# Patient Record
Sex: Female | Born: 1950 | Race: White | Hispanic: No | Marital: Married | Smoking: Never smoker
Health system: Southern US, Community
[De-identification: ages and names within clinical notes are randomized; demographics above are authoritative.]

## PROBLEM LIST (undated history)

## (undated) DIAGNOSIS — I10 Essential (primary) hypertension: Secondary | ICD-10-CM

## (undated) DIAGNOSIS — E785 Hyperlipidemia, unspecified: Secondary | ICD-10-CM

## (undated) DIAGNOSIS — G43909 Migraine, unspecified, not intractable, without status migrainosus: Secondary | ICD-10-CM

## (undated) DIAGNOSIS — U071 COVID-19: Secondary | ICD-10-CM

## (undated) DIAGNOSIS — E119 Type 2 diabetes mellitus without complications: Secondary | ICD-10-CM

## (undated) DIAGNOSIS — J45909 Unspecified asthma, uncomplicated: Secondary | ICD-10-CM

## (undated) DIAGNOSIS — J40 Bronchitis, not specified as acute or chronic: Secondary | ICD-10-CM

## (undated) HISTORY — PX: NO PAST SURGERIES: SHX2092

## (undated) HISTORY — PX: CHOLECYSTECTOMY: SHX55

---

## 2004-01-04 ENCOUNTER — Emergency Department: Payer: Self-pay | Admitting: Emergency Medicine

## 2004-03-28 ENCOUNTER — Emergency Department: Payer: Self-pay | Admitting: Emergency Medicine

## 2004-03-30 ENCOUNTER — Emergency Department: Payer: Self-pay | Admitting: General Practice

## 2004-08-17 ENCOUNTER — Other Ambulatory Visit: Payer: Self-pay

## 2004-08-17 ENCOUNTER — Inpatient Hospital Stay: Payer: Self-pay | Admitting: Internal Medicine

## 2004-08-18 ENCOUNTER — Other Ambulatory Visit: Payer: Self-pay

## 2004-08-24 ENCOUNTER — Ambulatory Visit: Payer: Self-pay | Admitting: Cardiovascular Disease

## 2005-04-29 ENCOUNTER — Emergency Department: Payer: Self-pay | Admitting: Emergency Medicine

## 2005-05-07 ENCOUNTER — Ambulatory Visit: Payer: Self-pay | Admitting: Unknown Physician Specialty

## 2005-10-14 ENCOUNTER — Emergency Department: Payer: Self-pay | Admitting: General Practice

## 2005-11-03 ENCOUNTER — Emergency Department: Payer: Self-pay | Admitting: Emergency Medicine

## 2006-08-02 ENCOUNTER — Inpatient Hospital Stay: Payer: Self-pay | Admitting: Internal Medicine

## 2006-08-04 ENCOUNTER — Other Ambulatory Visit: Payer: Self-pay

## 2006-08-14 ENCOUNTER — Ambulatory Visit: Payer: Self-pay | Admitting: Internal Medicine

## 2006-08-16 ENCOUNTER — Ambulatory Visit: Payer: Self-pay | Admitting: Internal Medicine

## 2006-08-29 ENCOUNTER — Ambulatory Visit: Payer: Self-pay | Admitting: Internal Medicine

## 2006-10-26 ENCOUNTER — Emergency Department: Payer: Self-pay | Admitting: Emergency Medicine

## 2006-12-14 ENCOUNTER — Emergency Department: Payer: Self-pay | Admitting: Emergency Medicine

## 2007-02-02 ENCOUNTER — Emergency Department: Payer: Self-pay | Admitting: Internal Medicine

## 2007-06-05 ENCOUNTER — Other Ambulatory Visit: Payer: Self-pay

## 2007-06-05 ENCOUNTER — Ambulatory Visit: Payer: Self-pay | Admitting: Ophthalmology

## 2007-06-06 ENCOUNTER — Ambulatory Visit: Payer: Self-pay | Admitting: Ophthalmology

## 2007-09-26 ENCOUNTER — Emergency Department: Payer: Self-pay | Admitting: Emergency Medicine

## 2007-10-14 ENCOUNTER — Emergency Department: Payer: Self-pay | Admitting: Emergency Medicine

## 2008-01-05 ENCOUNTER — Emergency Department: Payer: Self-pay | Admitting: Emergency Medicine

## 2008-01-10 ENCOUNTER — Emergency Department: Payer: Self-pay | Admitting: Emergency Medicine

## 2008-04-19 ENCOUNTER — Emergency Department: Payer: Self-pay | Admitting: Emergency Medicine

## 2008-05-09 ENCOUNTER — Emergency Department: Payer: Self-pay | Admitting: Emergency Medicine

## 2008-05-25 ENCOUNTER — Emergency Department: Payer: Self-pay | Admitting: Unknown Physician Specialty

## 2008-09-11 ENCOUNTER — Emergency Department: Payer: Self-pay | Admitting: Internal Medicine

## 2008-12-18 ENCOUNTER — Emergency Department: Payer: Self-pay | Admitting: Emergency Medicine

## 2009-10-27 ENCOUNTER — Emergency Department: Payer: Self-pay | Admitting: Emergency Medicine

## 2009-12-25 ENCOUNTER — Emergency Department: Payer: Self-pay | Admitting: Emergency Medicine

## 2009-12-31 ENCOUNTER — Emergency Department: Payer: Self-pay | Admitting: Emergency Medicine

## 2010-05-30 DEATH — deceased

## 2010-06-26 ENCOUNTER — Emergency Department: Payer: Self-pay | Admitting: Emergency Medicine

## 2010-09-10 ENCOUNTER — Emergency Department: Payer: Self-pay | Admitting: Emergency Medicine

## 2010-11-28 ENCOUNTER — Emergency Department: Payer: Self-pay | Admitting: Emergency Medicine

## 2010-12-16 ENCOUNTER — Emergency Department: Payer: Self-pay | Admitting: Emergency Medicine

## 2011-11-13 ENCOUNTER — Emergency Department: Payer: Self-pay | Admitting: Emergency Medicine

## 2011-11-13 LAB — COMPREHENSIVE METABOLIC PANEL
Albumin: 3.3 g/dL — ABNORMAL LOW (ref 3.4–5.0)
Alkaline Phosphatase: 131 U/L (ref 50–136)
Anion Gap: 10 (ref 7–16)
BUN: 20 mg/dL — ABNORMAL HIGH (ref 7–18)
Bilirubin,Total: 0.2 mg/dL (ref 0.2–1.0)
Calcium, Total: 9.3 mg/dL (ref 8.5–10.1)
Chloride: 100 mmol/L (ref 98–107)
Co2: 25 mmol/L (ref 21–32)
EGFR (African American): >60
EGFR (Non-African Amer.): >60
Glucose: 382 mg/dL — ABNORMAL HIGH (ref 65–99)
Osmolality: 288 (ref 275–301)
SGOT(AST): 67 U/L — ABNORMAL HIGH (ref 15–37)
Sodium: 135 mmol/L — ABNORMAL LOW (ref 136–145)
Total Protein: 7.3 g/dL (ref 6.4–8.2)

## 2011-11-13 LAB — LIPASE, BLOOD: Lipase: 172 U/L (ref 73–393)

## 2011-11-14 LAB — URINALYSIS, COMPLETE
Blood: NEGATIVE
Ph: 6 (ref 4.5–8.0)
Protein: 30
Squamous Epithelial: 1
WBC UR: 34 /HPF (ref 0–5)

## 2011-11-14 LAB — CBC
HCT: 45.3 % (ref 35.0–47.0)
MCH: 31.8 pg (ref 26.0–34.0)
MCHC: 34.4 g/dL (ref 32.0–36.0)
MCV: 92 fL (ref 80–100)
Platelet: 249 10*3/uL (ref 150–440)
RDW: 12.6 % (ref 11.5–14.5)

## 2012-01-31 ENCOUNTER — Emergency Department: Payer: Self-pay | Admitting: Emergency Medicine

## 2012-01-31 LAB — COMPREHENSIVE METABOLIC PANEL
Albumin: 3.4 g/dL (ref 3.4–5.0)
Anion Gap: 8 (ref 7–16)
BUN: 10 mg/dL (ref 7–18)
Calcium, Total: 9.3 mg/dL (ref 8.5–10.1)
Chloride: 99 mmol/L (ref 98–107)
Co2: 26 mmol/L (ref 21–32)
EGFR (African American): >60
Osmolality: 278 (ref 275–301)
Potassium: 4 mmol/L (ref 3.5–5.1)
SGOT(AST): 119 U/L — ABNORMAL HIGH (ref 15–37)
Sodium: 133 mmol/L — ABNORMAL LOW (ref 136–145)

## 2012-10-24 ENCOUNTER — Emergency Department: Payer: Self-pay | Admitting: Emergency Medicine

## 2012-10-24 LAB — URINALYSIS, COMPLETE
Bilirubin,UR: NEGATIVE
Blood: NEGATIVE
Glucose,UR: 50 mg/dL (ref 0–75)
Ketone: NEGATIVE
Nitrite: NEGATIVE
Protein: 30
Specific Gravity: 1.023 (ref 1.003–1.030)
Squamous Epithelial: 6

## 2013-10-01 ENCOUNTER — Emergency Department: Payer: Self-pay | Admitting: Emergency Medicine

## 2013-10-01 LAB — CBC WITH DIFFERENTIAL/PLATELET
BASOS ABS: 0.1 10*3/uL (ref 0.0–0.1)
BASOS PCT: 0.8 %
Eosinophil #: 0.6 10*3/uL (ref 0.0–0.7)
Eosinophil %: 5 %
HCT: 39.8 % (ref 35.0–47.0)
HGB: 13.1 g/dL (ref 12.0–16.0)
LYMPHS PCT: 25.4 %
Lymphocyte #: 2.9 10*3/uL (ref 1.0–3.6)
MCH: 30.3 pg (ref 26.0–34.0)
MCHC: 32.8 g/dL (ref 32.0–36.0)
MCV: 92 fL (ref 80–100)
MONO ABS: 0.8 x10 3/mm (ref 0.2–0.9)
Monocyte %: 7.2 %
Neutrophil #: 7 10*3/uL — ABNORMAL HIGH (ref 1.4–6.5)
Neutrophil %: 61.6 %
PLATELETS: 268 10*3/uL (ref 150–440)
RBC: 4.31 10*6/uL (ref 3.80–5.20)
RDW: 13.1 % (ref 11.5–14.5)
WBC: 11.4 10*3/uL — ABNORMAL HIGH (ref 3.6–11.0)

## 2013-10-01 LAB — BASIC METABOLIC PANEL
Anion Gap: 10 (ref 7–16)
BUN: 12 mg/dL (ref 7–18)
CO2: 22 mmol/L (ref 21–32)
Calcium, Total: 9 mg/dL (ref 8.5–10.1)
Chloride: 106 mmol/L (ref 98–107)
Creatinine: 1.08 mg/dL (ref 0.60–1.30)
EGFR (Non-African Amer.): 55 — ABNORMAL LOW
Glucose: 187 mg/dL — ABNORMAL HIGH (ref 65–99)
OSMOLALITY: 280 (ref 275–301)
POTASSIUM: 3.8 mmol/L (ref 3.5–5.1)
SODIUM: 138 mmol/L (ref 136–145)

## 2013-10-01 LAB — TROPONIN I: Troponin-I: <0.02 ng/mL

## 2013-10-02 LAB — URINALYSIS, COMPLETE
BLOOD: NEGATIVE
Bilirubin,UR: NEGATIVE
Glucose,UR: NEGATIVE mg/dL (ref 0–75)
Ketone: NEGATIVE
Nitrite: NEGATIVE
Ph: 5 (ref 4.5–8.0)
Protein: 100
Specific Gravity: 1.029 (ref 1.003–1.030)
Squamous Epithelial: 46
WBC UR: 66 /HPF (ref 0–5)

## 2013-10-02 LAB — TROPONIN I: Troponin-I: <0.02 ng/mL

## 2014-03-06 ENCOUNTER — Ambulatory Visit: Payer: Self-pay | Admitting: Ophthalmology

## 2014-03-06 LAB — POTASSIUM: Potassium: 4.3 mmol/L (ref 3.5–5.1)

## 2014-03-18 ENCOUNTER — Ambulatory Visit: Payer: Self-pay | Admitting: Ophthalmology

## 2014-06-29 NOTE — Op Note (Signed)
PATIENT NAME:  [name], Wendy Summers DATE OF BIRTH:  12-May-1950  DATE OF PROCEDURE:  03/18/2014  PREOPERATIVE DIAGNOSIS:  Nuclear sclerotic cataract of the left eye.   POSTOPERATIVE DIAGNOSIS:  Nuclear sclerotic cataract of the left eye.   OPERATIVE PROCEDURE:  Cataract extraction by phacoemulsification with implant of intraocular lens to left eye.   SURGEON:  Galen ManilaWilliam Shandee Jergens, MD.   ANESTHESIA:  1. Managed anesthesia care.  2. Topical tetracaine drops followed by 2% Xylocaine jelly applied in the preoperative holding area.   COMPLICATIONS:  None.   TECHNIQUE:   Stop and chop.  DESCRIPTION OF PROCEDURE:  The patient was examined and consented in the preoperative holding area where the aforementioned topical anesthesia was applied to the left eye and then brought back to the Operating Room where the left eye was prepped and draped in the usual sterile ophthalmic fashion and a lid speculum was placed. A paracentesis was created with the side port blade and the anterior chamber was filled with viscoelastic. A near clear corneal incision was performed with the steel keratome. A continuous curvilinear capsulorrhexis was performed with a cystotome followed by the capsulorrhexis forceps. Hydrodissection and hydrodelineation were carried out with BSS on a blunt cannula. The lens was removed in a stop and chop technique and the remaining cortical material was removed with the irrigation-aspiration handpiece. The capsular bag was inflated with viscoelastic and the Tecnis ZCB00, 31.5-diopter lens, serial number [id number] was placed in the capsular bag without complication. The remaining viscoelastic was removed from the eye with the irrigation-aspiration handpiece. The wounds were hydrated. The anterior chamber was flushed with Miostat and the eye was inflated to physiologic pressure. 0.1 mL of cefuroxime concentration 10 mg/mL was placed in the anterior chamber. The wounds were found to be water  tight. The eye was dressed with Vigamox. The patient was given protective glasses to wear throughout the day and a shield with which to sleep tonight. The patient was also given drops with which to begin a drop regimen today and will follow-up with me in one day.    ____________________________ Wendy Summers. Kaiser Belluomini, MD wlp:TT D: 03/18/2014 20:53:49 ET T: 03/18/2014 23:07:53 ET JOB#: [id number]  cc: Britton Perkinson Summers. Eular Panek, MD, <Dictator> Wendy Summers Evanee Lubrano MD ELECTRONICALLY SIGNED 03/19/2014 16:10

## 2015-03-10 ENCOUNTER — Emergency Department: Payer: Medicaid Other

## 2015-03-10 ENCOUNTER — Emergency Department
Admission: EM | Admit: 2015-03-10 | Discharge: 2015-03-10 | Disposition: A | Discharge status: Home / Self Care | Payer: Medicaid Other | Attending: Emergency Medicine | Admitting: Emergency Medicine

## 2015-03-10 ENCOUNTER — Encounter: Payer: Self-pay | Admitting: *Deleted

## 2015-03-10 DIAGNOSIS — J45901 Unspecified asthma with (acute) exacerbation: Secondary | ICD-10-CM | POA: Insufficient documentation

## 2015-03-10 DIAGNOSIS — R05 Cough: Secondary | ICD-10-CM | POA: Diagnosis present

## 2015-03-10 DIAGNOSIS — J209 Acute bronchitis, unspecified: Secondary | ICD-10-CM

## 2015-03-10 DIAGNOSIS — E119 Type 2 diabetes mellitus without complications: Secondary | ICD-10-CM | POA: Insufficient documentation

## 2015-03-10 HISTORY — DX: Unspecified asthma, uncomplicated: J45.909

## 2015-03-10 HISTORY — DX: Type 2 diabetes mellitus without complications: E11.9

## 2015-03-10 LAB — CBC
HCT: 38 % (ref 35.0–47.0)
HEMOGLOBIN: 12.5 g/dL (ref 12.0–16.0)
MCH: 30 pg (ref 26.0–34.0)
MCHC: 32.9 g/dL (ref 32.0–36.0)
MCV: 91.1 fL (ref 80.0–100.0)
PLATELETS: 241 10*3/uL (ref 150–440)
RBC: 4.17 MIL/uL (ref 3.80–5.20)
RDW: 13.9 % (ref 11.5–14.5)
WBC: 12.1 10*3/uL — ABNORMAL HIGH (ref 3.6–11.0)

## 2015-03-10 LAB — COMPREHENSIVE METABOLIC PANEL
ALBUMIN: 4.3 g/dL (ref 3.5–5.0)
ALK PHOS: 91 U/L (ref 38–126)
ALT: 16 U/L (ref 14–54)
ANION GAP: 10 (ref 5–15)
AST: 14 U/L — ABNORMAL LOW (ref 15–41)
BILIRUBIN TOTAL: 0.6 mg/dL (ref 0.3–1.2)
BUN: 22 mg/dL — AB (ref 6–20)
CALCIUM: 9.3 mg/dL (ref 8.9–10.3)
CO2: 24 mmol/L (ref 22–32)
CREATININE: 0.93 mg/dL (ref 0.44–1.00)
Chloride: 104 mmol/L (ref 101–111)
GFR calc Af Amer: >60 mL/min (ref >60)
GFR calc non Af Amer: >60 mL/min (ref >60)
GLUCOSE: 220 mg/dL — AB (ref 65–99)
Potassium: 4.4 mmol/L (ref 3.5–5.1)
Sodium: 138 mmol/L (ref 135–145)
TOTAL PROTEIN: 7.4 g/dL (ref 6.5–8.1)

## 2015-03-10 LAB — LIPASE, BLOOD: Lipase: 27 U/L (ref 11–51)

## 2015-03-10 MED ORDER — AZITHROMYCIN 250 MG PO TABS: ORAL_TABLET | ORAL | Status: DC

## 2015-03-10 MED ORDER — DEXAMETHASONE SODIUM PHOSPHATE 10 MG/ML IJ SOLN
10.0000 mg | Freq: Once | INTRAMUSCULAR | Status: AC
  Administered 2015-03-10: 10 mg via INTRAMUSCULAR
  Filled 2015-03-10: qty 1

## 2015-03-10 MED ORDER — RANITIDINE HCL 150 MG PO CAPS: 150.0000 mg | ORAL_CAPSULE | Freq: Two times a day (BID) | ORAL | Status: DC

## 2015-03-10 MED ORDER — AZITHROMYCIN 250 MG PO TABS
500.0000 mg | ORAL_TABLET | Freq: Once | ORAL | Status: AC
  Administered 2015-03-10: 500 mg via ORAL
  Filled 2015-03-10: qty 2

## 2015-03-10 MED ORDER — PROMETHAZINE HCL 25 MG PO TABS: 25.0000 mg | ORAL_TABLET | Freq: Four times a day (QID) | ORAL | Status: DC | PRN

## 2015-03-10 MED ORDER — IPRATROPIUM-ALBUTEROL 0.5-2.5 (3) MG/3ML IN SOLN
3.0000 mL | Freq: Once | RESPIRATORY_TRACT | Status: AC
  Administered 2015-03-10: 3 mL via RESPIRATORY_TRACT
  Filled 2015-03-10: qty 3

## 2015-03-10 MED ORDER — ACETAMINOPHEN 500 MG PO TABS
1000.0000 mg | ORAL_TABLET | Freq: Once | ORAL | Status: AC
  Administered 2015-03-10: 1000 mg via ORAL
  Filled 2015-03-10: qty 2

## 2015-03-10 MED ORDER — ONDANSETRON 8 MG PO TBDP
8.0000 mg | ORAL_TABLET | Freq: Once | ORAL | Status: AC
  Administered 2015-03-10: 8 mg via ORAL
  Filled 2015-03-10: qty 1

## 2015-03-10 NOTE — ED Provider Notes (Addendum)
[facility] Emergency Department Provider Note  ____________________________________________  Time seen: 4:45 PM  I have reviewed the triage vital signs and the nursing notes.   HISTORY  Chief Complaint Cough; Nausea; and Shortness of Breath    HPI Wendy Summers is a 65 y.o. female who reports she was in her usual state of health last night when she went to bed when she woke up this morning with a gradual onset headache. She also has some shortness of breath as well as chest wall discomfort and nonproductive cough. She feels tired and nauseated as well. Only medical history is asthma and diabetes that is controlled with oral hypoglycemics. She checks her blood sugar at home. Denies fever or diarrhea abdominal pain or significant chest pain. No dizziness or syncope.     Past Medical History  Diagnosis Date  . Diabetes mellitus without complication (HCC)   . Asthma      There are no active problems to display for this patient.    History reviewed. No pertinent past surgical history.   Current Outpatient Rx  Name  Route  Sig  Dispense  Refill  . azithromycin (ZITHROMAX Z-PAK) 250 MG tablet      Take 2 tablets (500 mg) on  Day 1,  followed by 1 tablet (250 mg) once daily on Days 2 through 5.   6 each   0   . promethazine (PHENERGAN) 25 MG tablet   Oral   Take 1 tablet (25 mg total) by mouth every 6 (six) hours as needed for nausea or vomiting.   15 tablet   0   . ranitidine (ZANTAC) 150 MG capsule   Oral   Take 1 capsule (150 mg total) by mouth 2 (two) times daily.   28 capsule   0      Allergies Review of patient's allergies indicates no known allergies.   History reviewed. No pertinent family history.  Social History Social History  Substance Use Topics  . Smoking status: Never Smoker   . Smokeless tobacco: None  . Alcohol Use: None    Review of Systems  Constitutional:   No fever or chills. No weight changes Eyes:   No  blurry vision or double vision.  ENT:   Positive sore throat. Cardiovascular:   No chest pain. Respiratory:   Positive shortness of breath and nonproductive cough Gastrointestinal:   Negative for abdominal pain, vomiting and diarrhea.  No BRBPR or melena. Genitourinary:   Negative for dysuria, urinary retention, bloody urine, or difficulty urinating. Musculoskeletal:   Negative for back pain. No joint swelling or pain. Skin:   Negative for rash. Neurological:   Negative for headaches, focal weakness or numbness. Psychiatric:  No anxiety or depression.   Endocrine:  No hot/cold intolerance, changes in energy, or sleep difficulty.  10-point ROS otherwise negative.  ____________________________________________   PHYSICAL EXAM:  VITAL SIGNS: ED Triage Vitals  Enc Vitals Group     BP 03/10/15 1526 159/80 mmHg     Pulse Rate 03/10/15 1526 87     Resp 03/10/15 1526 18     Temp 03/10/15 1526 97.6 F (36.4 C)     Temp Source 03/10/15 1526 Oral     SpO2 03/10/15 1526 96 %     Weight 03/10/15 1526 210 lb (95.255 kg)     Height 03/10/15 1526 5\' 2"  (1.575 m)     Head Cir --      Peak Flow --  Pain Score 03/10/15 1527 10     Pain Loc --      Pain Edu? --      Excl. in GC? --     Vital signs reviewed, nursing assessments reviewed.   Constitutional:   Alert and oriented. Well appearing and in no distress. Eyes:   No scleral icterus. No conjunctival pallor. PERRL. EOMI ENT   Head:   Normocephalic and atraumatic.   Nose:   No congestion/rhinnorhea. No septal hematoma   Mouth/Throat:   MMM, positive pharyngeal erythema. No peritonsillar mass. No uvula shift.   Neck:   No stridor. No SubQ emphysema. No meningismus. Hematological/Lymphatic/Immunilogical:   No cervical lymphadenopathy. Cardiovascular:   RRR. Normal and symmetric distal pulses are present in all extremities. No murmurs, rubs, or gallops. Respiratory:   Normal respiratory effort without tachypnea nor  retractions. Diffuse expiratory wheezing with normal expiratory phase Gastrointestinal:   Soft and nontender. No distention. There is no CVA tenderness.  No rebound, rigidity, or guarding. Genitourinary:   deferred Musculoskeletal:   Nontender with normal range of motion in all extremities. No joint effusions.  No lower extremity tenderness.  No edema. There is tenderness to palpation along the anterior chest wall particularly over the sternum which reproduces the chest wall pain that she had previously. Neurologic:   Normal speech and language.  CN 2-10 normal. Motor grossly intact. No pronator drift.  Normal gait. No gross focal neurologic deficits are appreciated.  Skin:    Skin is warm, dry and intact. No rash noted.  No petechiae, purpura, or bullae. Psychiatric:   Mood and affect are normal. Speech and behavior are normal. Patient exhibits appropriate insight and judgment.  ____________________________________________    LABS (pertinent positives/negatives) (all labs ordered are listed, but only abnormal results are displayed) Labs Reviewed  COMPREHENSIVE METABOLIC PANEL - Abnormal; Notable for the following:    Glucose, Bld 220 (*)    BUN 22 (*)    AST 14 (*)    All other components within normal limits  CBC - Abnormal; Notable for the following:    WBC 12.1 (*)    All other components within normal limits  LIPASE, BLOOD   ____________________________________________   EKG  Interpreted by me Normal sinus rhythm rate of 87, normal axis and intervals. Incomplete right bundle-branch block. Normal ST segments and T waves.  ____________________________________________    RADIOLOGY  Chest x-ray unremarkable except for airway changes consistent with bronchitis  ____________________________________________   PROCEDURES   ____________________________________________   INITIAL IMPRESSION / ASSESSMENT AND PLAN / ED COURSE  Pertinent labs & imaging results that were  available during my care of the patient were reviewed by me and considered in my medical decision making (see chart for details).  Patient presents with constellation of symptoms consistent with viral respiratory illness and acute bronchitis. Considering the patient's symptoms, medical history, and physical examination today, I have low suspicion for ACS, PE, TAD, pneumothorax, carditis, mediastinitis, pneumonia, CHF, or sepsis.  We'll give Decadron now. Counseled the patient to continue monitoring her blood sugar at home with her diabetes. We'll also give him a DuoNeb as well as Tylenol and Zofran. I'll prescribe a course of azithromycin and antacids and anti-emetics and have her follow up with primary care.         ____________________________________________   FINAL CLINICAL IMPRESSION(S) / ED DIAGNOSES  Final diagnoses:  Acute bronchitis, unspecified organism     Sharman Cheek, MD 03/10/15 1656

## 2015-03-10 NOTE — Discharge Instructions (Signed)

## 2015-03-10 NOTE — ED Notes (Signed)
MD Stafford at bedside. 

## 2015-03-10 NOTE — ED Notes (Signed)
Pt states cough, nausea, headache, states she feels SOB when she lies down, started this AM

## 2015-03-27 ENCOUNTER — Other Ambulatory Visit: Payer: Self-pay | Admitting: Family Medicine

## 2015-03-27 ENCOUNTER — Ambulatory Visit
Admission: RE | Admit: 2015-03-27 | Discharge: 2015-03-27 | Disposition: A | Discharge status: Home / Self Care | Payer: Medicaid Other | Source: Ambulatory Visit | Attending: Family Medicine | Admitting: Family Medicine

## 2015-03-27 DIAGNOSIS — M25579 Pain in unspecified ankle and joints of unspecified foot: Secondary | ICD-10-CM

## 2015-03-27 DIAGNOSIS — M25571 Pain in right ankle and joints of right foot: Secondary | ICD-10-CM | POA: Diagnosis not present

## 2015-07-27 ENCOUNTER — Encounter: Payer: Self-pay | Admitting: Emergency Medicine

## 2015-07-27 ENCOUNTER — Emergency Department
Admission: EM | Admit: 2015-07-27 | Discharge: 2015-07-27 | Disposition: A | Discharge status: Home / Self Care | Payer: Medicaid Other | Attending: Emergency Medicine | Admitting: Emergency Medicine

## 2015-07-27 DIAGNOSIS — N390 Urinary tract infection, site not specified: Secondary | ICD-10-CM | POA: Diagnosis not present

## 2015-07-27 DIAGNOSIS — R55 Syncope and collapse: Secondary | ICD-10-CM | POA: Insufficient documentation

## 2015-07-27 DIAGNOSIS — E860 Dehydration: Secondary | ICD-10-CM | POA: Diagnosis not present

## 2015-07-27 DIAGNOSIS — R42 Dizziness and giddiness: Secondary | ICD-10-CM | POA: Diagnosis present

## 2015-07-27 DIAGNOSIS — J45909 Unspecified asthma, uncomplicated: Secondary | ICD-10-CM | POA: Insufficient documentation

## 2015-07-27 DIAGNOSIS — R739 Hyperglycemia, unspecified: Secondary | ICD-10-CM

## 2015-07-27 DIAGNOSIS — E1165 Type 2 diabetes mellitus with hyperglycemia: Secondary | ICD-10-CM | POA: Diagnosis not present

## 2015-07-27 LAB — CBC
HCT: 37 % (ref 35.0–47.0)
Hemoglobin: 12.5 g/dL (ref 12.0–16.0)
MCH: 30.8 pg (ref 26.0–34.0)
MCHC: 33.7 g/dL (ref 32.0–36.0)
MCV: 91.6 fL (ref 80.0–100.0)
PLATELETS: 262 10*3/uL (ref 150–440)
RBC: 4.04 MIL/uL (ref 3.80–5.20)
RDW: 13.6 % (ref 11.5–14.5)
WBC: 9.5 10*3/uL (ref 3.6–11.0)

## 2015-07-27 LAB — URINALYSIS COMPLETE WITH MICROSCOPIC (ARMC ONLY)
BILIRUBIN URINE: NEGATIVE
GLUCOSE, UA: 150 mg/dL — AB
Hgb urine dipstick: NEGATIVE
Ketones, ur: NEGATIVE mg/dL
Nitrite: NEGATIVE
Protein, ur: NEGATIVE mg/dL
Specific Gravity, Urine: 1.015 (ref 1.005–1.030)
pH: 5 (ref 5.0–8.0)

## 2015-07-27 LAB — BASIC METABOLIC PANEL
Anion gap: 11 (ref 5–15)
BUN: 47 mg/dL — AB (ref 6–20)
CALCIUM: 9.1 mg/dL (ref 8.9–10.3)
CHLORIDE: 108 mmol/L (ref 101–111)
CO2: 17 mmol/L — AB (ref 22–32)
CREATININE: 1.58 mg/dL — AB (ref 0.44–1.00)
GFR calc non Af Amer: 34 mL/min — ABNORMAL LOW (ref >60)
GFR, EST AFRICAN AMERICAN: 39 mL/min — AB (ref >60)
Glucose, Bld: 343 mg/dL — ABNORMAL HIGH (ref 65–99)
Potassium: 4.1 mmol/L (ref 3.5–5.1)
SODIUM: 136 mmol/L (ref 135–145)

## 2015-07-27 LAB — GLUCOSE, CAPILLARY: GLUCOSE-CAPILLARY: 92 mg/dL (ref 65–99)

## 2015-07-27 MED ORDER — CEPHALEXIN 500 MG PO CAPS: 500.0000 mg | ORAL_CAPSULE | Freq: Three times a day (TID) | ORAL | Status: DC

## 2015-07-27 MED ORDER — CEPHALEXIN 500 MG PO CAPS
500.0000 mg | ORAL_CAPSULE | Freq: Once | ORAL | Status: AC
  Administered 2015-07-27: 500 mg via ORAL
  Filled 2015-07-27: qty 1

## 2015-07-27 MED ORDER — INSULIN ASPART 100 UNIT/ML ~~LOC~~ SOLN
6.0000 [IU] | Freq: Once | SUBCUTANEOUS | Status: AC
  Administered 2015-07-27: 6 [IU] via INTRAVENOUS
  Filled 2015-07-27: qty 6

## 2015-07-27 MED ORDER — SODIUM CHLORIDE 0.9 % IV BOLUS (SEPSIS)
1000.0000 mL | Freq: Once | INTRAVENOUS | Status: AC
  Administered 2015-07-27: 1000 mL via INTRAVENOUS

## 2015-07-27 NOTE — Discharge Instructions (Signed)
Dehydration, Adult °Dehydration means your body does not have as much fluid or water as it needs. It happens when you take in less fluid than you lose. Your kidneys, brain, and heart will not work properly without the right amount of fluids.  °Dehydration can range from mild to severe. It should be treated right away to help prevent it from becoming severe. °HOME CARE °· Drink enough fluid to keep your pee (urine) clear or pale yellow. °· Drink water or fluid slowly by taking small sips. You can also try sucking on ice cubes. °· Have food or drinks that contain electrolytes. Examples include bananas and sports drinks. °· Take over-the-counter and prescription medicines only as told by your doctor. °· Prepare oral rehydration solution (ORS) according to the instructions that came with it. Take sips of ORS every 5 minutes until your pee returns to normal. °· If you are throwing up (vomiting) or have watery poop (diarrhea), keep trying to drink water, ORS, or both. °· If you have watery poop, avoid: °¨ Drinks with caffeine. °¨ Fruit juice. °¨ Milk. °¨ Carbonated soft drinks. °· Do not take salt tablets. This can lead to having too much sodium in your body (hypernatremia). °GET HELP IF: °· You cannot eat or drink without throwing up. °· You have had mild watery poop for longer than 24 hours. °· You have a fever. °GET HELP RIGHT AWAY IF:  °· You have very strong thirst. °· You have very bad watery poop. °· You have not peed in 6-8 hours, or you have peed only a small amount of very dark pee. °· You have shriveled skin. °· You are dizzy, confused, or both. °  °This information is not intended to replace advice given to you by your health care provider. Make sure you discuss any questions you have with your health care provider. °  °Document Released: 12/11/2008 Document Revised: 11/05/2014 Document Reviewed: 07/02/2014 °Elsevier Interactive Patient Education ©2016 [employer] ° ° °Urinary Tract Infection °A urinary  tract infection (UTI) can occur any place along the urinary tract. The tract includes the kidneys, ureters, bladder, and urethra. A type of germ called bacteria often causes a UTI. UTIs are often helped with antibiotic medicine.  °HOME CARE  °· If given, take antibiotics as told by your doctor. Finish them even if you start to feel better. °· Drink enough fluids to keep your pee (urine) clear or pale yellow. °· Avoid tea, drinks with caffeine, and bubbly (carbonated) drinks. °· Pee often. Avoid holding your pee in for a long time. °· Pee before and after having sex (intercourse). °· Wipe from front to back after you poop (bowel movement) if you are a woman. Use each tissue only once. °GET HELP RIGHT AWAY IF:  °· You have back pain. °· You have lower belly (abdominal) pain. °· You have chills. °· You feel sick to your stomach (nauseous). °· You throw up (vomit). °· Your burning or discomfort with peeing does not go away. °· You have a fever. °· Your symptoms are not better in 3 days. °MAKE SURE YOU:  °· Understand these instructions. °· Will watch your condition. °· Will get help right away if you are not doing well or get worse. °  °This information is not intended to replace advice given to you by your health care provider. Make sure you discuss any questions you have with your health care provider. °  °Document Released: 08/03/2007 Document Revised: 03/07/2014 Document Reviewed: 09/15/2011 °Elsevier   Interactive Patient Education ©2016 [employer] ° °

## 2015-07-27 NOTE — ED Notes (Signed)
Pt to ed with c/o dizziness that started after having neck pain and then diaphoresis.  Pt states started at 2 pm today.

## 2015-07-27 NOTE — ED Provider Notes (Signed)
[facility] Emergency Department Provider Note  Time seen: 5:23 PM  I have reviewed the triage vital signs and the nursing notes.   HISTORY  Chief Complaint Dizziness    HPI Wendy Summers is a 65 y.o. female with a past medical history of diabetes, asthma presents the emergency department with near syncope.  According to the patient she was using the restroom today, when she stood up she got very lightheaded and dizzy. Patient states she has been feeling lightheaded for the past 2 days or so. Denies any dysuria. Denies any chest pain or shortness of breath. Denies any fever, cough or congestion. Patient did state mild neck pain earlier but that has resolved.     Past Medical History  Diagnosis Date  . Diabetes mellitus without complication (HCC)   . Asthma     There are no active problems to display for this patient.   History reviewed. No pertinent past surgical history.  Current Outpatient Rx  Name  Route  Sig  Dispense  Refill  . azithromycin (ZITHROMAX Z-PAK) 250 MG tablet      Take 2 tablets (500 mg) on  Day 1,  followed by 1 tablet (250 mg) once daily on Days 2 through 5.   6 each   0   . promethazine (PHENERGAN) 25 MG tablet   Oral   Take 1 tablet (25 mg total) by mouth every 6 (six) hours as needed for nausea or vomiting.   15 tablet   0   . ranitidine (ZANTAC) 150 MG capsule   Oral   Take 1 capsule (150 mg total) by mouth 2 (two) times daily.   28 capsule   0     Allergies Review of patient's allergies indicates no known allergies.  History reviewed. No pertinent family history.  Social History Social History  Substance Use Topics  . Smoking status: Never Smoker   . Smokeless tobacco: None  . Alcohol Use: No    Review of Systems Constitutional: Negative for fever Cardiovascular: Negative for chest pain. Respiratory: Negative for shortness of breath. Gastrointestinal: Negative for abdominal pain. Negative for nausea,  vomiting. States frequent diarrhea but this is chronic. Genitourinary: Negative for dysuria. Musculoskeletal: Negative for back pain. Positive for mild dull neck pain which has resolved. Skin: Negative for rash. Neurological: Negative for headache 10-point ROS otherwise negative.  ____________________________________________   PHYSICAL EXAM:  VITAL SIGNS: ED Triage Vitals  Enc Vitals Group     BP 07/27/15 1436 140/49 mmHg     Pulse Rate 07/27/15 1436 82     Resp 07/27/15 1436 20     Temp 07/27/15 1436 97.9 F (36.6 C)     Temp Source 07/27/15 1436 Oral     SpO2 07/27/15 1436 97 %     Weight 07/27/15 1436 210 lb (95.255 kg)     Height 07/27/15 1436 5\' 2"  (1.575 m)     Head Cir --      Peak Flow --      Pain Score 07/27/15 1436 0     Pain Loc --      Pain Edu? --      Excl. in GC? --     Constitutional: Alert and oriented. Well appearing and in no distress. Eyes: Normal exam ENT   Head: Normocephalic and atraumatic.   Mouth/Throat: Mucous membranes are moist. Cardiovascular: Normal rate, regular rhythm. No murmur Respiratory: Normal respiratory effort without tachypnea nor retractions. Breath sounds are clear  Gastrointestinal: Soft and nontender. No distention.   Musculoskeletal: Nontender with normal range of motion in all extremities. Neurologic:  Normal speech and language. No gross focal neurologic deficits  Skin:  Skin is warm, dry and intact.  Psychiatric: Mood and affect are normal.   ____________________________________________    EKG  EKG reviewed and interpreted by myself shows normal sinus rhythm at 80 bpm, narrow QRS, normal axis, normal intervals, nonspecific but no concerning ST changes.  ____________________________________________    INITIAL IMPRESSION / ASSESSMENT AND PLAN / ED COURSE  Pertinent labs & imaging results that were available during my care of the patient were reviewed by me and considered in my medical decision making (see  chart for details).  Patient presents the emergency department with near syncope. States she has been feeling lightheaded over the past several days. Patient's labs have resulted showing hyperglycemia with a mild renal insufficiency likely reflecting dehydration, likely due to her hyperglycemia. Patient's urinalysis shows many bacteria, we will treat with Keflex, have added a urine culture to verify. We will also dose IV fluids as well as IV insulin, monitor the patient closely in the emergency department. Overall the patient appears well, denies any pain complaints. States she had mild neck pain earlier but that went away, has no tenderness to palpation. Overall appears well.  Blood glucose is decreased to 95. Patient states he feels much much better. We'll discharge home once fluids finished infusing. 10 days of Keflex and PCP follow-up. Patient agreeable to plan.  ____________________________________________   FINAL CLINICAL IMPRESSION(S) / ED DIAGNOSES  Hyperglycemia Near-syncope Dehydration UTI  Minna Antis, MD 07/27/15 1610

## 2016-07-12 ENCOUNTER — Encounter: Payer: Self-pay | Admitting: *Deleted

## 2016-07-12 DIAGNOSIS — R0902 Hypoxemia: Secondary | ICD-10-CM | POA: Diagnosis present

## 2016-07-12 DIAGNOSIS — Z79899 Other long term (current) drug therapy: Secondary | ICD-10-CM

## 2016-07-12 DIAGNOSIS — E860 Dehydration: Secondary | ICD-10-CM | POA: Diagnosis present

## 2016-07-12 DIAGNOSIS — N183 Chronic kidney disease, stage 3 (moderate): Secondary | ICD-10-CM | POA: Diagnosis present

## 2016-07-12 DIAGNOSIS — I129 Hypertensive chronic kidney disease with stage 1 through stage 4 chronic kidney disease, or unspecified chronic kidney disease: Secondary | ICD-10-CM | POA: Diagnosis present

## 2016-07-12 DIAGNOSIS — B9620 Unspecified Escherichia coli [E. coli] as the cause of diseases classified elsewhere: Secondary | ICD-10-CM | POA: Diagnosis present

## 2016-07-12 DIAGNOSIS — T380X5A Adverse effect of glucocorticoids and synthetic analogues, initial encounter: Secondary | ICD-10-CM | POA: Diagnosis not present

## 2016-07-12 DIAGNOSIS — E1122 Type 2 diabetes mellitus with diabetic chronic kidney disease: Secondary | ICD-10-CM | POA: Diagnosis present

## 2016-07-12 DIAGNOSIS — N3001 Acute cystitis with hematuria: Secondary | ICD-10-CM | POA: Diagnosis present

## 2016-07-12 DIAGNOSIS — N179 Acute kidney failure, unspecified: Secondary | ICD-10-CM | POA: Diagnosis present

## 2016-07-12 DIAGNOSIS — J4521 Mild intermittent asthma with (acute) exacerbation: Principal | ICD-10-CM | POA: Diagnosis present

## 2016-07-12 DIAGNOSIS — E1165 Type 2 diabetes mellitus with hyperglycemia: Secondary | ICD-10-CM | POA: Diagnosis not present

## 2016-07-12 DIAGNOSIS — Z7984 Long term (current) use of oral hypoglycemic drugs: Secondary | ICD-10-CM

## 2016-07-12 NOTE — ED Triage Notes (Signed)
Pt complains of a cough for 3 days with fever, pt denies pain and any other symptoms

## 2016-07-13 ENCOUNTER — Emergency Department: Payer: Medicaid Other

## 2016-07-13 ENCOUNTER — Encounter: Payer: Self-pay | Admitting: Internal Medicine

## 2016-07-13 ENCOUNTER — Inpatient Hospital Stay
Admission: EM | Admit: 2016-07-13 | Discharge: 2016-07-18 | DRG: 202 | Disposition: A | Discharge status: Home / Self Care | Payer: Medicaid Other | Attending: Internal Medicine | Admitting: Internal Medicine

## 2016-07-13 DIAGNOSIS — J45909 Unspecified asthma, uncomplicated: Secondary | ICD-10-CM | POA: Diagnosis present

## 2016-07-13 DIAGNOSIS — R062 Wheezing: Secondary | ICD-10-CM

## 2016-07-13 DIAGNOSIS — J4521 Mild intermittent asthma with (acute) exacerbation: Secondary | ICD-10-CM

## 2016-07-13 DIAGNOSIS — J40 Bronchitis, not specified as acute or chronic: Secondary | ICD-10-CM

## 2016-07-13 DIAGNOSIS — R55 Syncope and collapse: Secondary | ICD-10-CM

## 2016-07-13 HISTORY — DX: Bronchitis, not specified as acute or chronic: J40

## 2016-07-13 LAB — TROPONIN I: Troponin I: <0.03 ng/mL (ref <0.03)

## 2016-07-13 LAB — URINALYSIS, COMPLETE (UACMP) WITH MICROSCOPIC
BILIRUBIN URINE: NEGATIVE
GLUCOSE, UA: NEGATIVE mg/dL
Hgb urine dipstick: NEGATIVE
Ketones, ur: NEGATIVE mg/dL
NITRITE: NEGATIVE
PH: 5 (ref 5.0–8.0)
Protein, ur: 30 mg/dL — AB
SPECIFIC GRAVITY, URINE: 1.017 (ref 1.005–1.030)

## 2016-07-13 LAB — CBC
HCT: 38.5 % (ref 35.0–47.0)
HEMOGLOBIN: 13 g/dL (ref 12.0–16.0)
MCH: 31.4 pg (ref 26.0–34.0)
MCHC: 33.8 g/dL (ref 32.0–36.0)
MCV: 93.1 fL (ref 80.0–100.0)
Platelets: 219 10*3/uL (ref 150–440)
RBC: 4.13 MIL/uL (ref 3.80–5.20)
RDW: 13.2 % (ref 11.5–14.5)
WBC: 8.5 10*3/uL (ref 3.6–11.0)

## 2016-07-13 LAB — COMPREHENSIVE METABOLIC PANEL
ALK PHOS: 74 U/L (ref 38–126)
ALT: 17 U/L (ref 14–54)
ANION GAP: 10 (ref 5–15)
AST: 21 U/L (ref 15–41)
Albumin: 3.6 g/dL (ref 3.5–5.0)
BUN: 33 mg/dL — ABNORMAL HIGH (ref 6–20)
CALCIUM: 8.6 mg/dL — AB (ref 8.9–10.3)
CHLORIDE: 105 mmol/L (ref 101–111)
CO2: 23 mmol/L (ref 22–32)
CREATININE: 1.77 mg/dL — AB (ref 0.44–1.00)
GFR, EST AFRICAN AMERICAN: 34 mL/min — AB (ref >60)
GFR, EST NON AFRICAN AMERICAN: 29 mL/min — AB (ref >60)
Glucose, Bld: 151 mg/dL — ABNORMAL HIGH (ref 65–99)
Potassium: 3.8 mmol/L (ref 3.5–5.1)
Sodium: 138 mmol/L (ref 135–145)
Total Bilirubin: 0.4 mg/dL (ref 0.3–1.2)
Total Protein: 7 g/dL (ref 6.5–8.1)

## 2016-07-13 LAB — CBC WITH DIFFERENTIAL/PLATELET
BASOS ABS: 0.1 10*3/uL (ref 0–0.1)
BASOS PCT: 1 %
EOS ABS: 0.7 10*3/uL (ref 0–0.7)
EOS PCT: 6 %
HCT: 39.1 % (ref 35.0–47.0)
HEMOGLOBIN: 13.1 g/dL (ref 12.0–16.0)
Lymphocytes Relative: 26 %
Lymphs Abs: 3 10*3/uL (ref 1.0–3.6)
MCH: 30.4 pg (ref 26.0–34.0)
MCHC: 33.6 g/dL (ref 32.0–36.0)
MCV: 90.6 fL (ref 80.0–100.0)
Monocytes Absolute: 1.5 10*3/uL — ABNORMAL HIGH (ref 0.2–0.9)
Monocytes Relative: 13 %
NEUTROS PCT: 54 %
Neutro Abs: 6.3 10*3/uL (ref 1.4–6.5)
PLATELETS: 266 10*3/uL (ref 150–440)
RBC: 4.31 MIL/uL (ref 3.80–5.20)
RDW: 13.2 % (ref 11.5–14.5)
WBC: 11.7 10*3/uL — AB (ref 3.6–11.0)

## 2016-07-13 LAB — GLUCOSE, CAPILLARY
GLUCOSE-CAPILLARY: 122 mg/dL — AB (ref 65–99)
GLUCOSE-CAPILLARY: 136 mg/dL — AB (ref 65–99)
Glucose-Capillary: 234 mg/dL — ABNORMAL HIGH (ref 65–99)
Glucose-Capillary: 292 mg/dL — ABNORMAL HIGH (ref 65–99)
Glucose-Capillary: 391 mg/dL — ABNORMAL HIGH (ref 65–99)
Glucose-Capillary: 380 mg/dL — ABNORMAL HIGH (ref 65–99)

## 2016-07-13 LAB — CREATININE, SERUM
CREATININE: 1.52 mg/dL — AB (ref 0.44–1.00)
GFR calc non Af Amer: 35 mL/min — ABNORMAL LOW (ref >60)
GFR, EST AFRICAN AMERICAN: 41 mL/min — AB (ref >60)

## 2016-07-13 LAB — LACTIC ACID, PLASMA: Lactic Acid, Venous: 1.4 mmol/L (ref 0.5–1.9)

## 2016-07-13 MED ORDER — GLIPIZIDE 10 MG PO TABS
10.0000 mg | ORAL_TABLET | Freq: Every day | ORAL | Status: DC
  Administered 2016-07-13 – 2016-07-18 (×6): 10 mg via ORAL
  Filled 2016-07-13 (×6): qty 1

## 2016-07-13 MED ORDER — DEXTROSE 5 % IV SOLN
1.0000 g | INTRAVENOUS | Status: DC
  Administered 2016-07-13 – 2016-07-18 (×6): 1 g via INTRAVENOUS
  Filled 2016-07-13 (×6): qty 10

## 2016-07-13 MED ORDER — ALBUTEROL SULFATE (2.5 MG/3ML) 0.083% IN NEBU
2.5000 mg | INHALATION_SOLUTION | RESPIRATORY_TRACT | Status: DC | PRN
Start: 2016-07-13 — End: 2016-07-13

## 2016-07-13 MED ORDER — METHYLPREDNISOLONE SODIUM SUCC 125 MG IJ SOLR
125.0000 mg | Freq: Four times a day (QID) | INTRAMUSCULAR | Status: DC
  Administered 2016-07-13: 125 mg via INTRAVENOUS
  Filled 2016-07-13: qty 2

## 2016-07-13 MED ORDER — ALBUTEROL SULFATE (2.5 MG/3ML) 0.083% IN NEBU
2.5000 mg | INHALATION_SOLUTION | Freq: Four times a day (QID) | RESPIRATORY_TRACT | Status: DC
  Administered 2016-07-13 – 2016-07-16 (×12): 2.5 mg via RESPIRATORY_TRACT
  Filled 2016-07-13 (×12): qty 3

## 2016-07-13 MED ORDER — SODIUM CHLORIDE 0.9 % IV BOLUS (SEPSIS)
1000.0000 mL | Freq: Once | INTRAVENOUS | Status: AC
  Administered 2016-07-13: 1000 mL via INTRAVENOUS

## 2016-07-13 MED ORDER — INSULIN ASPART 100 UNIT/ML ~~LOC~~ SOLN
0.0000 [IU] | Freq: Every day | SUBCUTANEOUS | Status: DC
  Administered 2016-07-14: 3 [IU] via SUBCUTANEOUS
  Administered 2016-07-15: 2 [IU] via SUBCUTANEOUS
  Filled 2016-07-13: qty 2
  Filled 2016-07-13: qty 3

## 2016-07-13 MED ORDER — INSULIN ASPART 100 UNIT/ML ~~LOC~~ SOLN
0.0000 [IU] | Freq: Three times a day (TID) | SUBCUTANEOUS | Status: DC
Start: 2016-07-13 — End: 2016-07-16
  Administered 2016-07-13: 8 [IU] via SUBCUTANEOUS
  Administered 2016-07-13 (×2): 15 [IU] via SUBCUTANEOUS
  Administered 2016-07-14: 5 [IU] via SUBCUTANEOUS
  Administered 2016-07-14: 8 [IU] via SUBCUTANEOUS
  Administered 2016-07-14: 15 [IU] via SUBCUTANEOUS
  Administered 2016-07-15: 11 [IU] via SUBCUTANEOUS
  Administered 2016-07-15: 3 [IU] via SUBCUTANEOUS
  Administered 2016-07-15: 8 [IU] via SUBCUTANEOUS
  Administered 2016-07-16: 3 [IU] via SUBCUTANEOUS
  Administered 2016-07-16: 2 [IU] via SUBCUTANEOUS
  Administered 2016-07-16: 11 [IU] via SUBCUTANEOUS
  Filled 2016-07-13: qty 15
  Filled 2016-07-13: qty 3
  Filled 2016-07-13: qty 2
  Filled 2016-07-13: qty 5
  Filled 2016-07-13: qty 3
  Filled 2016-07-13: qty 15
  Filled 2016-07-13: qty 8
  Filled 2016-07-13: qty 11
  Filled 2016-07-13: qty 8
  Filled 2016-07-13: qty 15
  Filled 2016-07-13: qty 1
  Filled 2016-07-13: qty 8

## 2016-07-13 MED ORDER — IPRATROPIUM-ALBUTEROL 0.5-2.5 (3) MG/3ML IN SOLN
3.0000 mL | Freq: Once | RESPIRATORY_TRACT | Status: AC
  Administered 2016-07-13: 3 mL via RESPIRATORY_TRACT

## 2016-07-13 MED ORDER — METFORMIN HCL 500 MG PO TABS: 1000.0000 mg | ORAL_TABLET | Freq: Two times a day (BID) | ORAL | Status: DC

## 2016-07-13 MED ORDER — CARVEDILOL 6.25 MG PO TABS
6.2500 mg | ORAL_TABLET | Freq: Two times a day (BID) | ORAL | Status: DC
  Administered 2016-07-13 – 2016-07-14 (×3): 6.25 mg via ORAL
  Filled 2016-07-13 (×3): qty 1

## 2016-07-13 MED ORDER — ENOXAPARIN SODIUM 40 MG/0.4ML ~~LOC~~ SOLN
40.0000 mg | SUBCUTANEOUS | Status: DC
  Administered 2016-07-13 – 2016-07-15 (×3): 40 mg via SUBCUTANEOUS
  Filled 2016-07-13 (×3): qty 0.4

## 2016-07-13 MED ORDER — BUDESONIDE 0.5 MG/2ML IN SUSP
0.5000 mg | Freq: Two times a day (BID) | RESPIRATORY_TRACT | Status: DC
  Administered 2016-07-13 – 2016-07-18 (×10): 0.5 mg via RESPIRATORY_TRACT
  Filled 2016-07-13 (×11): qty 2

## 2016-07-13 MED ORDER — ONDANSETRON HCL 4 MG/2ML IJ SOLN: 4.0000 mg | Freq: Four times a day (QID) | INTRAMUSCULAR | Status: DC | PRN

## 2016-07-13 MED ORDER — METHYLPREDNISOLONE SODIUM SUCC 125 MG IJ SOLR
125.0000 mg | Freq: Once | INTRAMUSCULAR | Status: AC
  Administered 2016-07-13: 125 mg via INTRAVENOUS
  Filled 2016-07-13: qty 2

## 2016-07-13 MED ORDER — IPRATROPIUM-ALBUTEROL 0.5-2.5 (3) MG/3ML IN SOLN
RESPIRATORY_TRACT | Status: AC
  Administered 2016-07-13: 3 mL via RESPIRATORY_TRACT
  Filled 2016-07-13: qty 3

## 2016-07-13 MED ORDER — METHYLPREDNISOLONE SODIUM SUCC 40 MG IJ SOLR
40.0000 mg | Freq: Every day | INTRAMUSCULAR | Status: DC
  Administered 2016-07-14 – 2016-07-16 (×3): 40 mg via INTRAVENOUS
  Filled 2016-07-13 (×3): qty 1

## 2016-07-13 MED ORDER — ACETAMINOPHEN 325 MG PO TABS
650.0000 mg | ORAL_TABLET | Freq: Four times a day (QID) | ORAL | Status: DC | PRN
  Administered 2016-07-13 – 2016-07-16 (×2): 650 mg via ORAL
  Filled 2016-07-13 (×2): qty 2

## 2016-07-13 MED ORDER — ACETAMINOPHEN 325 MG PO TABS
650.0000 mg | ORAL_TABLET | Freq: Once | ORAL | Status: AC
  Administered 2016-07-13: 650 mg via ORAL

## 2016-07-13 MED ORDER — ACETAMINOPHEN 325 MG PO TABS
ORAL_TABLET | ORAL | Status: AC
  Administered 2016-07-13: 650 mg via ORAL
  Filled 2016-07-13: qty 2

## 2016-07-13 MED ORDER — ACETAMINOPHEN 650 MG RE SUPP
650.0000 mg | Freq: Four times a day (QID) | RECTAL | Status: DC | PRN
Start: 2016-07-13 — End: 2016-07-18

## 2016-07-13 MED ORDER — SODIUM CHLORIDE 0.9 % IV SOLN
INTRAVENOUS | Status: DC
  Administered 2016-07-13 (×2): via INTRAVENOUS

## 2016-07-13 MED ORDER — IPRATROPIUM-ALBUTEROL 0.5-2.5 (3) MG/3ML IN SOLN
3.0000 mL | Freq: Once | RESPIRATORY_TRACT | Status: AC
  Administered 2016-07-13: 3 mL via RESPIRATORY_TRACT
  Filled 2016-07-13: qty 3

## 2016-07-13 MED ORDER — ONDANSETRON HCL 4 MG PO TABS: 4.0000 mg | ORAL_TABLET | Freq: Four times a day (QID) | ORAL | Status: DC | PRN

## 2016-07-13 MED ORDER — SODIUM CHLORIDE 0.9% FLUSH
3.0000 mL | Freq: Two times a day (BID) | INTRAVENOUS | Status: DC
  Administered 2016-07-13 – 2016-07-18 (×10): 3 mL via INTRAVENOUS

## 2016-07-13 MED ORDER — SENNOSIDES-DOCUSATE SODIUM 8.6-50 MG PO TABS: 1.0000 | ORAL_TABLET | Freq: Every evening | ORAL | Status: DC | PRN

## 2016-07-13 NOTE — Consult Note (Signed)
[facility] Cardiology Consultation Note  Patient ID: Wendy Summers, MRN: [id number], DOB/AGE: 05/12/1950 66 y.o. Admit date: 07/13/2016   Date of Consult: 07/13/2016 Primary Physician: [facility] [employer] Primary Cardiologist: None  Chief Complaint:  Chief Complaint  Patient presents with  . Cough   Reason for Consult: syncope  HPI: 66 y.o. female with known diabetes with complication of chronic kidney disease essential hypertension who is had an acute bronchitis with hypoxia cough and congestion. The patient was admitted for this and was placed on appropriate medications. During a coughing spell downstairs in the emergency room the patient had a syncopal episode for moment. There was no evidence of chest discomfort or congestive heart failure at the time. Currently the patient does have an EKG showing normal sinus rhythm and a normal troponin. She does have the chronic kidney disease stage III which may contribute to above. Currently the patient has had some improvements of symptoms  Past Medical History:  Diagnosis Date  . Asthma   . Bronchitis   . Diabetes mellitus without complication [facility])       Surgical History:  Past Surgical History:  Procedure Laterality Date  . CHOLECYSTECTOMY       Home Meds: Prior to Admission medications   Medication Sig Start Date End Date Taking? Authorizing Provider  carvedilol (COREG) 6.25 MG tablet Take 6.25 mg by mouth 2 (two) times daily with a meal.   Yes [provider]  glipiZIDE (GLUCOTROL) 10 MG tablet Take 10 mg by mouth daily before breakfast.   Yes [provider]  lisinopril-hydrochlorothiazide (PRINZIDE,ZESTORETIC) 20-25 MG tablet Take 1 tablet by mouth daily.   Yes [provider]  metFORMIN (GLUCOPHAGE) 1000 MG tablet Take 1,000 mg by mouth 2 (two) times daily with a meal.   Yes [provider]    Inpatient Medications:  . albuterol  2.5 mg Nebulization Q6H  . budesonide  (PULMICORT) nebulizer solution  0.5 mg Nebulization BID  . carvedilol  6.25 mg Oral BID WC  . enoxaparin (LOVENOX) injection  40 mg Subcutaneous Q24H  . glipiZIDE  10 mg Oral QAC breakfast  . insulin aspart  0-15 Units Subcutaneous TID WC  . insulin aspart  0-5 Units Subcutaneous QHS  . [START ON 07/14/2016] methylPREDNISolone (SOLU-MEDROL) injection  40 mg Intravenous Daily  . sodium chloride flush  3 mL Intravenous Q12H   . sodium chloride 75 mL/hr at 07/13/16 0657  . cefTRIAXone (ROCEPHIN)  IV Stopped (07/13/16 0950)    Allergies: No Known Allergies  Social History   Social History  . Marital status: Married    Spouse name: N/A  . Number of children: N/A  . Years of education: N/A   Occupational History  . home maker    Social History Main Topics  . Smoking status: Never Smoker  . Smokeless tobacco: Never Used  . Alcohol use No  . Drug use: No  . Sexual activity: Not Currently   Other Topics Concern  . Not on file   Social History Narrative  . No narrative on file     History reviewed. No pertinent family history.   Review of Systems Positive for Cough congestion syncope Negative for: General:  chills, fever, night sweats or weight changes.  Cardiovascular: PND orthopnea positive for syncope dizziness  Dermatological skin lesions rashes Respiratory: Positive for Cough congestion Urologic: Frequent urination urination at night and hematuria Abdominal: negative for nausea, vomiting, diarrhea, bright red blood per rectum, melena, or hematemesis Neurologic:  negative for visual changes, and/or hearing changes  All other systems reviewed and are otherwise negative except as noted above.  Labs:  Recent Labs  07/13/16 0115 07/13/16 0736 07/13/16 1314  TROPONINI <0.03 <0.03 <0.03   Lab Results  Component Value Date   WBC 8.5 07/13/2016   HGB 13.0 07/13/2016   HCT 38.5 07/13/2016   MCV 93.1 07/13/2016   PLT 219 07/13/2016    Recent Labs Lab  07/13/16 0115 07/13/16 0736  NA 138  --   K 3.8  --   CL 105  --   CO2 23  --   BUN 33*  --   CREATININE 1.77* 1.52*  CALCIUM 8.6*  --   PROT 7.0  --   BILITOT 0.4  --   ALKPHOS 74  --   ALT 17  --   AST 21  --   GLUCOSE 151*  --    No results found for: CHOL, HDL, LDLCALC, TRIG No results found for: DDIMER  Radiology/Studies:  Dg Chest Port 1 View  Result Date: 07/13/2016 CLINICAL DATA:  Cough x3 days with fever EXAM: PORTABLE CHEST 1 VIEW COMPARISON:  03/10/2015 CXR FINDINGS: Stable cardiomegaly. No aortic aneurysm. Mild stable interstitial prominence may reflect chronic bronchitic change. No alveolar consolidation or CHF. No effusion. No acute nor suspicious osseous abnormalities. IMPRESSION: Stable cardiomegaly.  Chronic bronchitic change of the lungs. Electronically Signed   By: Tollie Ethavid  Kwon M.D.   On: 07/13/2016 02:37    EKG: Normal sinus rhythm  Weights: Filed Weights   07/12/16 2105 07/13/16 0646  Weight: 104.3 kg (230 lb) 106.4 kg (234 lb 9.6 oz)     Physical Exam: Blood pressure 131/65, pulse 93, temperature 98.5 F (36.9 C), temperature source Oral, resp. rate 18, height 5\' 3"  (1.6 m), weight 106.4 kg (234 lb 9.6 oz), SpO2 94 %. Body mass index is 41.56 kg/m. General: Well developed, well nourished, in no acute distress. Head eyes ears nose throat: Normocephalic, atraumatic, sclera non-icteric, no xanthomas, nares are without discharge. No apparent thyromegaly and/or mass  Lungs: Normal respiratory effort. Diffuse wheezes, no rales,Diffuse rhonchi.  Heart: RRR with normal S1 S2. no murmur gallop, no rub, PMI is normal size and placement, carotid upstroke normal without bruit, jugular venous pressure is normal Abdomen: Soft, non-tender, non-distended with normoactive bowel sounds. No hepatomegaly. No rebound/guarding. No obvious abdominal masses. Abdominal aorta is normal size without bruit Extremities: Trace edema. no cyanosis, no clubbing, no ulcers  Peripheral  : 2+ bilateral upper extremity pulses, 2+ bilateral femoral pulses, 2+ bilateral dorsal pedal pulse Neuro: Alert and oriented. No facial asymmetry. No focal deficit. Moves all extremities spontaneously. Musculoskeletal: Normal muscle tone without kyphosis Psych:  Responds to questions appropriately with a normal affect.    Assessment: 66 year old female with diabetes with complications chronic kidney disease stage III essential hypertension mixed hyperlipidemia on appropriate medication management with acute bronchitis causing hypoxia and cough congestion with cough-induced syncope and no current evidence of myocardial infarction or heart failure  Plan: 1. Continue medication management and supportive care of bronchitis 2. Continue hypertension control with carvedilol 3. No further intervention or diagnostic testing necessary for cough-induced syncope now resolved 4. Ambulate and follow for any further significant symptoms EKG changes or rhythm disturbances causing syncope  Signed, Lamar BlinksBruce J Kert Shackett M.D. [facility] 07/13/2016, 5:32 PM

## 2016-07-13 NOTE — ED Notes (Signed)
Solumedrol dose on hold currently d/t patient receiving last dose (one time dose in ER) at [id number]. Message sent to pharmacy to change schedule.

## 2016-07-13 NOTE — Plan of Care (Signed)
Problem: Pain Managment: Goal: General experience of comfort will improve Outcome: Not Progressing PIV removed by patient this AM. Other IV eventually infiltrated. IV team has now inserted what has become a third IV just today. Wendy FavreSteven M [facility] Association[facility]hoff

## 2016-07-13 NOTE — ED Notes (Signed)
Called to waiting area , pt now complains of dizziness, EKG performed , vital signs checked

## 2016-07-13 NOTE — ED Notes (Signed)
Patient states she continues to be sweaty. Patient reports moderate amount of relief with breathing/wheezing after Duoneb. Declined Albuterol neb at this time. Advised to notify this RN if needed, will reassess at later time as well. Patient in no acute distress other than mild diaphoresis. Awaiting bed to be ready on 2A at this time. No other needs currently.

## 2016-07-13 NOTE — ED Notes (Signed)
Still unable to collect adequate urine sample d/t being contaminated with diarrhea (has now had 2 episodes).

## 2016-07-13 NOTE — ED Notes (Addendum)
Patient was in waiting room, was alert and oriented. Stated to husband she didn't feel well. Was diaphoretic, disoriented, and incontinent at that time. Systolic pressure was in the 80's at time of incident. CBG 136.  Patient remains diaphoretic and disoriented, AMS.

## 2016-07-13 NOTE — ED Notes (Signed)
Pt transferred to stretcher and taken back to room

## 2016-07-13 NOTE — ED Notes (Signed)
Patient up to restroom with minimal assistance. Now c/o HA. Awaiting MD for orders.

## 2016-07-13 NOTE — ED Provider Notes (Signed)
[facility] Emergency Department Provider Note   ____________________________________________   First MD Initiated Contact with Patient 07/13/16 [phone]     (approximate)  I have reviewed the triage vital signs and the nursing notes.   HISTORY  Chief Complaint Cough    HPI Wendy Summers is a 66 y.o. female who presents to the ED from home with a chief complaint of nonproductive cough3 days. While in the flex waiting room, patient stated to his husband that she did not feel well and was found to be diaphoretic, disoriented, incontinent of urine and stool, with a near syncopal episode. She was rushed back to the treatment room and her systolic blood pressure was found to be in the 80s. Blood sugar normal. Denies associated fever, chills, chest pain, shortness of breath, abdominal pain, nausea, vomiting, diarrhea. Denies recent travel or trauma.   Past Medical History:  Diagnosis Date  . Asthma   . Diabetes mellitus without complication (HCC)     There are no active problems to display for this patient.   History reviewed. No pertinent surgical history.  Prior to Admission medications   Medication Sig Start Date End Date Taking? Authorizing Provider  azithromycin (ZITHROMAX Z-PAK) 250 MG tablet Take 2 tablets (500 mg) on  Day 1,  followed by 1 tablet (250 mg) once daily on Days 2 through 5. 03/10/15   Sharman Cheek, MD  cephALEXin (KEFLEX) 500 MG capsule Take 1 capsule (500 mg total) by mouth 3 (three) times daily. 07/27/15   Minna Antis, MD  promethazine (PHENERGAN) 25 MG tablet Take 1 tablet (25 mg total) by mouth every 6 (six) hours as needed for nausea or vomiting. 03/10/15   Sharman Cheek, MD  ranitidine (ZANTAC) 150 MG capsule Take 1 capsule (150 mg total) by mouth 2 (two) times daily. 03/10/15   Sharman Cheek, MD    Allergies Patient has no known allergies.  No family history on file.  Social History Social History  Substance  Use Topics  . Smoking status: Never Smoker  . Smokeless tobacco: Not on file  . Alcohol use No    Review of Systems  Constitutional: No fever/chills. Eyes: No visual changes. ENT: No sore throat. Cardiovascular: Denies chest pain. Respiratory: Positive for nonproductive cough. Denies shortness of breath. Gastrointestinal: No abdominal pain.  No nausea, no vomiting.  No diarrhea.  No constipation. Genitourinary: Negative for dysuria. Musculoskeletal: Negative for back pain. Skin: Positive for diaphoresis. Negative for rash. Neurological: Positive for near-syncope. Negative for headaches, focal weakness or numbness.   ____________________________________________   PHYSICAL EXAM:  VITAL SIGNS: ED Triage Vitals  Enc Vitals Group     BP 07/12/16 2104 117/60     Pulse Rate 07/12/16 2104 89     Resp 07/12/16 2104 20     Temp 07/12/16 2104 99 F (37.2 C)     Temp Source 07/12/16 2104 Oral     SpO2 07/12/16 2104 97 %     Weight 07/12/16 2105 230 lb (104.3 kg)     Height 07/12/16 2105 5\' 3"  (1.6 m)     Head Circumference --      Peak Flow --      Pain Score --      Pain Loc --      Pain Edu? --      Excl. in GC? --     Constitutional: Alert and oriented. Ill appearing and in mild acute distress. Eyes: Conjunctivae are normal. PERRL. EOMI. Head: Atraumatic.  Nose: No congestion/rhinnorhea. Mouth/Throat: Mucous membranes are moist.  Oropharynx non-erythematous. Neck: No stridor.  No carotid bruits. Cardiovascular: Normal rate, regular rhythm. Grossly normal heart sounds.  Good peripheral circulation. Respiratory: Normal respiratory effort.  No retractions. Lungs with audible wheezing. Gastrointestinal: Soft and nontender to light or deep palpation. No distention. No abdominal bruits. No CVA tenderness. Musculoskeletal: No lower extremity tenderness nor edema.  No joint effusions. Neurologic:  Normal speech and language. No gross focal neurologic deficits are appreciated.    Skin:  Skin is clammy, diaphoretic and intact. No rash noted. Psychiatric: Mood and affect are normal. Speech and behavior are normal.  ____________________________________________   LABS (all labs ordered are listed, but only abnormal results are displayed)  Labs Reviewed  CBC WITH DIFFERENTIAL/PLATELET - Abnormal; Notable for the following:       Result Value   WBC 11.7 (*)    Monocytes Absolute 1.5 (*)    All other components within normal limits  COMPREHENSIVE METABOLIC PANEL - Abnormal; Notable for the following:    Glucose, Bld 151 (*)    BUN 33 (*)    Creatinine, Ser 1.77 (*)    Calcium 8.6 (*)    GFR calc non Af Amer 29 (*)    GFR calc Af Amer 34 (*)    All other components within normal limits  GLUCOSE, CAPILLARY - Abnormal; Notable for the following:    Glucose-Capillary 136 (*)    All other components within normal limits  GLUCOSE, CAPILLARY - Abnormal; Notable for the following:    Glucose-Capillary 122 (*)    All other components within normal limits  CULTURE, BLOOD (ROUTINE X 2)  CULTURE, BLOOD (ROUTINE X 2)  URINE CULTURE  LACTIC ACID, PLASMA  TROPONIN I  URINALYSIS, COMPLETE (UACMP) WITH MICROSCOPIC  CBG MONITORING, ED   ____________________________________________  EKG  ED ECG REPORT I, Cardarius Senat J, the attending physician, personally viewed and interpreted this ECG.   Date: 07/13/2016  EKG Time: 0028  Rate: 77  Rhythm: normal EKG, normal sinus rhythm  Axis: LAD  Intervals:left anterior fascicular block  ST&T Change: Nonspecific  ____________________________________________  RADIOLOGY  Portable chest x-ray (viewed by me, interpreted per Dr. Sterling Big): Stable cardiomegaly. Chronic bronchitic change of the lungs. ____________________________________________   PROCEDURES  Procedure(s) performed: None  Procedures  Critical Care performed: Yes, see critical care note(s)   CRITICAL CARE Performed by: Irean Hong   Total critical care  time: 30 minutes  Critical care time was exclusive of separately billable procedures and treating other patients.  Critical care was necessary to treat or prevent imminent or life-threatening deterioration.  Critical care was time spent personally by me on the following activities: development of treatment plan with patient and/or surrogate as well as nursing, discussions with consultants, evaluation of patient's response to treatment, examination of patient, obtaining history from patient or surrogate, ordering and performing treatments and interventions, ordering and review of laboratory studies, ordering and review of radiographic studies, pulse oximetry and re-evaluation of patient's condition.  ____________________________________________   INITIAL IMPRESSION / ASSESSMENT AND PLAN / ED COURSE  Pertinent labs & imaging results that were available during my care of the patient were reviewed by me and considered in my medical decision making (see chart for details).  66 year old female with asthma and diabetes who presents with a 3 day history of nonproductive cough. Patient was rushed back to treatment room secondary to near-syncopal episode in the lobby. She is diaphoretic with audible wheezing. Will obtain screening lab work,  initiate DuoNeb, started IV fluid resuscitation and reassess.  Clinical Course as of Jul 14 243  Wed Jul 13, 2016  0242 Audible wheezing. Patient states she is overall not feeling well. Will administer IV Solu-Medrol, second DuoNeb and discuss with hospitalist to evaluate patient in the emergency department for admission.  [JS]    Clinical Course User Index [JS] Irean HongSung, Tacoya Altizer J, MD     ____________________________________________   FINAL CLINICAL IMPRESSION(S) / ED DIAGNOSES  Final diagnoses:  Mild intermittent asthma with exacerbation  Near syncope  Bronchitis      NEW MEDICATIONS STARTED DURING THIS VISIT:  New Prescriptions   No medications on  file     Note:  This document was prepared using Dragon voice recognition software and may include unintentional dictation errors.    Irean HongSung, Helayna Dun J, MD 07/13/16 [phone]

## 2016-07-13 NOTE — ED Notes (Signed)
Patient called out stating she felt very "sweaty again". Patient mildly diaphoretic. Glucose, EKG, vitals all checked. Dr. Dolores FrameSung notified. Patient also wheezing moderate amount again. Up to restroom again with minimal one assist without difficulty.

## 2016-07-13 NOTE — H&P (Addendum)
[facility] [place] [facility]   PATIENT NAME: Wendy Summers    MR#:  [id number]  DATE OF BIRTH:  June 14, 1950  DATE OF ADMISSION:  07/13/2016  PRIMARY CARE PHYSICIAN: [facility]   REQUESTING/REFERRING PHYSICIAN:   CHIEF COMPLAINT:   Chief Complaint  Patient presents with  . Cough    HISTORY OF PRESENT ILLNESS: Wendy Summers  is a 66 y.o. female with a known history of Diabetes mellitus, bronchial asthma presented to the emergency room with cough, wheezing. Patient also felt dizzy and diaphoretic and about to pass out. Patient was given nebulization treatment in the emergency room for wheezing and was also given IV Solu-Medrol. No complaints of any chest pain. No fever and chills. Cough is dry in nature. Hospitalist service was consulted for further care of the patient. First set of troponin was negative. EKG normal sinus rhythm with no ST segment changes.  PAST MEDICAL HISTORY:   Past Medical History:  Diagnosis Date  . Asthma   . Diabetes mellitus without complication (HCC)     PAST SURGICAL HISTORY: Past Surgical History:  Procedure Laterality Date  . CHOLECYSTECTOMY      SOCIAL HISTORY:  Social History  Substance Use Topics  . Smoking status: Never Smoker  . Smokeless tobacco: Never Used  . Alcohol use No    FAMILY HISTORY:  Family History  Problem Relation Age of Onset  . Hypertension Neg Hx   . Diabetes Neg Hx     DRUG ALLERGIES: No Known Allergies  REVIEW OF SYSTEMS:   CONSTITUTIONAL: No fever, fatigue or weakness.  EYES: No blurred or double vision.  EARS, NOSE, AND THROAT: No tinnitus or ear pain.  RESPIRATORY: Has cough, shortness of breath, wheezing  No hemoptysis.  CARDIOVASCULAR: No chest pain, orthopnea, edema.  GASTROINTESTINAL: No nausea, vomiting, diarrhea or abdominal pain.  GENITOURINARY: Has dysuria, no hematuria.  ENDOCRINE: No polyuria, nocturia,  HEMATOLOGY: No anemia, easy bruising or  bleeding SKIN: No rash or lesion. MUSCULOSKELETAL: No joint pain or arthritis.   NEUROLOGIC: No tingling, numbness, weakness.  Had dizziness PSYCHIATRY: No anxiety or depression.   MEDICATIONS AT HOME:  Prior to Admission medications   Medication Sig Start Date End Date Taking? Authorizing Provider  carvedilol (COREG) 6.25 MG tablet Take 6.25 mg by mouth 2 (two) times daily with a meal.   Yes [provider]  glipiZIDE (GLUCOTROL) 10 MG tablet Take 10 mg by mouth daily before breakfast.   Yes [provider]  lisinopril-hydrochlorothiazide (PRINZIDE,ZESTORETIC) 20-25 MG tablet Take 1 tablet by mouth daily.   Yes [provider]  metFORMIN (GLUCOPHAGE) 1000 MG tablet Take 1,000 mg by mouth 2 (two) times daily with a meal.   Yes [provider]      PHYSICAL EXAMINATION:   VITAL SIGNS: Blood pressure (!) 108/91, pulse 93, temperature 98.3 F (36.8 C), temperature source Oral, resp. rate 20, height 5\' 3"  (1.6 m), weight 104.3 kg (230 lb), SpO2 97 %.  GENERAL:  66 y.o.-year-old patient lying in the bed with no acute distress.  EYES: Pupils equal, round, reactive to light and accommodation. No scleral icterus. Extraocular muscles intact.  HEENT: Head atraumatic, normocephalic. Oropharynx and nasopharynx clear.  NECK:  Supple, no jugular venous distention. No thyroid enlargement, no tenderness.  LUNGS: Decreased breath sounds bilaterally, bilateral wheezing noted, rales. No use of accessory muscles of respiration.  CARDIOVASCULAR: S1, S2 normal. No murmurs, rubs, or gallops.  ABDOMEN: Soft, nontender, nondistended. Bowel  sounds present. No organomegaly or mass.  EXTREMITIES: No pedal edema, cyanosis, or clubbing.  NEUROLOGIC: Cranial nerves II through XII are intact. Muscle strength 5/5 in all extremities. Sensation intact. Gait not checked.  PSYCHIATRIC: The patient is alert and oriented x 3.  SKIN: No obvious rash, lesion, or ulcer.   LABORATORY  PANEL:   CBC  Recent Labs Lab 07/13/16 0029  WBC 11.7*  HGB 13.1  HCT 39.1  PLT 266  MCV 90.6  MCH 30.4  MCHC 33.6  RDW 13.2  LYMPHSABS 3.0  MONOABS 1.5*  EOSABS 0.7  BASOSABS 0.1   ------------------------------------------------------------------------------------------------------------------  Chemistries   Recent Labs Lab 07/13/16 0115  NA 138  K 3.8  CL 105  CO2 23  GLUCOSE 151*  BUN 33*  CREATININE 1.77*  CALCIUM 8.6*  AST 21  ALT 17  ALKPHOS 74  BILITOT 0.4   ------------------------------------------------------------------------------------------------------------------ estimated creatinine clearance is 37.1 mL/min (A) (by C-G formula based on SCr of 1.77 mg/dL (H)). ------------------------------------------------------------------------------------------------------------------ No results for input(s): TSH, T4TOTAL, T3FREE, THYROIDAB in the last 72 hours.  Invalid input(s): FREET3   Coagulation profile No results for input(s): INR, PROTIME in the last 168 hours. ------------------------------------------------------------------------------------------------------------------- No results for input(s): DDIMER in the last 72 hours. -------------------------------------------------------------------------------------------------------------------  Cardiac Enzymes  Recent Labs Lab 07/13/16 0115  TROPONINI <0.03   ------------------------------------------------------------------------------------------------------------------ Invalid input(s): POCBNP  ---------------------------------------------------------------------------------------------------------------  Urinalysis    Component Value Date/Time   COLORURINE YELLOW (A) 07/13/2016 0315   APPEARANCEUR CLOUDY (A) 07/13/2016 0315   APPEARANCEUR Cloudy 10/02/2013 0058   LABSPEC 1.017 07/13/2016 0315   LABSPEC 1.029 10/02/2013 0058   PHURINE 5.0 07/13/2016 0315   GLUCOSEU NEGATIVE  07/13/2016 0315   GLUCOSEU Negative 10/02/2013 0058   HGBUR NEGATIVE 07/13/2016 0315   BILIRUBINUR NEGATIVE 07/13/2016 0315   BILIRUBINUR Negative 10/02/2013 0058   KETONESUR NEGATIVE 07/13/2016 0315   PROTEINUR 30 (A) 07/13/2016 0315   NITRITE NEGATIVE 07/13/2016 0315   LEUKOCYTESUR LARGE (A) 07/13/2016 0315   LEUKOCYTESUR 2+ 10/02/2013 0058     RADIOLOGY: Dg Chest Port 1 View  Result Date: 07/13/2016 CLINICAL DATA:  Cough x3 days with fever EXAM: PORTABLE CHEST 1 VIEW COMPARISON:  03/10/2015 CXR FINDINGS: Stable cardiomegaly. No aortic aneurysm. Mild stable interstitial prominence may reflect chronic bronchitic change. No alveolar consolidation or CHF. No effusion. No acute nor suspicious osseous abnormalities. IMPRESSION: Stable cardiomegaly.  Chronic bronchitic change of the lungs. Electronically Signed   By: Tollie Eth M.D.   On: 07/13/2016 02:37    EKG: Orders placed or performed during the hospital encounter of 07/13/16  . ED EKG within 10 minutes  . ED EKG within 10 minutes  . ED EKG  . ED EKG    IMPRESSION AND PLAN: 66 year old female patient with history of bronchial last month and diabetes mellitus presented to the emergency room with wheezing, cough and dizziness. Admitting diagnosis 1. Acute bronchial asthma exacerbation 2. Near syncope 3. Dehydration 4. Urinary tract infection Treatment plan Admit patient to medical floor IV fluid hydration IV Solu-Medrol 120 MG every 6 hourly Cycle troponin and check echocardiogram Breathing treatments Follow-up renal function  All the records are reviewed and case discussed with ED provider. Management plans discussed with the patient, family and they are in agreement.  CODE STATUS:FULL CODE Code Status History    This patient does not have a recorded code status. Please follow your organizational policy for patients in this situation.       TOTAL TIME TAKING CARE OF THIS PATIENT:  50 minutes.    Ihor Austin  M.D on 07/13/2016 at 5:47 AM  Between 7am to 6pm - Pager - [phone]  After 6pm go to [email or web address] - password [email or web address] [facility]  [facility]  Office  [phone]  CC: Primary care physician; [employer] [employer]

## 2016-07-13 NOTE — Progress Notes (Signed)
Patient ID: Wendy Summers, female   DOB: 07-19-1950, 66 y.o.   MRN: [id number]  [facility] PROGRESS NOTE  Wendy Summers WUJ:[id number] DOB: Jul 15, 1950 DOA: 07/13/2016 PCP: [facility] [employer]  HPI/Subjective: Patient feeling nauseous, fever sweating. Dry cough with sore throat. Diarrhea 4-5 times. Came in with a near syncopal episode.  Objective: Vitals:   07/13/16 0646 07/13/16 1219  BP: (!) 142/54 131/65  Pulse: 90 93  Resp: 18   Temp: 98.3 F (36.8 C) 98.5 F (36.9 C)    Filed Weights   07/12/16 2105 07/13/16 0646  Weight: 104.3 kg (230 lb) 106.4 kg (234 lb 9.6 oz)    ROS: Review of Systems  Constitutional: Positive for fever. Negative for chills.  Eyes: Negative for blurred vision.  Respiratory: Positive for cough, shortness of breath and wheezing.   Cardiovascular: Negative for chest pain.  Gastrointestinal: Positive for nausea. Negative for abdominal pain, constipation, diarrhea and vomiting.  Genitourinary: Negative for dysuria.  Musculoskeletal: Negative for joint pain.  Neurological: Negative for dizziness and headaches.   Exam: Physical Exam  Constitutional: She is oriented to person, place, and time.  HENT:  Nose: No mucosal edema.  Mouth/Throat: No oropharyngeal exudate or posterior oropharyngeal edema.  Eyes: Conjunctivae, EOM and lids are normal. Pupils are equal, round, and reactive to light.  Neck: No JVD present. Carotid bruit is not present. No edema present. No thyroid mass and no thyromegaly present.  Cardiovascular: S1 normal and S2 normal.  Exam reveals no gallop.   No murmur heard. Pulses:      Dorsalis pedis pulses are 2+ on the right side, and 2+ on the left side.  Respiratory: No respiratory distress. She has decreased breath sounds in the right middle field, the right lower field, the left middle field and the left lower field. She has wheezes in the right upper field, the right middle field, the right lower field, the left  upper field, the left middle field and the left lower field. She has no rhonchi. She has no rales.  GI: Soft. Bowel sounds are normal. There is no tenderness.  Musculoskeletal:       Right ankle: She exhibits swelling.       Left ankle: She exhibits swelling.  Lymphadenopathy:    She has no cervical adenopathy.  Neurological: She is alert and oriented to person, place, and time. No cranial nerve deficit.  Skin: Skin is warm. No rash noted. Nails show no clubbing.  Psychiatric: She has a normal mood and affect.      Data Reviewed: Basic Metabolic Panel:  Recent Labs Lab 07/13/16 0115 07/13/16 0736  NA 138  --   K 3.8  --   CL 105  --   CO2 23  --   GLUCOSE 151*  --   BUN 33*  --   CREATININE 1.77* 1.52*  CALCIUM 8.6*  --    Liver Function Tests:  Recent Labs Lab 07/13/16 0115  AST 21  ALT 17  ALKPHOS 74  BILITOT 0.4  PROT 7.0  ALBUMIN 3.6   CBC:  Recent Labs Lab 07/13/16 0029 07/13/16 0736  WBC 11.7* 8.5  NEUTROABS 6.3  --   HGB 13.1 13.0  HCT 39.1 38.5  MCV 90.6 93.1  PLT 266 219   Cardiac Enzymes:  Recent Labs Lab 07/13/16 0115 07/13/16 0736 07/13/16 1314  TROPONINI <0.03 <0.03 <0.03    CBG:  Recent Labs Lab 07/13/16 0023 07/13/16 0120 07/13/16 0459 07/13/16 0737 07/13/16  1133  GLUCAP 136* 122* 234* 292* 391*    Recent Results (from the past 240 hour(s))  Culture, blood (routine x 2)     Status: None (Preliminary result)   Collection Time: 07/13/16  1:10 AM  Result Value Ref Range Status   Specimen Description BLOOD Blood Culture adequate volume  Final   Special Requests   Final    BOTTLES DRAWN AEROBIC AND ANAEROBIC BLOOD RIGHT HAND   Culture NO GROWTH < 12 HOURS  Final   Report Status PENDING  Incomplete  Culture, blood (routine x 2)     Status: None (Preliminary result)   Collection Time: 07/13/16  1:17 AM  Result Value Ref Range Status   Specimen Description BLOOD Blood Culture adequate volume  Final   Special Requests    Final    BOTTLES DRAWN AEROBIC AND ANAEROBIC BLOOD LEFT HAND   Culture NO GROWTH < 12 HOURS  Final   Report Status PENDING  Incomplete     Studies: Dg Chest Port 1 View  Result Date: 07/13/2016 CLINICAL DATA:  Cough x3 days with fever EXAM: PORTABLE CHEST 1 VIEW COMPARISON:  03/10/2015 CXR FINDINGS: Stable cardiomegaly. No aortic aneurysm. Mild stable interstitial prominence may reflect chronic bronchitic change. No alveolar consolidation or CHF. No effusion. No acute nor suspicious osseous abnormalities. IMPRESSION: Stable cardiomegaly.  Chronic bronchitic change of the lungs. Electronically Signed   By: Tollie Ethavid  Kwon M.D.   On: 07/13/2016 02:37    Scheduled Meds: . albuterol  2.5 mg Nebulization Q6H  . budesonide (PULMICORT) nebulizer solution  0.5 mg Nebulization BID  . carvedilol  6.25 mg Oral BID WC  . enoxaparin (LOVENOX) injection  40 mg Subcutaneous Q24H  . glipiZIDE  10 mg Oral QAC breakfast  . insulin aspart  0-15 Units Subcutaneous TID WC  . insulin aspart  0-5 Units Subcutaneous QHS  . [START ON 07/14/2016] methylPREDNISolone (SOLU-MEDROL) injection  40 mg Intravenous Daily  . sodium chloride flush  3 mL Intravenous Q12H   Continuous Infusions: . sodium chloride 75 mL/hr at 07/13/16 0657  . cefTRIAXone (ROCEPHIN)  IV Stopped (07/13/16 0950)    Assessment/Plan:  1. Asthmatic bronchitis. Poor air entry and wheezing throughout entire lung field. Change Solu-Medrol to 40 mg IV daily. Added budesonide nebulizers. Continue DuoNeb nebulizer solution. Empiric Rocephin. 2. Acute cystitis with hematuria. Urine culture pending empiric Rocephin. 3. Type 2 diabetes mellitus. Sugars will be high on steroids. On sliding scale insulin and glipizide. Add on a hemoglobin A1c. 4. Acute kidney injury. IV fluid hydration 5. Near syncope. Likely with bronchitis and coughing  Code Status:     Code Status Orders        Start     Ordered   07/13/16 0652  Full code  Continuous      07/13/16 0651    Code Status History    Date Active Date Inactive Code Status Order ID Comments User Context   This patient has a current code status but no historical code status.      Disposition Plan: Evaluate respiratory status on a daily basis  Antibiotics:  Rocephin  Time spent: 36 minutes  Alford HighlandWIETING, Khylah Kendra  [employer]

## 2016-07-14 ENCOUNTER — Observation Stay
Admit: 2016-07-14 | Discharge: 2016-07-14 | Disposition: A | Discharge status: Home / Self Care | Payer: Medicaid Other | Attending: Internal Medicine | Admitting: Internal Medicine

## 2016-07-14 DIAGNOSIS — J4521 Mild intermittent asthma with (acute) exacerbation: Secondary | ICD-10-CM | POA: Diagnosis present

## 2016-07-14 DIAGNOSIS — B9620 Unspecified Escherichia coli [E. coli] as the cause of diseases classified elsewhere: Secondary | ICD-10-CM | POA: Diagnosis present

## 2016-07-14 DIAGNOSIS — R05 Cough: Secondary | ICD-10-CM | POA: Diagnosis present

## 2016-07-14 DIAGNOSIS — N3001 Acute cystitis with hematuria: Secondary | ICD-10-CM | POA: Diagnosis present

## 2016-07-14 DIAGNOSIS — E1165 Type 2 diabetes mellitus with hyperglycemia: Secondary | ICD-10-CM | POA: Diagnosis not present

## 2016-07-14 DIAGNOSIS — E860 Dehydration: Secondary | ICD-10-CM | POA: Diagnosis present

## 2016-07-14 DIAGNOSIS — Z79899 Other long term (current) drug therapy: Secondary | ICD-10-CM | POA: Diagnosis not present

## 2016-07-14 DIAGNOSIS — E1122 Type 2 diabetes mellitus with diabetic chronic kidney disease: Secondary | ICD-10-CM | POA: Diagnosis present

## 2016-07-14 DIAGNOSIS — J45909 Unspecified asthma, uncomplicated: Secondary | ICD-10-CM | POA: Diagnosis present

## 2016-07-14 DIAGNOSIS — Z7984 Long term (current) use of oral hypoglycemic drugs: Secondary | ICD-10-CM | POA: Diagnosis not present

## 2016-07-14 DIAGNOSIS — I129 Hypertensive chronic kidney disease with stage 1 through stage 4 chronic kidney disease, or unspecified chronic kidney disease: Secondary | ICD-10-CM | POA: Diagnosis present

## 2016-07-14 DIAGNOSIS — R0902 Hypoxemia: Secondary | ICD-10-CM | POA: Diagnosis present

## 2016-07-14 DIAGNOSIS — N183 Chronic kidney disease, stage 3 (moderate): Secondary | ICD-10-CM | POA: Diagnosis present

## 2016-07-14 DIAGNOSIS — N179 Acute kidney failure, unspecified: Secondary | ICD-10-CM | POA: Diagnosis present

## 2016-07-14 DIAGNOSIS — T380X5A Adverse effect of glucocorticoids and synthetic analogues, initial encounter: Secondary | ICD-10-CM | POA: Diagnosis not present

## 2016-07-14 LAB — CBC
HEMATOCRIT: 37.9 % (ref 35.0–47.0)
HEMOGLOBIN: 12.6 g/dL (ref 12.0–16.0)
MCH: 30.5 pg (ref 26.0–34.0)
MCHC: 33.2 g/dL (ref 32.0–36.0)
MCV: 92 fL (ref 80.0–100.0)
Platelets: 260 10*3/uL (ref 150–440)
RBC: 4.12 MIL/uL (ref 3.80–5.20)
RDW: 12.9 % (ref 11.5–14.5)
WBC: 11.4 10*3/uL — ABNORMAL HIGH (ref 3.6–11.0)

## 2016-07-14 LAB — BASIC METABOLIC PANEL
ANION GAP: 10 (ref 5–15)
BUN: 34 mg/dL — ABNORMAL HIGH (ref 6–20)
CHLORIDE: 104 mmol/L (ref 101–111)
CO2: 23 mmol/L (ref 22–32)
Calcium: 8.8 mg/dL — ABNORMAL LOW (ref 8.9–10.3)
Creatinine, Ser: 1.29 mg/dL — ABNORMAL HIGH (ref 0.44–1.00)
GFR calc non Af Amer: 43 mL/min — ABNORMAL LOW (ref >60)
GFR, EST AFRICAN AMERICAN: 50 mL/min — AB (ref >60)
GLUCOSE: 331 mg/dL — AB (ref 65–99)
POTASSIUM: 4 mmol/L (ref 3.5–5.1)
Sodium: 137 mmol/L (ref 135–145)

## 2016-07-14 LAB — GLUCOSE, CAPILLARY
GLUCOSE-CAPILLARY: 316 mg/dL — AB (ref 65–99)
Glucose-Capillary: 294 mg/dL — ABNORMAL HIGH (ref 65–99)
Glucose-Capillary: 205 mg/dL — ABNORMAL HIGH (ref 65–99)
Glucose-Capillary: 360 mg/dL — ABNORMAL HIGH (ref 65–99)
Glucose-Capillary: 275 mg/dL — ABNORMAL HIGH (ref 65–99)

## 2016-07-14 MED ORDER — HYDROCOD POLST-CPM POLST ER 10-8 MG/5ML PO SUER
5.0000 mL | Freq: Two times a day (BID) | ORAL | Status: DC | PRN
  Administered 2016-07-14 – 2016-07-15 (×2): 5 mL via ORAL
  Filled 2016-07-14 (×2): qty 5

## 2016-07-14 MED ORDER — MENTHOL 3 MG MT LOZG
1.0000 | LOZENGE | OROMUCOSAL | Status: DC | PRN
  Filled 2016-07-14: qty 9

## 2016-07-14 MED ORDER — INSULIN GLARGINE 100 UNIT/ML ~~LOC~~ SOLN
15.0000 [IU] | Freq: Every day | SUBCUTANEOUS | Status: DC
  Administered 2016-07-14 – 2016-07-18 (×5): 15 [IU] via SUBCUTANEOUS
  Filled 2016-07-14 (×5): qty 0.15

## 2016-07-14 NOTE — Progress Notes (Signed)
Patient ID: Wendy Summers, female   DOB: [birth year], 66 y.o.   MRN: [id number]   [facility] PROGRESS NOTE  Wendy Summers:[id number] DOB: 1950-09-08 DOA: 07/13/2016 PCP: [facility] [employer]  HPI/Subjective: Patient with audible wheezing heard without even the stethoscope. Patient still short of breath and coughing a lot. Did not sleep much last night. As per nurse no further diarrhea  Objective: Vitals:   07/14/16 0830 07/14/16 1221  BP: (!) 118/47 (!) 145/66  Pulse: 87 82  Resp: (!) 24   Temp: 97.5 F (36.4 C) 98.2 F (36.8 C)    Filed Weights   07/12/16 2105 07/13/16 0646 07/14/16 0503  Weight: 104.3 kg (230 lb) 106.4 kg (234 lb 9.6 oz) 107.8 kg (237 lb 9.6 oz)    ROS: Review of Systems  Constitutional: Positive for fever. Negative for chills.  Eyes: Negative for blurred vision.  Respiratory: Positive for cough, shortness of breath and wheezing.   Cardiovascular: Negative for chest pain.  Gastrointestinal: Negative for abdominal pain, constipation, diarrhea, nausea and vomiting.  Genitourinary: Negative for dysuria.  Musculoskeletal: Negative for joint pain.  Neurological: Negative for dizziness and headaches.   Exam: Physical Exam  Constitutional: She is oriented to person, place, and time.  HENT:  Nose: No mucosal edema.  Mouth/Throat: No oropharyngeal exudate or posterior oropharyngeal edema.  Eyes: Conjunctivae, EOM and lids are normal. Pupils are equal, round, and reactive to light.  Neck: No JVD present. Carotid bruit is not present. No edema present. No thyroid mass and no thyromegaly present.  Cardiovascular: S1 normal and S2 normal.  Exam reveals no gallop.   No murmur heard. Pulses:      Dorsalis pedis pulses are 2+ on the right side, and 2+ on the left side.  Respiratory: No respiratory distress. She has decreased breath sounds in the right upper field, the right middle field, the right lower field, the left upper field, the left middle  field and the left lower field. She has wheezes in the right upper field, the right middle field, the right lower field, the left upper field, the left middle field and the left lower field. She has no rhonchi. She has no rales.  Audible wheezing heard without the stethoscope.  GI: Soft. Bowel sounds are normal. There is no tenderness.  Musculoskeletal:       Right ankle: She exhibits swelling.       Left ankle: She exhibits swelling.  Lymphadenopathy:    She has no cervical adenopathy.  Neurological: She is alert and oriented to person, place, and time. No cranial nerve deficit.  Skin: Skin is warm. No rash noted. Nails show no clubbing.  Psychiatric: She has a normal mood and affect.      Data Reviewed: Basic Metabolic Panel:  Recent Labs Lab 07/13/16 0115 07/13/16 0736 07/14/16 0532  NA 138  --  137  K 3.8  --  4.0  CL 105  --  104  CO2 23  --  23  GLUCOSE 151*  --  331*  BUN 33*  --  34*  CREATININE 1.77* 1.52* 1.29*  CALCIUM 8.6*  --  8.8*   Liver Function Tests:  Recent Labs Lab 07/13/16 0115  AST 21  ALT 17  ALKPHOS 74  BILITOT 0.4  PROT 7.0  ALBUMIN 3.6   CBC:  Recent Labs Lab 07/13/16 0029 07/13/16 0736 07/14/16 0532  WBC 11.7* 8.5 11.4*  NEUTROABS 6.3  --   --   HGB  13.1 13.0 12.6  HCT 39.1 38.5 37.9  MCV 90.6 93.1 92.0  PLT 266 219 260   Cardiac Enzymes:  Recent Labs Lab 07/13/16 0115 07/13/16 0736 07/13/16 1314 07/13/16 1903  TROPONINI <0.03 <0.03 <0.03 <0.03    CBG:  Recent Labs Lab 07/13/16 1133 07/13/16 1652 07/14/16 0534 07/14/16 0818 07/14/16 1142  GLUCAP 391* 380* 316* 294* 205*    Recent Results (from the past 240 hour(s))  Culture, blood (routine x 2)     Status: None (Preliminary result)   Collection Time: 07/13/16  1:10 AM  Result Value Ref Range Status   Specimen Description BLOOD Blood Culture adequate volume  Final   Special Requests   Final    BOTTLES DRAWN AEROBIC AND ANAEROBIC BLOOD RIGHT HAND    Culture NO GROWTH 1 DAY  Final   Report Status PENDING  Incomplete  Culture, blood (routine x 2)     Status: None (Preliminary result)   Collection Time: 07/13/16  1:17 AM  Result Value Ref Range Status   Specimen Description BLOOD Blood Culture adequate volume  Final   Special Requests   Final    BOTTLES DRAWN AEROBIC AND ANAEROBIC BLOOD LEFT HAND   Culture NO GROWTH 1 DAY  Final   Report Status PENDING  Incomplete  Urine culture     Status: Abnormal (Preliminary result)   Collection Time: 07/13/16  3:15 AM  Result Value Ref Range Status   Specimen Description URINE, RANDOM  Final   Special Requests NONE  Final   Culture (A)  Final    >=100,000 COLONIES/mL ESCHERICHIA COLI SUSCEPTIBILITIES TO FOLLOW Performed at [facility], [address] [place], [place] [address]    Report Status PENDING  Incomplete     Studies: Dg Chest Port 1 View  Result Date: 07/13/2016 CLINICAL DATA:  Cough x3 days with fever EXAM: PORTABLE CHEST 1 VIEW COMPARISON:  03/10/2015 CXR FINDINGS: Stable cardiomegaly. No aortic aneurysm. Mild stable interstitial prominence may reflect chronic bronchitic change. No alveolar consolidation or CHF. No effusion. No acute nor suspicious osseous abnormalities. IMPRESSION: Stable cardiomegaly.  Chronic bronchitic change of the lungs. Electronically Signed   By: Tollie Ethavid  Kwon M.D.   On: 07/13/2016 02:37    Scheduled Meds: . albuterol  2.5 mg Nebulization Q6H  . budesonide (PULMICORT) nebulizer solution  0.5 mg Nebulization BID  . carvedilol  6.25 mg Oral BID WC  . enoxaparin (LOVENOX) injection  40 mg Subcutaneous Q24H  . glipiZIDE  10 mg Oral QAC breakfast  . insulin aspart  0-15 Units Subcutaneous TID WC  . insulin aspart  0-5 Units Subcutaneous QHS  . insulin glargine  15 Units Subcutaneous Daily  . methylPREDNISolone (SOLU-MEDROL) injection  40 mg Intravenous Daily  . sodium chloride flush  3 mL Intravenous Q12H   Continuous Infusions: . cefTRIAXone  (ROCEPHIN)  IV Stopped (07/14/16 1002)    Assessment/Plan:  1. Asthmatic bronchitis. Poor air entry and wheezing throughout entire lung field. Continue Solu-Medrol to 40 mg IV daily. Added budesonide nebulizers. Continue DuoNeb nebulizer solution. Empiric Rocephin. 2. Acute cystitis with hematuria. Urine culture growing Escherichia coli.  Empiric Rocephin. 3. Type 2 diabetes mellitus. Sugars will be high on steroids. On sliding scale insulin and glipizide. Added low-dose Lantus this morning. My hemoglobin A1c is still pending 4. Acute kidney injury. Improved with fluid. Stop IV fluid today. 5. Near syncope. Likely with bronchitis and coughing 6. Essential hypertension. Stop Coreg with bronchospasm. Already holding lisinopril HCT  Code  Status:     Code Status Orders        Start     Ordered   07/13/16 0652  Full code  Continuous     07/13/16 0651    Code Status History    Date Active Date Inactive Code Status Order ID Comments User Context   This patient has a current code status but no historical code status.      Disposition Plan: Evaluate respiratory status on a daily basis  Antibiotics:  Rocephin  Time spent: 26 minutes  Alford Highland  [employer]

## 2016-07-14 NOTE — Progress Notes (Signed)
*  PRELIMINARY RESULTS* Echocardiogram 2D Echocardiogram has been performed.  Cristela BlueHege, Arris Meyn 07/14/2016, 8:57 AM

## 2016-07-14 NOTE — Progress Notes (Signed)
Inpatient Diabetes Program Recommendations  AACE/ADA: New Consensus Statement on Inpatient Glycemic Control (2015)  Target Ranges:  Prepandial:   less than 140 mg/dL      Peak postprandial:   less than 180 mg/dL (1-2 hours)      Critically ill patients:  140 - 180 mg/dL   Lab Results  Component Value Date   GLUCAP 294 (H) 07/14/2016    Review of Glycemic Control  Results for [name], Wendy J (MRN [id number]) as of 07/14/2016 10:53  Ref. Range 07/13/2016 07:37 07/13/2016 11:33 07/13/2016 16:52 07/14/2016 05:34 07/14/2016 08:18  Glucose-Capillary Latest Ref Range: 65 - 99 mg/dL 782292 (H) 956391 (H) 213380 (H) 316 (H) 294 (H)   Diabetes history: Type 2 Outpatient Diabetes medications: Glucotrol 10mg  qday, Glucophage 1000mg  bid Current orders for Inpatient glycemic control: Lantus 15 units qday, Novolog 0-15 units tid, Novolog 0-5 units qhs, Glucotrol 10mg  qday  Inpatient Diabetes Program Recommendations:  Consider d/c Glucotrol while patient is in hospital.   Consider increasing Lantus insulin to 22 units q day (0.2units/kg)- fasting blood sugars elevated  While the patient is on steroids, consider adding Novolog 6 units tid with meals (hold if the patient eats less than 50%)  Susette RacerJulie Wakeelah Solan, RN, BA, [place], CDE Diabetes Coordinator Inpatient Diabetes Program  [phone] (Team Pager) [phone] [facility] Office) 07/14/2016 11:00 AM

## 2016-07-14 NOTE — Progress Notes (Signed)
[facility]  Patient: Wendy Summers / Admit Date: 07/13/2016 / Date of Encounter: 07/14/2016, 8:53 AM   Subjective: Patient with significant wheezing shortness of breath cough and congestion from bronchitis. No evidence of the significant rhythm disturbances or syncope or presyncope. Patient has small amount of atypical chest discomfort more consistent with the bronchitis rather than acute coronary syndrome or true angina  Review of Systems: Positive for: Chest pain cough congestion Negative for: Vision change, hearing change, syncope, dizziness, nausea, vomiting,diarrhea, bloody stool, stomach pain, positive for cough, congestion, negative for diaphoresis, urinary frequency, urinary pain,skin lesions, skin rashes Others previously listed  Objective: Telemetry: Normal sinus rhythm Physical Exam: Blood pressure (!) 118/47, pulse 87, temperature 97.5 F (36.4 C), temperature source Oral, resp. rate (!) 24, height 5\' 3"  (1.6 m), weight 107.8 kg (237 lb 9.6 oz), SpO2 96 %. Body mass index is 42.09 kg/m. General: Well developed, well nourished, in no acute distress. Head: Normocephalic, atraumatic, sclera non-icteric, no xanthomas, nares are without discharge. Neck: No apparent masses Lungs: Normal respirations with diffuse wheezes, some rhonchi, no rales , no crackles   Heart: Regular rate and rhythm, normal S1 S2, no murmur, no rub, no gallop, PMI is normal size and placement, carotid upstroke normal without bruit, jugular venous pressure normal Abdomen: Soft, non-tender, non-distended with normoactive bowel sounds. No hepatosplenomegaly. Abdominal aorta is normal size without bruit Extremities: No edema, no clubbing, no cyanosis, no ulcers,  Peripheral: 2+ radial, 2+ femoral, 2+ dorsal pedal pulses Neuro: Alert and oriented. Moves all extremities spontaneously. Psych:  Responds to questions appropriately with a normal affect.   Intake/Output Summary  (Last 24 hours) at 07/14/16 0853 Last data filed at 07/14/16 0540  Gross per 24 hour  Intake             1200 ml  Output             1000 ml  Net              200 ml    Inpatient Medications:  . albuterol  2.5 mg Nebulization Q6H  . budesonide (PULMICORT) nebulizer solution  0.5 mg Nebulization BID  . carvedilol  6.25 mg Oral BID WC  . enoxaparin (LOVENOX) injection  40 mg Subcutaneous Q24H  . glipiZIDE  10 mg Oral QAC breakfast  . insulin aspart  0-15 Units Subcutaneous TID WC  . insulin aspart  0-5 Units Subcutaneous QHS  . insulin glargine  15 Units Subcutaneous Daily  . methylPREDNISolone (SOLU-MEDROL) injection  40 mg Intravenous Daily  . sodium chloride flush  3 mL Intravenous Q12H   Infusions:  . sodium chloride 75 mL/hr at 07/13/16 2344  . cefTRIAXone (ROCEPHIN)  IV Stopped (07/13/16 0950)    Labs:  Recent Labs  07/13/16 0115 07/13/16 0736 07/14/16 0532  NA 138  --  137  K 3.8  --  4.0  CL 105  --  104  CO2 23  --  23  GLUCOSE 151*  --  331*  BUN 33*  --  34*  CREATININE 1.77* 1.52* 1.29*  CALCIUM 8.6*  --  8.8*    Recent Labs  07/13/16 0115  AST 21  ALT 17  ALKPHOS 74  BILITOT 0.4  PROT 7.0  ALBUMIN 3.6    Recent Labs  07/13/16 0029 07/13/16 0736 07/14/16 0532  WBC 11.7* 8.5 11.4*  NEUTROABS 6.3  --   --   HGB 13.1 13.0 12.6  HCT 39.1 38.5  37.9  MCV 90.6 93.1 92.0  PLT 266 219 260    Recent Labs  07/13/16 0115 07/13/16 0736 07/13/16 1314 07/13/16 1903  TROPONINI <0.03 <0.03 <0.03 <0.03   Invalid input(s): POCBNP No results for input(s): HGBA1C in the last 72 hours.   Weights: Filed Weights   07/12/16 2105 07/13/16 0646 07/14/16 0503  Weight: 104.3 kg (230 lb) 106.4 kg (234 lb 9.6 oz) 107.8 kg (237 lb 9.6 oz)     Radiology/Studies:  Dg Chest Port 1 View  Result Date: 07/13/2016 CLINICAL DATA:  Cough x3 days with fever EXAM: PORTABLE CHEST 1 VIEW COMPARISON:  03/10/2015 CXR FINDINGS: Stable cardiomegaly. No aortic aneurysm.  Mild stable interstitial prominence may reflect chronic bronchitic change. No alveolar consolidation or CHF. No effusion. No acute nor suspicious osseous abnormalities. IMPRESSION: Stable cardiomegaly.  Chronic bronchitic change of the lungs. Electronically Signed   By: Tollie Eth M.D.   On: 07/13/2016 02:37     Assessment and Recommendation  66 y.o. female with the known chronic kidney disease stage II chronic obstructive pulmonary disease with respiratory failure and acute bronchitis diabetes without complication essential hypertension having a near syncopal episode more consistent with hypoxia and cough induced dizziness without evidence of myocardial infarction 1. Continue supportive care for bronchitis and respiratory failure with inhalers and oxygenation and antibiotics 2. Continue carvedilol for hypertension control without change 3. Continue telemetry for now watching for rhythm disturbances and begin ambulation and following for primary causes of possible near syncope 4. No further cardiac diagnostics necessary at this time 5. Begin ambulation and follow for need for adjustments of medications or further investigation  Signed, Arnoldo Hooker M.D. FACC

## 2016-07-14 NOTE — Evaluation (Signed)
Physical Therapy Evaluation Patient Details Name: Wendy Summers MRN: [id number] DOB: [birth year] Today's Date: 07/14/2016   History of Present Illness  presented to ER secondary to worsening SOB, wheezing, dizziness; admitted with acute bronchitis with asthma exacerbation, near syncope.  Clinical Impression  Upon evaluation, patient alert and oriented; follows all commands and demonstrates good insight/safety awareness.  Bilat UE/LE strength and ROM grossly symmetrical and WFL.  Able to complete bed mobility, sit/stand, basic transfers and gait (30' x2) without assist device, mod indep.  Mod auditory wheezing, SOB with exertion, but maintains sats >95% on RA throughout.  No buckling or LOB with gait efforts, reports feeling at baseline for all functional activities. No acute PT needs identified at this time.  Do encourage continued mobility throughout unit with nursing staff throughout remaining hospitalization. Patient voiced understanding.  Will complete order at this time; please reconsult should needs change.    Follow Up Recommendations No PT follow up (recommend continued mobilization with nursing/staff during remaining mobilization)    Equipment Recommendations       Recommendations for Other Services       Precautions / Restrictions Precautions Precautions: Fall Restrictions Weight Bearing Restrictions: No      Mobility  Bed Mobility Overal bed mobility: Modified Independent                Transfers Overall transfer level: Modified independent Equipment used: None             General transfer comment: fair/good power in bilat LEs, no buckling or LOB; minimal/no use of UEs to assist  Ambulation/Gait Ambulation/Gait assistance: Modified independent (Device/Increase time) Ambulation Distance (Feet): 30 Feet (x2) Assistive device: None       General Gait Details: broad BOS, decreased step height/length; no buckling or LOB; feels at/near baseline for  functional mobility.  Stairs            Wheelchair Mobility    Modified Rankin (Stroke Patients Only)       Balance Overall balance assessment: Modified Independent                                           Pertinent Vitals/Pain Pain Assessment: No/denies pain    Home Living Family/patient expects to be discharged to:: Private residence Living Arrangements: Spouse/significant other Available Help at Discharge: Family Type of Home: House Home Access: Stairs to enter Entrance Stairs-Rails: None (porch rail) Secretary/administratorntrance Stairs-Number of Steps: 1 Home Layout: One level        Prior Function Level of Independence: Independent         Comments: Indep with ADLs, household and community mobility; no home O2.  Denies recent fall history.     Hand Dominance        Extremity/Trunk Assessment   Upper Extremity Assessment Upper Extremity Assessment: Overall WFL for tasks assessed    Lower Extremity Assessment Lower Extremity Assessment: Overall WFL for tasks assessed       Communication   Communication: No difficulties  Cognition Arousal/Alertness: Awake/alert Behavior During Therapy: WFL for tasks assessed/performed Overall Cognitive Status: Within Functional Limits for tasks assessed                                        General Comments      Exercises Other  Exercises Other Exercises: Toilet transfer, ambulatory without assist device, mod indep without assist device; sit/stand from standard toilet without assist device, mod indep; standing balance at sink for hand hygiene, mod indep   Assessment/Plan    PT Assessment Patent does not need any further PT services  PT Problem List         PT Treatment Interventions      PT Goals (Current goals can be found in the Care Plan section)  Acute Rehab PT Goals Patient Stated Goal: to return home PT Goal Formulation: All assessment and education complete, DC therapy     Frequency     Barriers to discharge        Co-evaluation               AM-PAC PT "6 Clicks" Daily Activity  Outcome Measure Difficulty turning over in bed (including adjusting bedclothes, sheets and blankets)?: None Difficulty moving from lying on back to sitting on the side of the bed? : None Difficulty sitting down on and standing up from a chair with arms (e.g., wheelchair, bedside commode, etc,.)?: None Help needed moving to and from a bed to chair (including a wheelchair)?: None Help needed walking in hospital room?: None Help needed climbing 3-5 steps with a railing? : A Little 6 Click Score: 23    End of Session Equipment Utilized During Treatment: Gait belt Activity Tolerance: Patient tolerated treatment well Patient left: in chair;with chair alarm set;with call bell/phone within reach Nurse Communication: Mobility status PT Visit Diagnosis: Difficulty in walking, not elsewhere classified (R26.2)    Time: 9604-5409 PT Time Calculation (min) (ACUTE ONLY): 18 min   Charges:   PT Evaluation $PT Eval Low Complexity: 1 Procedure     PT G Codes:   PT G-Codes **NOT FOR INPATIENT CLASS** Functional Assessment Tool Used: AM-PAC 6 Clicks Basic Mobility Functional Limitation: Mobility: Walking and moving around Mobility: Walking and Moving Around Current Status (W1191): At least 1 percent but less than 20 percent impaired, limited or restricted Mobility: Walking and Moving Around Goal Status [phone]): At least 1 percent but less than 20 percent impaired, limited or restricted Mobility: Walking and Moving Around Discharge Status [phone]): At least 1 percent but less than 20 percent impaired, limited or restricted    Ahnyla Mendel H. Manson Passey, PT, DPT, NCS 07/14/16, 1:44 PM [phone]

## 2016-07-15 ENCOUNTER — Inpatient Hospital Stay: Payer: Medicaid Other

## 2016-07-15 LAB — GLUCOSE, CAPILLARY
GLUCOSE-CAPILLARY: 193 mg/dL — AB (ref 65–99)
GLUCOSE-CAPILLARY: 171 mg/dL — AB (ref 65–99)
GLUCOSE-CAPILLARY: 332 mg/dL — AB (ref 65–99)
GLUCOSE-CAPILLARY: 207 mg/dL — AB (ref 65–99)
Glucose-Capillary: 266 mg/dL — ABNORMAL HIGH (ref 65–99)

## 2016-07-15 LAB — URINE CULTURE: Culture: >=100000 — AB

## 2016-07-15 LAB — HEMOGLOBIN A1C
Hgb A1c MFr Bld: 6.7 % — ABNORMAL HIGH (ref 4.8–5.6)
MEAN PLASMA GLUCOSE: 146 mg/dL

## 2016-07-15 LAB — ECHOCARDIOGRAM COMPLETE: WEIGHTICAEL: 3801.6 [oz_av]

## 2016-07-15 MED ORDER — DOXYCYCLINE HYCLATE 100 MG PO TABS
100.0000 mg | ORAL_TABLET | Freq: Two times a day (BID) | ORAL | Status: DC
  Administered 2016-07-15 – 2016-07-18 (×6): 100 mg via ORAL
  Filled 2016-07-15 (×6): qty 1

## 2016-07-15 NOTE — Progress Notes (Signed)
[facility]  Patient: Wendy Summers / Admit Date: 07/13/2016 / Date of Encounter: 07/15/2016, 8:47 AM   Subjective: Patient with significant wheezing shortness of breath cough and congestion from bronchitis with slight improvements with current therapy. No evidence of the significant rhythm disturbances or syncope or presyncope. Patient has small amount of atypical chest discomfort more consistent with the bronchitis rather than acute coronary syndrome or true angina  Review of Systems: Positive for: Chest pain cough congestion with slight improvements Negative for: Vision change, hearing change, syncope, dizziness, nausea, vomiting,diarrhea, bloody stool, stomach pain, positive for cough, congestion, negative for diaphoresis, urinary frequency, urinary pain,skin lesions, skin rashes Others previously listed  Objective: Telemetry: Normal sinus rhythm Physical Exam: Blood pressure (!) 124/91, pulse 72, temperature 97.8 F (36.6 C), temperature source Oral, resp. rate 19, height 5\' 3"  (1.6 m), weight 105.3 kg (232 lb 3.2 oz), SpO2 97 %. Body mass index is 41.13 kg/m. General: Well developed, well nourished, in no acute distress. Head: Normocephalic, atraumatic, sclera non-icteric, no xanthomas, nares are without discharge. Neck: No apparent masses Lungs: Normal respirations with diffuse wheezes, some rhonchi, no rales , few basilar crackles   Heart: Regular rate and rhythm, normal S1 S2, no murmur, no rub, no gallop, PMI is normal size and placement, carotid upstroke normal without bruit, jugular venous pressure normal Abdomen: Soft, non-tender, non-distended with normoactive bowel sounds. No hepatosplenomegaly. Abdominal aorta is normal size without bruit Extremities: No edema, no clubbing, no cyanosis, no ulcers,  Peripheral: 2+ radial, 2+ femoral, 2+ dorsal pedal pulses Neuro: Alert and oriented. Moves all extremities spontaneously. Psych:  Responds to  questions appropriately with a normal affect.   Intake/Output Summary (Last 24 hours) at 07/15/16 0847 Last data filed at 07/15/16 0517  Gross per 24 hour  Intake             1030 ml  Output             2100 ml  Net            -1070 ml    Inpatient Medications:  . albuterol  2.5 mg Nebulization Q6H  . budesonide (PULMICORT) nebulizer solution  0.5 mg Nebulization BID  . enoxaparin (LOVENOX) injection  40 mg Subcutaneous Q24H  . glipiZIDE  10 mg Oral QAC breakfast  . insulin aspart  0-15 Units Subcutaneous TID WC  . insulin aspart  0-5 Units Subcutaneous QHS  . insulin glargine  15 Units Subcutaneous Daily  . methylPREDNISolone (SOLU-MEDROL) injection  40 mg Intravenous Daily  . sodium chloride flush  3 mL Intravenous Q12H   Infusions:  . cefTRIAXone (ROCEPHIN)  IV 1 g (07/15/16 0827)    Labs:  Recent Labs  07/13/16 0115 07/13/16 0736 07/14/16 0532  NA 138  --  137  K 3.8  --  4.0  CL 105  --  104  CO2 23  --  23  GLUCOSE 151*  --  331*  BUN 33*  --  34*  CREATININE 1.77* 1.52* 1.29*  CALCIUM 8.6*  --  8.8*    Recent Labs  07/13/16 0115  AST 21  ALT 17  ALKPHOS 74  BILITOT 0.4  PROT 7.0  ALBUMIN 3.6    Recent Labs  07/13/16 0029 07/13/16 0736 07/14/16 0532  WBC 11.7* 8.5 11.4*  NEUTROABS 6.3  --   --   HGB 13.1 13.0 12.6  HCT 39.1 38.5 37.9  MCV 90.6 93.1 92.0  PLT 266 219  260    Recent Labs  07/13/16 0115 07/13/16 0736 07/13/16 1314 07/13/16 1903  TROPONINI <0.03 <0.03 <0.03 <0.03   Invalid input(s): POCBNP  Recent Labs  07/13/16 1903  HGBA1C 6.7*     Weights: Filed Weights   07/13/16 0646 07/14/16 0503 07/15/16 0514  Weight: 106.4 kg (234 lb 9.6 oz) 107.8 kg (237 lb 9.6 oz) 105.3 kg (232 lb 3.2 oz)     Radiology/Studies:  Dg Chest Port 1 View  Result Date: 07/13/2016 CLINICAL DATA:  Cough x3 days with fever EXAM: PORTABLE CHEST 1 VIEW COMPARISON:  03/10/2015 CXR FINDINGS: Stable cardiomegaly. No aortic aneurysm. Mild stable  interstitial prominence may reflect chronic bronchitic change. No alveolar consolidation or CHF. No effusion. No acute nor suspicious osseous abnormalities. IMPRESSION: Stable cardiomegaly.  Chronic bronchitic change of the lungs. Electronically Signed   By: Tollie Eth M.D.   On: 07/13/2016 02:37     Assessment and Recommendation  66 y.o. female with the known chronic kidney disease stage II chronic obstructive pulmonary disease with respiratory failure and acute bronchitis diabetes without complication essential hypertension having a near syncopal episode more consistent with hypoxia and cough induced dizziness without evidence of myocardial infarction 1. Continue supportive care for bronchitis and respiratory failure with inhalers and oxygenation and antibiotics 2. Continue carvedilol for hypertension control without change 3. Continue telemetry for now watching for rhythm disturbances and begin ambulation and following for primary causes of possible near syncope 4. No further cardiac diagnostics necessary at this time 5. Begin ambulation and follow for need for adjustments of medications or further investigation 6. Consider discontinuation of beta blocker due to bronchospasm with severe wheezing still continuing 7. Call if further questions  Signed, Arnoldo Hooker M.D. FACC

## 2016-07-15 NOTE — Plan of Care (Signed)
Problem: Activity: Goal: Risk for activity intolerance will decrease Outcome: Not Progressing Dyspnea with minimal exertion continues.  Hacking nonproductive cough noted.  Prn meds given with relief.

## 2016-07-15 NOTE — Progress Notes (Addendum)
Patient ID: Wendy Summers, female   DOB: [birth year], 66 y.o.   MRN: [id number]    [employer] PROGRESS NOTE  Wendy Summers NWG:[id number] DOB: 20-Feb-1951 DOA: 07/13/2016 PCP: [facility] [employer]  HPI/Subjective: Patient still with audible wheezing. Still with cough and shortness of breath. No further diarrhea. Appetite okay.  Objective: Vitals:   07/15/16 0826 07/15/16 1131  BP: (!) 124/91 129/63  Pulse: 72 83  Resp: 19 19  Temp:  98.3 F (36.8 C)    Filed Weights   07/13/16 0646 07/14/16 0503 07/15/16 0514  Weight: 106.4 kg (234 lb 9.6 oz) 107.8 kg (237 lb 9.6 oz) 105.3 kg (232 lb 3.2 oz)    ROS: Review of Systems  Constitutional: Negative for chills and fever.  Eyes: Negative for blurred vision.  Respiratory: Positive for cough, shortness of breath and wheezing.   Cardiovascular: Negative for chest pain.  Gastrointestinal: Negative for abdominal pain, constipation, diarrhea, nausea and vomiting.  Genitourinary: Negative for dysuria.  Musculoskeletal: Negative for joint pain.  Neurological: Negative for dizziness and headaches.   Exam: Physical Exam  Constitutional: She is oriented to person, place, and time.  HENT:  Nose: No mucosal edema.  Mouth/Throat: No oropharyngeal exudate or posterior oropharyngeal edema.  Eyes: Conjunctivae, EOM and lids are normal. Pupils are equal, round, and reactive to light.  Neck: No JVD present. Carotid bruit is not present. No edema present. No thyroid mass and no thyromegaly present.  Cardiovascular: S1 normal and S2 normal.  Exam reveals no gallop.   No murmur heard. Pulses:      Dorsalis pedis pulses are 2+ on the right side, and 2+ on the left side.  Respiratory: No respiratory distress. She has decreased breath sounds in the right upper field, the right middle field, the right lower field, the left upper field, the left middle field and the left lower field. She has wheezes in the right upper field, the right  middle field, the right lower field, the left upper field, the left middle field and the left lower field. She has no rhonchi. She has no rales.  Audible wheezing heard without the stethoscope.  GI: Soft. Bowel sounds are normal. There is no tenderness.  Musculoskeletal:       Right ankle: She exhibits swelling.       Left ankle: She exhibits swelling.  Lymphadenopathy:    She has no cervical adenopathy.  Neurological: She is alert and oriented to person, place, and time. No cranial nerve deficit.  Skin: Skin is warm. No rash noted. Nails show no clubbing.  Psychiatric: She has a normal mood and affect.      Data Reviewed: Basic Metabolic Panel:  Recent Labs Lab 07/13/16 0115 07/13/16 0736 07/14/16 0532  NA 138  --  137  K 3.8  --  4.0  CL 105  --  104  CO2 23  --  23  GLUCOSE 151*  --  331*  BUN 33*  --  34*  CREATININE 1.77* 1.52* 1.29*  CALCIUM 8.6*  --  8.8*   Liver Function Tests:  Recent Labs Lab 07/13/16 0115  AST 21  ALT 17  ALKPHOS 74  BILITOT 0.4  PROT 7.0  ALBUMIN 3.6   CBC:  Recent Labs Lab 07/13/16 0029 07/13/16 0736 07/14/16 0532  WBC 11.7* 8.5 11.4*  NEUTROABS 6.3  --   --   HGB 13.1 13.0 12.6  HCT 39.1 38.5 37.9  MCV 90.6 93.1 92.0  PLT 266  219 260   Cardiac Enzymes:  Recent Labs Lab 07/13/16 0115 07/13/16 0736 07/13/16 1314 07/13/16 1903  TROPONINI <0.03 <0.03 <0.03 <0.03    CBG:  Recent Labs Lab 07/14/16 2113 07/15/16 0621 07/15/16 0730 07/15/16 1130 07/15/16 1630  GLUCAP 275* 193* 171* 266* 332*    Recent Results (from the past 240 hour(s))  Culture, blood (routine x 2)     Status: None (Preliminary result)   Collection Time: 07/13/16  1:10 AM  Result Value Ref Range Status   Specimen Description BLOOD Blood Culture adequate volume  Final   Special Requests   Final    BOTTLES DRAWN AEROBIC AND ANAEROBIC BLOOD RIGHT HAND   Culture NO GROWTH 2 DAYS  Final   Report Status PENDING  Incomplete  Culture, blood  (routine x 2)     Status: None (Preliminary result)   Collection Time: 07/13/16  1:17 AM  Result Value Ref Range Status   Specimen Description BLOOD Blood Culture adequate volume  Final   Special Requests   Final    BOTTLES DRAWN AEROBIC AND ANAEROBIC BLOOD LEFT HAND   Culture NO GROWTH 2 DAYS  Final   Report Status PENDING  Incomplete  Urine culture     Status: Abnormal   Collection Time: 07/13/16  3:15 AM  Result Value Ref Range Status   Specimen Description URINE, RANDOM  Final   Special Requests NONE  Final   Culture >=100,000 COLONIES/mL ESCHERICHIA COLI (A)  Final   Report Status 07/15/2016 FINAL  Final   Organism ID, Bacteria ESCHERICHIA COLI (A)  Final      Susceptibility   Escherichia coli - MIC*    AMPICILLIN 8 SENSITIVE Sensitive     CEFAZOLIN <=4 SENSITIVE Sensitive     CEFTRIAXONE <=1 SENSITIVE Sensitive     CIPROFLOXACIN <=0.25 SENSITIVE Sensitive     GENTAMICIN <=1 SENSITIVE Sensitive     IMIPENEM <=0.25 SENSITIVE Sensitive     NITROFURANTOIN <=16 SENSITIVE Sensitive     TRIMETH/SULFA <=20 SENSITIVE Sensitive     AMPICILLIN/SULBACTAM 4 SENSITIVE Sensitive     PIP/TAZO <=4 SENSITIVE Sensitive     Extended ESBL NEGATIVE Sensitive     * >=100,000 COLONIES/mL ESCHERICHIA COLI     Studies: Dg Chest 2 View  Result Date: 07/15/2016 CLINICAL DATA:  Shortness of breath and fever for 4 days. EXAM: CHEST  2 VIEW COMPARISON:  07/13/2016 and prior radiographs FINDINGS: Cardiomegaly and mild peribronchial thickening again noted. This is a mildly low volume film. There is no evidence of focal airspace disease, pulmonary edema, suspicious pulmonary nodule/mass, pleural effusion, or pneumothorax. No acute bony abnormalities are identified. IMPRESSION: Cardiomegaly without evidence of acute cardiopulmonary disease. Mild chronic peribronchial thickening. Electronically Signed   By: Harmon Pier M.D.   On: 07/15/2016 15:54    Scheduled Meds: . albuterol  2.5 mg Nebulization Q6H  .  budesonide (PULMICORT) nebulizer solution  0.5 mg Nebulization BID  . enoxaparin (LOVENOX) injection  40 mg Subcutaneous Q24H  . glipiZIDE  10 mg Oral QAC breakfast  . insulin aspart  0-15 Units Subcutaneous TID WC  . insulin aspart  0-5 Units Subcutaneous QHS  . insulin glargine  15 Units Subcutaneous Daily  . methylPREDNISolone (SOLU-MEDROL) injection  40 mg Intravenous Daily  . sodium chloride flush  3 mL Intravenous Q12H   Continuous Infusions: . cefTRIAXone (ROCEPHIN)  IV Stopped (07/15/16 0900)    Assessment/Plan:  1. Asthmatic bronchitis. Poor air entry and wheezing throughout entire lung field. Continue  Solu-Medrol to 40 mg IV daily. Added budesonide nebulizers. Continue DuoNeb nebulizer solution. Empiric Rocephin.  Repeat chest x-ray. Add doxycycline to cover atypicals 2. Acute cystitis with hematuria. Urine culture growing Escherichia coli.  On Rocephin. 3. Type 2 diabetes mellitus. Sugars will be high on steroids. On sliding scale insulin and glipizide. Added low-dose Lantus this morning. Hemoglobin A1c 6.7. Added short acting insulin prior to meals 4. Acute kidney injury on chronic kidney disease stage III. Improved with fluid. Stop IV fluid today. 5. Near syncope. Likely with bronchitis and coughing 6. Essential hypertension. Stop Coreg with bronchospasm. Already holding lisinopril HCT  Code Status:     Code Status Orders        Start     Ordered   07/13/16 0652  Full code  Continuous     07/13/16 0651    Code Status History    Date Active Date Inactive Code Status Order ID Comments User Context   This patient has a current code status but no historical code status.      Disposition Plan: Evaluate respiratory status on a daily basis  Antibiotics:  Rocephin  Add doxycycline  Time spent: 24 minutes. Husband at the bedside  Alford HighlandWIETING, Sharell Hilmer  [employer]

## 2016-07-15 NOTE — Progress Notes (Signed)
Inpatient Diabetes Program Recommendations  AACE/ADA: New Consensus Statement on Inpatient Glycemic Control (2015)  Target Ranges:  Prepandial:   less than 140 mg/dL      Peak postprandial:   less than 180 mg/dL (1-2 hours)      Critically ill patients:  140 - 180 mg/dL  Results for [name], Wendy J (MRN [id number]) as of 07/15/2016 09:56  Ref. Range 07/14/2016 08:18 07/14/2016 11:42 07/14/2016 16:53 07/14/2016 21:13 07/15/2016 06:21 07/15/2016 07:30  Glucose-Capillary Latest Ref Range: 65 - 99 mg/dL 409294 (H) 811205 (H) 914360 (H) 275 (H) 193 (H) 171 (H)  Results for [name], Wendy J (MRN [id number]) as of 07/15/2016 09:56  Ref. Range 07/13/2016 19:03  Hemoglobin A1C Latest Ref Range: 4.8 - 5.6 % 6.7 (H)    Review of Glycemic Control  Current orders for Inpatient glycemic control: Lantus 15 units daily, Novolog 0-15 units TID with meals, Novolog 0-5 units QHS, Glipizide 10 mg QAM  Inpatient Diabetes Program Recommendations: Insulin - Meal Coverage: While inpatient and ordered steroids, please consider ordering Novolog 6 units TID with meals if patient eats at least 50% of meals. HgbA1C: A1C 6.7% on 07/13/16 indicating an average glucose of 146 mg/dl over the past 2-3 months.  Thanks, Orlando PennerMarie Kelsea Mousel, RN, MSN, CDE Diabetes Coordinator Inpatient Diabetes Program [phone] (Team Pager from 8am to 5pm)

## 2016-07-15 NOTE — Care Management (Signed)
Admitted with cough and fever with near syncope.  Currently is not requiring oxygen. No discharge needs identified by members of the care team

## 2016-07-16 LAB — BASIC METABOLIC PANEL
Anion gap: 9 (ref 5–15)
BUN: 37 mg/dL — ABNORMAL HIGH (ref 6–20)
CALCIUM: 9.4 mg/dL (ref 8.9–10.3)
CHLORIDE: 103 mmol/L (ref 101–111)
CO2: 26 mmol/L (ref 22–32)
CREATININE: 1.21 mg/dL — AB (ref 0.44–1.00)
GFR calc Af Amer: 54 mL/min — ABNORMAL LOW (ref >60)
GFR calc non Af Amer: 46 mL/min — ABNORMAL LOW (ref >60)
GLUCOSE: 139 mg/dL — AB (ref 65–99)
Potassium: 4.3 mmol/L (ref 3.5–5.1)
Sodium: 138 mmol/L (ref 135–145)

## 2016-07-16 LAB — CBC
HCT: 37.1 % (ref 35.0–47.0)
Hemoglobin: 12.5 g/dL (ref 12.0–16.0)
MCH: 30.4 pg (ref 26.0–34.0)
MCHC: 33.8 g/dL (ref 32.0–36.0)
MCV: 90 fL (ref 80.0–100.0)
PLATELETS: 265 10*3/uL (ref 150–440)
RBC: 4.12 MIL/uL (ref 3.80–5.20)
RDW: 12.7 % (ref 11.5–14.5)
WBC: 12.3 10*3/uL — ABNORMAL HIGH (ref 3.6–11.0)

## 2016-07-16 LAB — GLUCOSE, CAPILLARY
GLUCOSE-CAPILLARY: 136 mg/dL — AB (ref 65–99)
GLUCOSE-CAPILLARY: 169 mg/dL — AB (ref 65–99)
GLUCOSE-CAPILLARY: 338 mg/dL — AB (ref 65–99)
Glucose-Capillary: 455 mg/dL — ABNORMAL HIGH (ref 65–99)

## 2016-07-16 MED ORDER — METHYLPREDNISOLONE SODIUM SUCC 125 MG IJ SOLR
60.0000 mg | Freq: Four times a day (QID) | INTRAMUSCULAR | Status: DC
  Administered 2016-07-16 – 2016-07-17 (×3): 60 mg via INTRAVENOUS
  Filled 2016-07-16 (×3): qty 2

## 2016-07-16 MED ORDER — BENZONATATE 100 MG PO CAPS
200.0000 mg | ORAL_CAPSULE | Freq: Three times a day (TID) | ORAL | Status: DC
  Administered 2016-07-16 – 2016-07-18 (×7): 200 mg via ORAL
  Filled 2016-07-16 (×7): qty 2

## 2016-07-16 MED ORDER — HYDROCOD POLST-CPM POLST ER 10-8 MG/5ML PO SUER
5.0000 mL | Freq: Two times a day (BID) | ORAL | Status: DC
  Administered 2016-07-16 – 2016-07-18 (×4): 5 mL via ORAL
  Filled 2016-07-16 (×4): qty 5

## 2016-07-16 MED ORDER — ENOXAPARIN SODIUM 40 MG/0.4ML ~~LOC~~ SOLN
40.0000 mg | Freq: Two times a day (BID) | SUBCUTANEOUS | Status: DC
  Administered 2016-07-16 – 2016-07-18 (×4): 40 mg via SUBCUTANEOUS
  Filled 2016-07-16 (×4): qty 0.4

## 2016-07-16 MED ORDER — ALBUTEROL SULFATE (2.5 MG/3ML) 0.083% IN NEBU
2.5000 mg | INHALATION_SOLUTION | RESPIRATORY_TRACT | Status: DC
  Administered 2016-07-16 – 2016-07-18 (×13): 2.5 mg via RESPIRATORY_TRACT
  Filled 2016-07-16 (×13): qty 3

## 2016-07-16 MED ORDER — TIOTROPIUM BROMIDE MONOHYDRATE 18 MCG IN CAPS
18.0000 ug | ORAL_CAPSULE | Freq: Every day | RESPIRATORY_TRACT | Status: DC
  Administered 2016-07-16 – 2016-07-18 (×3): 18 ug via RESPIRATORY_TRACT
  Filled 2016-07-16: qty 5

## 2016-07-16 MED ORDER — INSULIN ASPART 100 UNIT/ML ~~LOC~~ SOLN
0.0000 [IU] | Freq: Three times a day (TID) | SUBCUTANEOUS | Status: DC
  Administered 2016-07-16: 15 [IU] via SUBCUTANEOUS
  Administered 2016-07-17: 3 [IU] via SUBCUTANEOUS
  Administered 2016-07-17: 15 [IU] via SUBCUTANEOUS
  Administered 2016-07-17: 8 [IU] via SUBCUTANEOUS
  Administered 2016-07-17: 11 [IU] via SUBCUTANEOUS
  Administered 2016-07-18: 15 [IU] via SUBCUTANEOUS
  Filled 2016-07-16: qty 8
  Filled 2016-07-16: qty 11
  Filled 2016-07-16 (×2): qty 15
  Filled 2016-07-16: qty 3
  Filled 2016-07-16: qty 15

## 2016-07-16 NOTE — Progress Notes (Signed)
[facility] - [place] at [facility]   PATIENT NAME: Wendy Summers    MR#:  [id number]  DATE OF BIRTH:  Sep 13, 1950  SUBJECTIVE:  CHIEF COMPLAINT:   Chief Complaint  Patient presents with  . Cough   - Admitted with asthmatic bronchitis. Still complains of significant wheezing and also cough. -Remains on room air  REVIEW OF SYSTEMS:  Review of Systems  Constitutional: Negative for chills, fever and malaise/fatigue.  HENT: Negative for ear discharge, hearing loss and nosebleeds.   Eyes: Negative for blurred vision and double vision.  Respiratory: Positive for cough and shortness of breath. Negative for wheezing.   Cardiovascular: Negative for chest pain, palpitations and leg swelling.  Gastrointestinal: Negative for abdominal pain, constipation, diarrhea, nausea and vomiting.  Genitourinary: Negative for dysuria.  Musculoskeletal: Negative for myalgias.  Neurological: Negative for dizziness, speech change, focal weakness, seizures and headaches.  Psychiatric/Behavioral: Negative for depression.    DRUG ALLERGIES:  No Known Allergies  VITALS:  Blood pressure (!) 141/59, pulse 71, temperature 98 F (36.7 C), temperature source Oral, resp. rate 18, height 5\' 3"  (1.6 m), weight 106.6 kg (235 lb 1.6 oz), SpO2 98 %.  PHYSICAL EXAMINATION:  Physical Exam  GENERAL:  66 y.o.-year-old patient lying in the bed with no acute distress.  EYES: Pupils equal, round, reactive to light and accommodation. No scleral icterus. Extraocular muscles intact.  HEENT: Head atraumatic, normocephalic. Oropharynx and nasopharynx clear.  NECK:  Supple, no jugular venous distention. No thyroid enlargement, no tenderness.  LUNGS: tight breath sounds bilaterally, significant expiratory wheezing, no rales,rhonchi or crepitation. No use of accessory muscles of respiration.  CARDIOVASCULAR: S1, S2 normal. No murmurs, rubs, or gallops.  ABDOMEN: Soft, nontender, nondistended. Bowel sounds  present. No organomegaly or mass.  EXTREMITIES: No pedal edema, cyanosis, or clubbing.  NEUROLOGIC: Cranial nerves II through XII are intact. Muscle strength 5/5 in all extremities. Sensation intact. Gait not checked.  PSYCHIATRIC: The patient is alert and oriented x 3.  SKIN: No obvious rash, lesion, or ulcer.    LABORATORY PANEL:   CBC  Recent Labs Lab 07/16/16 0515  WBC 12.3*  HGB 12.5  HCT 37.1  PLT 265   ------------------------------------------------------------------------------------------------------------------  Chemistries   Recent Labs Lab 07/13/16 0115  07/16/16 0515  NA 138  < > 138  K 3.8  < > 4.3  CL 105  < > 103  CO2 23  < > 26  GLUCOSE 151*  < > 139*  BUN 33*  < > 37*  CREATININE 1.77*  < > 1.21*  CALCIUM 8.6*  < > 9.4  AST 21  --   --   ALT 17  --   --   ALKPHOS 74  --   --   BILITOT 0.4  --   --   < > = values in this interval not displayed. ------------------------------------------------------------------------------------------------------------------  Cardiac Enzymes  Recent Labs Lab 07/13/16 1903  TROPONINI <0.03   ------------------------------------------------------------------------------------------------------------------  RADIOLOGY:  Dg Chest 2 View  Result Date: 07/15/2016 CLINICAL DATA:  Shortness of breath and fever for 4 days. EXAM: CHEST  2 VIEW COMPARISON:  07/13/2016 and prior radiographs FINDINGS: Cardiomegaly and mild peribronchial thickening again noted. This is a mildly low volume film. There is no evidence of focal airspace disease, pulmonary edema, suspicious pulmonary nodule/mass, pleural effusion, or pneumothorax. No acute bony abnormalities are identified. IMPRESSION: Cardiomegaly without evidence of acute cardiopulmonary disease. Mild chronic peribronchial thickening. Electronically Signed   By: Harmon PierJeffrey  Hu  M.D.   On: 07/15/2016 15:54    EKG:   Orders placed or performed during the hospital encounter of  07/13/16  . ED EKG within 10 minutes  . ED EKG within 10 minutes  . ED EKG  . ED EKG    ASSESSMENT AND PLAN:   66 year old female with past medical history significant for non-insulin-dependent diabetes mellitus, bronchial asthma presents to the hospital secondary to a dizzy episode followed by coughing and wheezing.  #1 syncope-vasovagal, triggered by cough. -Was monitored on telemetry, no arrhythmias. Discontinue telemetry today.  #2 Asthmatic bronchitis-severe wheezing. Increase the dose of steroids. -On albuterol nebulizers. Also added Spiriva. -Adjust cough medications and monitor. -On doxycycline orally  #3 diabetes mellitus with hyperglycemia secondary to steroids. -Sugars are better controlled now. On Lantus while in the hospital. Metformin on hold Also on Glucotrol  #4 Escherichia coli UTI-urine cultures growing Escherichia coli. On Rocephin at this time. Stop after 5 days total  #5 acute renal failure-on admission. Resolved at this time.  #6 DVT prophylaxis-on Lovenox    All the records are reviewed and case discussed with Care Management/Social Workerr. Management plans discussed with the patient, family and they are in agreement.  CODE STATUS: Full code  TOTAL TIME TAKING CARE OF THIS PATIENT: 36 minutes.   POSSIBLE D/C IN 2 DAYS, DEPENDING ON CLINICAL CONDITION.   Enid Baas M.D on 07/16/2016 at 10:32 AM  Between 7am to 6pm - Pager - [phone]  After 6pm go to [email or web address] - Social research officer, government  [facility]  Office  [phone]  CC: Primary care physician; [employer], [employer]

## 2016-07-16 NOTE — Progress Notes (Signed)
66 y/o F on Lovenox for DVT prophylaxis.   Estimated Creatinine Clearance: 54.9 mL/min (A) (by C-G formula based on SCr of 1.21 mg/dL (H)).  Filed Weights   07/14/16 0503 07/15/16 0514 07/16/16 0514  Weight: 237 lb 9.6 oz (107.8 kg) 232 lb 3.2 oz (105.3 kg) 235 lb 1.6 oz (106.6 kg)   Will increase Lovenox to 40 mg bid for weight > 100 kg and BMI > 40.   Luisa HartScott Harve Spradley, PharmD Clinical Pharmacist

## 2016-07-16 NOTE — Progress Notes (Addendum)
CBG is 455, Dr. Anne HahnWillis notified with new orders given, CBG is to be rechecked at 0300.

## 2016-07-16 NOTE — Progress Notes (Signed)
BP 184/73. Taken shortly after a neb treatment.  20 minutes later  158/81.

## 2016-07-17 LAB — GLUCOSE, CAPILLARY
GLUCOSE-CAPILLARY: 314 mg/dL — AB (ref 65–99)
GLUCOSE-CAPILLARY: 302 mg/dL — AB (ref 65–99)
GLUCOSE-CAPILLARY: 298 mg/dL — AB (ref 65–99)
Glucose-Capillary: 284 mg/dL — ABNORMAL HIGH (ref 65–99)
Glucose-Capillary: 372 mg/dL — ABNORMAL HIGH (ref 65–99)
Glucose-Capillary: 183 mg/dL — ABNORMAL HIGH (ref 65–99)

## 2016-07-17 LAB — BASIC METABOLIC PANEL
ANION GAP: 9 (ref 5–15)
BUN: 38 mg/dL — ABNORMAL HIGH (ref 6–20)
CHLORIDE: 102 mmol/L (ref 101–111)
CO2: 24 mmol/L (ref 22–32)
Calcium: 9.4 mg/dL (ref 8.9–10.3)
Creatinine, Ser: 1.21 mg/dL — ABNORMAL HIGH (ref 0.44–1.00)
GFR calc non Af Amer: 46 mL/min — ABNORMAL LOW (ref >60)
GFR, EST AFRICAN AMERICAN: 54 mL/min — AB (ref >60)
GLUCOSE: 335 mg/dL — AB (ref 65–99)
POTASSIUM: 4.8 mmol/L (ref 3.5–5.1)
Sodium: 135 mmol/L (ref 135–145)

## 2016-07-17 MED ORDER — FUROSEMIDE 10 MG/ML IJ SOLN
40.0000 mg | Freq: Once | INTRAMUSCULAR | Status: AC
  Administered 2016-07-17: 40 mg via INTRAVENOUS
  Filled 2016-07-17: qty 4

## 2016-07-17 MED ORDER — METHYLPREDNISOLONE SODIUM SUCC 125 MG IJ SOLR
60.0000 mg | Freq: Two times a day (BID) | INTRAMUSCULAR | Status: DC
  Administered 2016-07-17: 60 mg via INTRAVENOUS
  Filled 2016-07-17 (×3): qty 2

## 2016-07-17 NOTE — Progress Notes (Signed)
[facility] - [place] at [facility]   PATIENT NAME: Wendy Summers    MR#:  [id number]  DATE OF BIRTH:  08-Sep-1950  SUBJECTIVE:  CHIEF COMPLAINT:   Chief Complaint  Patient presents with  . Cough   - Breathing is improved some. Sugars are elevated due to high-dose steroids.   REVIEW OF SYSTEMS:  Review of Systems  Constitutional: Negative for chills, fever and malaise/fatigue.  HENT: Negative for ear discharge, hearing loss and nosebleeds.   Eyes: Negative for blurred vision and double vision.  Respiratory: Positive for cough and shortness of breath. Negative for wheezing.   Cardiovascular: Negative for chest pain, palpitations and leg swelling.  Gastrointestinal: Negative for abdominal pain, constipation, diarrhea, nausea and vomiting.  Genitourinary: Negative for dysuria.  Musculoskeletal: Negative for myalgias.  Neurological: Negative for dizziness, speech change, focal weakness, seizures and headaches.  Psychiatric/Behavioral: Negative for depression.    DRUG ALLERGIES:  No Known Allergies  VITALS:  Blood pressure (!) 148/67, pulse 81, temperature 97.9 F (36.6 C), temperature source Oral, resp. rate 16, height 5\' 3"  (1.6 m), weight 102.8 kg (226 lb 9.6 oz), SpO2 94 %.  PHYSICAL EXAMINATION:  Physical Exam  GENERAL:  66 y.o.-year-old patient lying in the bed with no acute distress.  EYES: Pupils equal, round, reactive to light and accommodation. No scleral icterus. Extraocular muscles intact.  HEENT: Head atraumatic, normocephalic. Oropharynx and nasopharynx clear.  NECK:  Supple, no jugular venous distention. No thyroid enlargement, no tenderness.  LUNGS:Inspiratory wheezing has resolved, still has some expiratory wheezing on deep breaths, no rales,rhonchi or crepitation. No use of accessory muscles of respiration.  CARDIOVASCULAR: S1, S2 normal. No murmurs, rubs, or gallops.  ABDOMEN: Soft, nontender, nondistended. Bowel sounds present. No  organomegaly or mass.  EXTREMITIES: No pedal edema, cyanosis, or clubbing.  NEUROLOGIC: Cranial nerves II through XII are intact. Muscle strength 5/5 in all extremities. Sensation intact. Gait not checked.  PSYCHIATRIC: The patient is alert and oriented x 3.  SKIN: No obvious rash, lesion, or ulcer.    LABORATORY PANEL:   CBC  Recent Labs Lab 07/16/16 0515  WBC 12.3*  HGB 12.5  HCT 37.1  PLT 265   ------------------------------------------------------------------------------------------------------------------  Chemistries   Recent Labs Lab 07/13/16 0115  07/17/16 0529  NA 138  < > 135  K 3.8  < > 4.8  CL 105  < > 102  CO2 23  < > 24  GLUCOSE 151*  < > 335*  BUN 33*  < > 38*  CREATININE 1.77*  < > 1.21*  CALCIUM 8.6*  < > 9.4  AST 21  --   --   ALT 17  --   --   ALKPHOS 74  --   --   BILITOT 0.4  --   --   < > = values in this interval not displayed. ------------------------------------------------------------------------------------------------------------------  Cardiac Enzymes  Recent Labs Lab 07/13/16 1903  TROPONINI <0.03   ------------------------------------------------------------------------------------------------------------------  RADIOLOGY:  Dg Chest 2 View  Result Date: 07/15/2016 CLINICAL DATA:  Shortness of breath and fever for 4 days. EXAM: CHEST  2 VIEW COMPARISON:  07/13/2016 and prior radiographs FINDINGS: Cardiomegaly and mild peribronchial thickening again noted. This is a mildly low volume film. There is no evidence of focal airspace disease, pulmonary edema, suspicious pulmonary nodule/mass, pleural effusion, or pneumothorax. No acute bony abnormalities are identified. IMPRESSION: Cardiomegaly without evidence of acute cardiopulmonary disease. Mild chronic peribronchial thickening. Electronically Signed   By: Harmon PierJeffrey  Hu  M.D.   On: 07/15/2016 15:54    EKG:   Orders placed or performed during the hospital encounter of 07/13/16  . ED  EKG within 10 minutes  . ED EKG within 10 minutes  . ED EKG  . ED EKG    ASSESSMENT AND PLAN:   66 year old female with past medical history significant for non-insulin-dependent diabetes mellitus, bronchial asthma presents to the hospital secondary to a dizzy episode followed by coughing and wheezing.  #1 syncope-vasovagal, triggered by cough. -Was monitored on telemetry, no arrhythmias. Discontinued telemetry.  #2 Asthmatic bronchitis- improving wheezing. Decreased his dose of IV steroids today. -On albuterol nebulizers. Also added Spiriva. -Adjust cough medications and monitor. -On doxycycline orally  #3 diabetes mellitus with hyperglycemia secondary to steroids. - On Lantus while in the hospital. Metformin on hold Also on Glucotrol  #4 Escherichia coli UTI-urine cultures growing Escherichia coli. On Rocephin at this time. Stop after 5 days total  #5 acute renal failure-on admission. Resolved at this time.  #6 DVT prophylaxis-on Lovenox    All the records are reviewed and case discussed with Care Management/Social Workerr. Management plans discussed with the patient, family and they are in agreement.  CODE STATUS: Full code  TOTAL TIME TAKING CARE OF THIS PATIENT: 36 minutes.   POSSIBLE D/C IN 1- 2 DAYS, DEPENDING ON CLINICAL CONDITION.   Enid Baas M.D on 07/17/2016 at 9:33 AM  Between 7am to 6pm - Pager - [phone]  After 6pm go to [email or web address] - Social research officer, government  [facility]  Office  [phone]  CC: Primary care physician; [employer], [employer]

## 2016-07-17 NOTE — Progress Notes (Signed)
Per Dr. Sheryle Hailiamond its ok to check blood sugar at 0500

## 2016-07-18 LAB — CULTURE, BLOOD (ROUTINE X 2)
Culture: NO GROWTH
Culture: NO GROWTH
SPECIMEN DESCRIPTION: ADEQUATE
SPECIMEN DESCRIPTION: ADEQUATE

## 2016-07-18 LAB — GLUCOSE, CAPILLARY
GLUCOSE-CAPILLARY: 385 mg/dL — AB (ref 65–99)
Glucose-Capillary: 390 mg/dL — ABNORMAL HIGH (ref 65–99)
Glucose-Capillary: 260 mg/dL — ABNORMAL HIGH (ref 65–99)

## 2016-07-18 MED ORDER — BENZONATATE 200 MG PO CAPS: 200.0000 mg | ORAL_CAPSULE | Freq: Three times a day (TID) | ORAL | 0 refills | Status: DC

## 2016-07-18 MED ORDER — PREDNISONE 10 MG (21) PO TBPK: ORAL_TABLET | ORAL | 0 refills | Status: DC

## 2016-07-18 MED ORDER — ALBUTEROL SULFATE (2.5 MG/3ML) 0.083% IN NEBU: 2.5000 mg | INHALATION_SOLUTION | RESPIRATORY_TRACT | 12 refills | Status: DC

## 2016-07-18 MED ORDER — DOXYCYCLINE HYCLATE 100 MG PO TABS: 100.0000 mg | ORAL_TABLET | Freq: Two times a day (BID) | ORAL | 0 refills | Status: DC

## 2016-07-18 MED ORDER — FLUTICASONE-SALMETEROL 100-50 MCG/DOSE IN AEPB: 1.0000 | INHALATION_SPRAY | Freq: Two times a day (BID) | RESPIRATORY_TRACT | Status: DC

## 2016-07-18 MED ORDER — TIOTROPIUM BROMIDE MONOHYDRATE 18 MCG IN CAPS: 18.0000 ug | ORAL_CAPSULE | Freq: Every day | RESPIRATORY_TRACT | 12 refills | Status: DC

## 2016-07-18 MED ORDER — HYDROCOD POLST-CPM POLST ER 10-8 MG/5ML PO SUER: 5.0000 mL | Freq: Two times a day (BID) | ORAL | 0 refills | Status: DC

## 2016-07-18 NOTE — Progress Notes (Signed)
Discharge instructions and prescriptions given with verbalized understanding.  VSS.  Patient taken out via wheelchair to be taken home in personal vehicle by husband.

## 2016-07-18 NOTE — Discharge Summary (Signed)
[facility] - [place] at [facility]   PATIENT NAME: Wendy Summers    MR#:  [id number]  DATE OF BIRTH:  04/07/1950  DATE OF ADMISSION:  07/13/2016   ADMITTING PHYSICIAN: Ihor Austin, MD  DATE OF DISCHARGE: 07/18/2016 12:55 PM  PRIMARY CARE PHYSICIAN: Inc, [facility]   ADMISSION DIAGNOSIS:   Bronchitis [J40] Mild intermittent asthma with exacerbation [J45.21] Near syncope [R55]  DISCHARGE DIAGNOSIS:   Active Problems:   Near syncope   Asthmatic bronchitis   SECONDARY DIAGNOSIS:   Past Medical History:  Diagnosis Date  . Asthma   . Bronchitis   . Diabetes mellitus without complication [facility])     HOSPITAL COURSE:   66 year old female with past medical history significant for non-insulin-dependent diabetes mellitus, bronchial asthma presents to the hospital secondary to a dizzy episode followed by coughing and wheezing.  #1 syncope-vasovagal, triggered by cough. -Was monitored on telemetry, no arrhythmias.  #2 Asthmatic bronchitis- improved wheezing. received IV steroids in the hospital. -On nebulizers. Also added Spiriva and advair. -Adjusted cough medications  -Finished oral doxycycline course. -Recommend to avoid passive smoking. Outpatient pulmonary follow-up recommended.  #3 diabetes mellitus with hyperglycemia secondary to steroids. - Discharge on home medications with glipizide and metformin  #4 Escherichia coli UTI-urine cultures growing Escherichia coli. Received Rocephin in the hospital.  #5 acute renal failure-on admission. Resolved at this time.  Discharge today  DISCHARGE CONDITIONS:   Guarded  CONSULTS OBTAINED:   Treatment Team:  Lamar Blinks, MD  DRUG ALLERGIES:   No Known Allergies DISCHARGE MEDICATIONS:   Allergies as of 07/18/2016   No Known Allergies     Medication List    TAKE these medications   albuterol (2.5 MG/3ML) 0.083% nebulizer solution Commonly known as:  PROVENTIL Take  3 mLs (2.5 mg total) by nebulization every 4 (four) hours.   benzonatate 200 MG capsule Commonly known as:  TESSALON Take 1 capsule (200 mg total) by mouth 3 (three) times daily.   carvedilol 6.25 MG tablet Commonly known as:  COREG Take 6.25 mg by mouth 2 (two) times daily with a meal.   chlorpheniramine-HYDROcodone 10-8 MG/5ML Suer Commonly known as:  TUSSIONEX Take 5 mLs by mouth every 12 (twelve) hours.   doxycycline 100 MG tablet Commonly known as:  VIBRA-TABS Take 1 tablet (100 mg total) by mouth every 12 (twelve) hours. X 6 more days   Fluticasone-Salmeterol 100-50 MCG/DOSE Aepb Commonly known as:  ADVAIR DISKUS Inhale 1 puff into the lungs 2 (two) times daily.   glipiZIDE 10 MG tablet Commonly known as:  GLUCOTROL Take 10 mg by mouth daily before breakfast.   lisinopril-hydrochlorothiazide 20-25 MG tablet Commonly known as:  PRINZIDE,ZESTORETIC Take 1 tablet by mouth daily.   metFORMIN 1000 MG tablet Commonly known as:  GLUCOPHAGE Take 1,000 mg by mouth 2 (two) times daily with a meal.   predniSONE 10 MG (21) Tbpk tablet Commonly known as:  STERAPRED UNI-PAK 21 TAB 6 tabs PO x 1 day 5 tabs PO x 2 days 4 tabs PO x 2 days 3 tabs PO x 2 days 2 tabs PO x 2 days 1 tab PO x 2 days and stop   tiotropium 18 MCG inhalation capsule Commonly known as:  SPIRIVA Place 1 capsule (18 mcg total) into inhaler and inhale daily. Start taking on:  07/19/2016        DISCHARGE INSTRUCTIONS:   1. PCP f/u in 1-2 weeks 2. Recommend pulmonary follow up in 2-3 weeks  DIET:   Cardiac diet  ACTIVITY:   Activity as tolerated  OXYGEN:   Home Oxygen: No.  Oxygen Delivery: room air  DISCHARGE LOCATION:   home   If you experience worsening of your admission symptoms, develop shortness of breath, life threatening emergency, suicidal or homicidal thoughts you must seek medical attention immediately by calling 911 or calling your MD immediately  if symptoms less severe.  You  Must read complete instructions/literature along with all the possible adverse reactions/side effects for all the Medicines you take and that have been prescribed to you. Take any new Medicines after you have completely understood and accpet all the possible adverse reactions/side effects.   Please note  You were cared for by a hospitalist during your hospital stay. If you have any questions about your discharge medications or the care you received while you were in the hospital after you are discharged, you can call the unit and asked to speak with the hospitalist on call if the hospitalist that took care of you is not available. Once you are discharged, your primary care physician will handle any further medical issues. Please note that NO REFILLS for any discharge medications will be authorized once you are discharged, as it is imperative that you return to your primary care physician (or establish a relationship with a primary care physician if you do not have one) for your aftercare needs so that they can reassess your need for medications and monitor your lab values.    On the day of Discharge:  VITAL SIGNS:   Blood pressure (!) 164/82, pulse (!) 120, temperature 97.8 F (36.6 C), temperature source Oral, resp. rate 18, height 5\' 3"  (1.6 m), weight 103.3 kg (227 lb 12.8 oz), SpO2 97 %.  PHYSICAL EXAMINATION:    GENERAL:  66 y.o.-year-old patient lying in the bed with no acute distress.  EYES: Pupils equal, round, reactive to light and accommodation. No scleral icterus. Extraocular muscles intact.  HEENT: Head atraumatic, normocephalic. Oropharynx and nasopharynx clear.  NECK:  Supple, no jugular venous distention. No thyroid enlargement, no tenderness.  LUNGS:Inspiratory wheezing has resolved, still has some expiratory wheezing on deep breaths, no rales,rhonchi or crepitation. No use of accessory muscles of respiration.  CARDIOVASCULAR: S1, S2 normal. No murmurs, rubs, or gallops.    ABDOMEN: Soft, nontender, nondistended. Bowel sounds present. No organomegaly or mass.  EXTREMITIES: No pedal edema, cyanosis, or clubbing.  NEUROLOGIC: Cranial nerves II through XII are intact. Muscle strength 5/5 in all extremities. Sensation intact. Gait not checked.  PSYCHIATRIC: The patient is alert and oriented x 3.  SKIN: No obvious rash, lesion, or ulcer.    DATA REVIEW:   CBC  Recent Labs Lab 07/16/16 0515  WBC 12.3*  HGB 12.5  HCT 37.1  PLT 265    Chemistries   Recent Labs Lab 07/13/16 0115  07/17/16 0529  NA 138  < > 135  K 3.8  < > 4.8  CL 105  < > 102  CO2 23  < > 24  GLUCOSE 151*  < > 335*  BUN 33*  < > 38*  CREATININE 1.77*  < > 1.21*  CALCIUM 8.6*  < > 9.4  AST 21  --   --   ALT 17  --   --   ALKPHOS 74  --   --   BILITOT 0.4  --   --   < > = values in this interval not displayed.   Microbiology Results  Results  for orders placed or performed during the hospital encounter of 07/13/16  Culture, blood (routine x 2)     Status: None   Collection Time: 07/13/16  1:10 AM  Result Value Ref Range Status   Specimen Description BLOOD Blood Culture adequate volume  Final   Special Requests   Final    BOTTLES DRAWN AEROBIC AND ANAEROBIC BLOOD RIGHT HAND   Culture NO GROWTH 5 DAYS  Final   Report Status 07/18/2016 FINAL  Final  Culture, blood (routine x 2)     Status: None   Collection Time: 07/13/16  1:17 AM  Result Value Ref Range Status   Specimen Description BLOOD Blood Culture adequate volume  Final   Special Requests   Final    BOTTLES DRAWN AEROBIC AND ANAEROBIC BLOOD LEFT HAND   Culture NO GROWTH 5 DAYS  Final   Report Status 07/18/2016 FINAL  Final  Urine culture     Status: Abnormal   Collection Time: 07/13/16  3:15 AM  Result Value Ref Range Status   Specimen Description URINE, RANDOM  Final   Special Requests NONE  Final   Culture >=100,000 COLONIES/mL ESCHERICHIA COLI (A)  Final   Report Status 07/15/2016 FINAL  Final   Organism  ID, Bacteria ESCHERICHIA COLI (A)  Final      Susceptibility   Escherichia coli - MIC*    AMPICILLIN 8 SENSITIVE Sensitive     CEFAZOLIN <=4 SENSITIVE Sensitive     CEFTRIAXONE <=1 SENSITIVE Sensitive     CIPROFLOXACIN <=0.25 SENSITIVE Sensitive     GENTAMICIN <=1 SENSITIVE Sensitive     IMIPENEM <=0.25 SENSITIVE Sensitive     NITROFURANTOIN <=16 SENSITIVE Sensitive     TRIMETH/SULFA <=20 SENSITIVE Sensitive     AMPICILLIN/SULBACTAM 4 SENSITIVE Sensitive     PIP/TAZO <=4 SENSITIVE Sensitive     Extended ESBL NEGATIVE Sensitive     * >=100,000 COLONIES/mL ESCHERICHIA COLI    RADIOLOGY:  No results found.   Management plans discussed with the patient, family and they are in agreement.  CODE STATUS:     Code Status Orders        Start     Ordered   07/13/16 [phone]  Full code  Continuous     07/13/16 0651    Code Status History    Date Active Date Inactive Code Status Order ID Comments User Context   This patient has a current code status but no historical code status.      TOTAL TIME TAKING CARE OF THIS PATIENT: 38 minutes.    Enid Baas M.D on 07/18/2016 at 2:12 PM  Between 7am to 6pm - Pager - [phone]  After 6pm go to [email or web address] - Scientist, research (life sciences) [facility]  Office  [phone]  CC: Primary care physician; Inc, [employer]   Note: This dictation was prepared with Nurse, children's dictation along with smaller Lobbyist. Any transcriptional errors that result from this process are unintentional.

## 2016-07-18 NOTE — Care Management (Signed)
patient discharge.  No discharge needs identified by members of care team

## 2016-07-18 NOTE — Progress Notes (Signed)
Dr. Sheryle Hailiamond said not to take 5 A.M. CBG.

## 2016-07-19 ENCOUNTER — Telehealth: Payer: Self-pay | Admitting: *Deleted

## 2016-07-19 NOTE — Telephone Encounter (Signed)
[facility] has requested clarity on the script for prednisone  Contact [phone] * not a patient with [facility]

## 2016-09-16 ENCOUNTER — Emergency Department
Admission: EM | Admit: 2016-09-16 | Discharge: 2016-09-16 | Disposition: A | Discharge status: Home / Self Care | Payer: Medicaid Other | Attending: Emergency Medicine | Admitting: Emergency Medicine

## 2016-09-16 ENCOUNTER — Emergency Department: Payer: Medicaid Other

## 2016-09-16 DIAGNOSIS — N189 Chronic kidney disease, unspecified: Secondary | ICD-10-CM | POA: Diagnosis not present

## 2016-09-16 DIAGNOSIS — J45909 Unspecified asthma, uncomplicated: Secondary | ICD-10-CM | POA: Insufficient documentation

## 2016-09-16 DIAGNOSIS — E860 Dehydration: Secondary | ICD-10-CM

## 2016-09-16 DIAGNOSIS — R51 Headache: Secondary | ICD-10-CM | POA: Insufficient documentation

## 2016-09-16 DIAGNOSIS — R197 Diarrhea, unspecified: Secondary | ICD-10-CM | POA: Insufficient documentation

## 2016-09-16 DIAGNOSIS — N390 Urinary tract infection, site not specified: Secondary | ICD-10-CM

## 2016-09-16 DIAGNOSIS — E119 Type 2 diabetes mellitus without complications: Secondary | ICD-10-CM | POA: Insufficient documentation

## 2016-09-16 DIAGNOSIS — J329 Chronic sinusitis, unspecified: Secondary | ICD-10-CM

## 2016-09-16 LAB — URINALYSIS, COMPLETE (UACMP) WITH MICROSCOPIC
Bacteria, UA: NONE SEEN
Bilirubin Urine: NEGATIVE
Glucose, UA: NEGATIVE mg/dL
KETONES UR: NEGATIVE mg/dL
NITRITE: NEGATIVE
PH: 5 (ref 5.0–8.0)
Protein, ur: NEGATIVE mg/dL
Specific Gravity, Urine: 1.017 (ref 1.005–1.030)

## 2016-09-16 LAB — BASIC METABOLIC PANEL
ANION GAP: 4 — AB (ref 5–15)
BUN: 49 mg/dL — ABNORMAL HIGH (ref 6–20)
CALCIUM: 8.1 mg/dL — AB (ref 8.9–10.3)
CHLORIDE: 114 mmol/L — AB (ref 101–111)
CO2: 18 mmol/L — AB (ref 22–32)
Creatinine, Ser: 1.64 mg/dL — ABNORMAL HIGH (ref 0.44–1.00)
GFR calc non Af Amer: 32 mL/min — ABNORMAL LOW (ref >60)
GFR, EST AFRICAN AMERICAN: 37 mL/min — AB (ref >60)
GLUCOSE: 58 mg/dL — AB (ref 65–99)
POTASSIUM: 4.2 mmol/L (ref 3.5–5.1)
Sodium: 136 mmol/L (ref 135–145)

## 2016-09-16 LAB — CBC
HCT: 37.8 % (ref 35.0–47.0)
Hemoglobin: 12.8 g/dL (ref 12.0–16.0)
MCH: 31.4 pg (ref 26.0–34.0)
MCHC: 34 g/dL (ref 32.0–36.0)
MCV: 92.3 fL (ref 80.0–100.0)
PLATELETS: 248 10*3/uL (ref 150–440)
RBC: 4.09 MIL/uL (ref 3.80–5.20)
RDW: 13.2 % (ref 11.5–14.5)
WBC: 11.2 10*3/uL — AB (ref 3.6–11.0)

## 2016-09-16 LAB — COMPREHENSIVE METABOLIC PANEL
ALK PHOS: 73 U/L (ref 38–126)
ALT: 15 U/L (ref 14–54)
AST: 16 U/L (ref 15–41)
Albumin: 4.1 g/dL (ref 3.5–5.0)
Anion gap: 9 (ref 5–15)
BILIRUBIN TOTAL: 0.5 mg/dL (ref 0.3–1.2)
BUN: 57 mg/dL — AB (ref 6–20)
CALCIUM: 9.4 mg/dL (ref 8.9–10.3)
CO2: 20 mmol/L — ABNORMAL LOW (ref 22–32)
Chloride: 110 mmol/L (ref 101–111)
Creatinine, Ser: 1.96 mg/dL — ABNORMAL HIGH (ref 0.44–1.00)
GFR calc Af Amer: 30 mL/min — ABNORMAL LOW (ref >60)
GFR, EST NON AFRICAN AMERICAN: 26 mL/min — AB (ref >60)
GLUCOSE: 94 mg/dL (ref 65–99)
Potassium: 4.2 mmol/L (ref 3.5–5.1)
Sodium: 139 mmol/L (ref 135–145)
TOTAL PROTEIN: 7.2 g/dL (ref 6.5–8.1)

## 2016-09-16 LAB — LIPASE, BLOOD: Lipase: 36 U/L (ref 11–51)

## 2016-09-16 MED ORDER — LEVOFLOXACIN 750 MG PO TABS: 750.0000 mg | ORAL_TABLET | Freq: Every day | ORAL | 0 refills | Status: DC

## 2016-09-16 MED ORDER — SODIUM CHLORIDE 0.9 % IV SOLN
Freq: Once | INTRAVENOUS | Status: AC
  Administered 2016-09-16: 13:00:00 via INTRAVENOUS

## 2016-09-16 MED ORDER — PROBIOTIC DAILY PO CAPS: 2.0000 | ORAL_CAPSULE | Freq: Every morning | ORAL | 1 refills | Status: DC

## 2016-09-16 NOTE — ED Notes (Signed)
No diarrhea since pt was in ED, unable to collect sample

## 2016-09-16 NOTE — ED Triage Notes (Signed)
Pt c/o bloody diarrhea with HA , neck pain and intermittent numbness to the left hand for the past 3 weeks..Marland Kitchen

## 2016-09-16 NOTE — ED Provider Notes (Signed)
[facility] Emergency Department Provider Note       Time seen: ----------------------------------------- 12:16 PM on 09/16/2016 -----------------------------------------     I have reviewed the triage vital signs and the nursing notes.   HISTORY   Chief Complaint Diarrhea and Numbness    HPI Wendy Summers is a 66 y.o. female who presents to the ED for multiple complaints. Patient claims of bloody diarrhea intermittently for the last 3 weeks. In addition to this she has had a headache, neck pain and intermittent numbness to her left hand. Family was concerned she may have had a stroke. Pain is 9 out of 10 in the head that she describes as an ache. Nothing makes it better or worse. She has not had fevers, chills, chest pain, shortness of breath, vomiting or other complaints.   Past Medical History:  Diagnosis Date  . Asthma   . Bronchitis   . Diabetes mellitus without complication [facility](HCC)     Patient Active Problem List   Diagnosis Date Noted  . Asthmatic bronchitis 07/14/2016  . Near syncope 07/13/2016    Past Surgical History:  Procedure Laterality Date  . CHOLECYSTECTOMY      Allergies Patient has no known allergies.  Social History Social History  Substance Use Topics  . Smoking status: Never Smoker  . Smokeless tobacco: Never Used  . Alcohol use No    Review of Systems Constitutional: Negative for fever. Eyes: Negative for vision changes ENT:  Negative for congestion, sore throat Cardiovascular: Negative for chest pain. Respiratory: Negative for shortness of breath. Gastrointestinal: Negative for abdominal pain, vomiting. Positive for bloody diarrhea Genitourinary: Negative for dysuria. Musculoskeletal: Negative for back pain. Skin: Negative for rash. Neurological: Positive for headache, positive for numbness  All systems negative/normal/unremarkable except as stated in the  HPI  ____________________________________________   PHYSICAL EXAM:  VITAL SIGNS: ED Triage Vitals  Enc Vitals Group     BP 09/16/16 1156 (!) 123/51     Pulse Rate 09/16/16 1155 74     Resp 09/16/16 1155 18     Temp 09/16/16 1155 97.8 F (36.6 C)     Temp Source 09/16/16 1155 Oral     SpO2 09/16/16 1155 95 %     Weight 09/16/16 1155 221 lb (100.2 kg)     Height 09/16/16 1155 5\' 2"  (1.575 m)     Head Circumference --      Peak Flow --      Pain Score 09/16/16 1155 9     Pain Loc --      Pain Edu? --      Excl. in GC? --     Constitutional: Alert and oriented. Well appearing and in no distress. Eyes: Conjunctivae are normal. Normal extraocular movements. ENT   Head: Normocephalic and atraumatic.   Nose: No congestion/rhinnorhea.   Mouth/Throat: Mucous membranes are moist.   Neck: No stridor. Cardiovascular: Normal rate, regular rhythm. No murmurs, rubs, or gallops. Respiratory: Normal respiratory effort without tachypnea nor retractions. Breath sounds are clear and equal bilaterally. No wheezes/rales/rhonchi. Gastrointestinal: Soft and nontender. Normal bowel sounds Musculoskeletal: Nontender with normal range of motion in extremities. No lower extremity tenderness nor edema. Neurologic:  Normal speech and language. No gross focal neurologic deficits are appreciated. No sensory or motor deficit appreciated Skin:  Skin is warm, dry and intact. No rash noted. Psychiatric: Mood and affect are normal. Speech and behavior are normal.  ____________________________________________  ED COURSE:  Pertinent labs & imaging results that  were available during my care of the patient were reviewed by me and considered in my medical decision making (see chart for details). Patient presents for multiple complaints, we will assess with labs and imaging as indicated.We will attempt to obtain a stool specimen   Procedures ____________________________________________   LABS  (pertinent positives/negatives)  Labs Reviewed  COMPREHENSIVE METABOLIC PANEL - Abnormal; Notable for the following:       Result Value   CO2 20 (*)    BUN 57 (*)    Creatinine, Ser 1.96 (*)    GFR calc non Af Amer 26 (*)    GFR calc Af Amer 30 (*)    All other components within normal limits  CBC - Abnormal; Notable for the following:    WBC 11.2 (*)    All other components within normal limits  URINALYSIS, COMPLETE (UACMP) WITH MICROSCOPIC - Abnormal; Notable for the following:    Color, Urine YELLOW (*)    APPearance CLOUDY (*)    Hgb urine dipstick SMALL (*)    Leukocytes, UA LARGE (*)    Squamous Epithelial / LPF 6-30 (*)    Non Squamous Epithelial 0-5 (*)    All other components within normal limits  BASIC METABOLIC PANEL - Abnormal; Notable for the following:    Chloride 114 (*)    CO2 18 (*)    Glucose, Bld 58 (*)    BUN 49 (*)    Creatinine, Ser 1.64 (*)    Calcium 8.1 (*)    GFR calc non Af Amer 32 (*)    GFR calc Af Amer 37 (*)    Anion gap 4 (*)    All other components within normal limits  C DIFFICILE QUICK SCREEN W PCR REFLEX  GASTROINTESTINAL PANEL BY PCR, STOOL (REPLACES STOOL CULTURE)  LIPASE, BLOOD    RADIOLOGY  CT head IMPRESSION: No acute intracranial abnormality.  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  Mucous retention cysts within bilateral maxillary sinuses. Right maxillary sinusitis. ____________________________________________  FINAL ASSESSMENT AND PLAN  Diarrhea, renal insufficiency, sinusitis  Plan: Patient's labs and imaging were dictated above. Patient had presented for Diarrhea that had been intermittent for several weeks. She did appear dehydrated and her labs improved after 2 L of saline. She has been unable to produce a stool specimen here, I will refer her to GI for follow-up. She will be treated with antibiotics for sinusitis and UTI which hopefully will not make her diarrhea worse. I will place  her on probiotics.   Emily Filbert, MD   Note: This note was generated in part or whole with voice recognition software. Voice recognition is usually quite accurate but there are transcription errors that can and very often do occur. I apologize for any typographical errors that were not detected and corrected.     Emily Filbert, MD 09/16/16 [id number]

## 2016-09-16 NOTE — ED Notes (Signed)
Pt given drink 

## 2016-11-08 ENCOUNTER — Encounter: Payer: Self-pay | Admitting: Emergency Medicine

## 2016-11-08 ENCOUNTER — Emergency Department
Admission: EM | Admit: 2016-11-08 | Discharge: 2016-11-08 | Disposition: A | Discharge status: Home / Self Care | Payer: Medicaid Other | Attending: Emergency Medicine | Admitting: Emergency Medicine

## 2016-11-08 DIAGNOSIS — Z79899 Other long term (current) drug therapy: Secondary | ICD-10-CM | POA: Insufficient documentation

## 2016-11-08 DIAGNOSIS — E1165 Type 2 diabetes mellitus with hyperglycemia: Secondary | ICD-10-CM | POA: Insufficient documentation

## 2016-11-08 DIAGNOSIS — N390 Urinary tract infection, site not specified: Secondary | ICD-10-CM | POA: Diagnosis not present

## 2016-11-08 DIAGNOSIS — J45909 Unspecified asthma, uncomplicated: Secondary | ICD-10-CM | POA: Insufficient documentation

## 2016-11-08 DIAGNOSIS — R42 Dizziness and giddiness: Secondary | ICD-10-CM | POA: Insufficient documentation

## 2016-11-08 DIAGNOSIS — R739 Hyperglycemia, unspecified: Secondary | ICD-10-CM

## 2016-11-08 LAB — GLUCOSE, CAPILLARY
Glucose-Capillary: 500 mg/dL — ABNORMAL HIGH (ref 65–99)
Glucose-Capillary: 375 mg/dL — ABNORMAL HIGH (ref 65–99)

## 2016-11-08 LAB — BASIC METABOLIC PANEL
ANION GAP: 13 (ref 5–15)
BUN: 28 mg/dL — ABNORMAL HIGH (ref 6–20)
CALCIUM: 8.8 mg/dL — AB (ref 8.9–10.3)
CO2: 19 mmol/L — ABNORMAL LOW (ref 22–32)
Chloride: 101 mmol/L (ref 101–111)
Creatinine, Ser: 1.56 mg/dL — ABNORMAL HIGH (ref 0.44–1.00)
GFR, EST AFRICAN AMERICAN: 39 mL/min — AB (ref >60)
GFR, EST NON AFRICAN AMERICAN: 34 mL/min — AB (ref >60)
Glucose, Bld: 519 mg/dL (ref 65–99)
POTASSIUM: 4.2 mmol/L (ref 3.5–5.1)
Sodium: 133 mmol/L — ABNORMAL LOW (ref 135–145)

## 2016-11-08 LAB — URINALYSIS, COMPLETE (UACMP) WITH MICROSCOPIC
BILIRUBIN URINE: NEGATIVE
Bacteria, UA: NONE SEEN
HGB URINE DIPSTICK: NEGATIVE
KETONES UR: NEGATIVE mg/dL
NITRITE: NEGATIVE
PROTEIN: 100 mg/dL — AB
Specific Gravity, Urine: 1.017 (ref 1.005–1.030)
pH: 5 (ref 5.0–8.0)

## 2016-11-08 LAB — CBC
HEMATOCRIT: 44.8 % (ref 35.0–47.0)
HEMOGLOBIN: 14.7 g/dL (ref 12.0–16.0)
MCH: 31.2 pg (ref 26.0–34.0)
MCHC: 32.9 g/dL (ref 32.0–36.0)
MCV: 94.8 fL (ref 80.0–100.0)
Platelets: 263 10*3/uL (ref 150–440)
RBC: 4.72 MIL/uL (ref 3.80–5.20)
RDW: 13.2 % (ref 11.5–14.5)
WBC: 13.6 10*3/uL — ABNORMAL HIGH (ref 3.6–11.0)

## 2016-11-08 LAB — TROPONIN I: Troponin I: <0.03 ng/mL (ref <0.03)

## 2016-11-08 MED ORDER — CEPHALEXIN 500 MG PO CAPS: 500.0000 mg | ORAL_CAPSULE | Freq: Three times a day (TID) | ORAL | 0 refills | Status: AC

## 2016-11-08 MED ORDER — INSULIN ASPART 100 UNIT/ML ~~LOC~~ SOLN
8.0000 [IU] | Freq: Once | SUBCUTANEOUS | Status: AC
  Administered 2016-11-08: 8 [IU] via SUBCUTANEOUS
  Filled 2016-11-08: qty 1

## 2016-11-08 MED ORDER — SODIUM CHLORIDE 0.9 % IV BOLUS (SEPSIS)
1000.0000 mL | Freq: Once | INTRAVENOUS | Status: AC
  Administered 2016-11-08: 1000 mL via INTRAVENOUS

## 2016-11-08 MED ORDER — CEPHALEXIN 500 MG PO CAPS
500.0000 mg | ORAL_CAPSULE | Freq: Once | ORAL | Status: AC
  Administered 2016-11-08: 500 mg via ORAL
  Filled 2016-11-08: qty 1

## 2016-11-08 NOTE — ED Provider Notes (Addendum)
[facility]  ____________________________________________   I have reviewed the triage vital signs and the nursing notes.   HISTORY  Chief Complaint Dizziness    HPI Wendy Summers is a 66 y.o. female With a history of diabetes, normally only drinks Didrex but today she wanted some vague sweets that she had a CT for dinner. This is unusual for her. Afterwards she felt lightheaded. She denies a fever chills nausea vomiting chest pain shortness of breath abdominal pain dysuria or urinary frequency chest pain shortness of breath focal numbness focal weakness she denies fever she denies difficulty walking or talking, she just feels "off" after having a very large sweet tea for dinner nothing makes it better nothing makes it worse she felt totally fine before the sugar drink   Past Medical History:  Diagnosis Date  . Asthma   . Bronchitis   . Diabetes mellitus without complication [facility])     Patient Active Problem List   Diagnosis Date Noted  . Asthmatic bronchitis 07/14/2016  . Near syncope 07/13/2016    Past Surgical History:  Procedure Laterality Date  . CHOLECYSTECTOMY      Prior to Admission medications   Medication Sig Start Date End Date Taking? Authorizing Provider  albuterol (PROVENTIL) (2.5 MG/3ML) 0.083% nebulizer solution Take 3 mLs (2.5 mg total) by nebulization every 4 (four) hours. 07/18/16  Yes Enid Baas, MD  carvedilol (COREG) 6.25 MG tablet Take 6.25 mg by mouth 2 (two) times daily with a meal.   Yes [provider]  Fluticasone-Salmeterol (ADVAIR DISKUS) 100-50 MCG/DOSE AEPB Inhale 1 puff into the lungs 2 (two) times daily. 07/18/16 07/18/17 Yes Enid Baas, MD  glipiZIDE (GLUCOTROL) 10 MG tablet Take 10 mg by mouth daily before breakfast.   Yes [provider]  lisinopril-hydrochlorothiazide (PRINZIDE,ZESTORETIC) 20-25 MG tablet Take 1 tablet by mouth daily.    Yes [provider]  metFORMIN (GLUCOPHAGE) 1000 MG tablet Take 1,000 mg by mouth 2 (two) times daily with a meal.   Yes [provider]  tiotropium (SPIRIVA) 18 MCG inhalation capsule Place 1 capsule (18 mcg total) into inhaler and inhale daily. 07/19/16  Yes Enid Baas, MD  benzonatate (TESSALON) 200 MG capsule Take 1 capsule (200 mg total) by mouth 3 (three) times daily. Patient not taking: Reported on 11/08/2016 07/18/16   Enid Baas, MD  chlorpheniramine-HYDROcodone (TUSSIONEX) 10-8 MG/5ML SUER Take 5 mLs by mouth every 12 (twelve) hours. Patient not taking: Reported on 11/08/2016 07/18/16   Enid Baas, MD  predniSONE (STERAPRED UNI-PAK 21 TAB) 10 MG (21) TBPK tablet 6 tabs PO x 1 day 5 tabs PO x 2 days 4 tabs PO x 2 days 3 tabs PO x 2 days 2 tabs PO x 2 days 1 tab PO x 2 days and stop Patient not taking: Reported on 11/08/2016 07/18/16   Enid Baas, MD  Probiotic Product (PROBIOTIC DAILY) CAPS Take 2 capsules by mouth every morning. Patient not taking: Reported on 11/08/2016 09/16/16   Emily Filbert, MD    Allergies Patient has no known allergies.  No family history on file.  Social History Social History  Substance Use Topics  . Smoking status: Never Smoker  . Smokeless tobacco: Never Used  . Alcohol use No    Review of Systems Constitutional: No fever/chills Eyes: No visual changes. ENT: No sore throat. No stiff neck no neck pain Cardiovascular: Denies chest pain. Respiratory: Denies shortness of breath. Gastrointestinal:   no  vomiting.  No diarrhea.  No constipation. Genitourinary: Negative for dysuria. Musculoskeletal: Negative lower extremity swelling Skin: Negative for rash. Neurological: Negative for severe headaches, focal weakness or numbness.   ____________________________________________   PHYSICAL EXAM:  VITAL SIGNS: ED Triage Vitals  Enc Vitals Group     BP 11/08/16 2039 103/63     Pulse Rate  11/08/16 2039 (!) 105     Resp 11/08/16 2039 18     Temp 11/08/16 2039 97.9 F (36.6 C)     Temp Source 11/08/16 2039 Oral     SpO2 11/08/16 2039 97 %     Weight 11/08/16 1927 220 lb (99.8 kg)     Height 11/08/16 1927 5\' 3"  (1.6 m)     Head Circumference --      Peak Flow --      Pain Score --      Pain Loc --      Pain Edu? --      Excl. in GC? --     Constitutional: Alert and oriented. Well appearing and in no acute distress. Eyes: Conjunctivae are normal Head: Atraumatic HEENT: No congestion/rhinnorhea. Mucous membranes are moist.  Oropharynx non-erythematous Neck:   Nontender with no meningismus, no masses, no stridor Cardiovascular: Normal rate, regular rhythm. Grossly normal heart sounds.  Good peripheral circulation. Respiratory: Normal respiratory effort.  No retractions. Lungs CTAB. Abdominal: Soft and nontender. No distention. No guarding no rebound Back:  There is no focal tenderness or step off.  there is no midline tenderness there are no lesions noted. there is no CVA tenderness  Musculoskeletal: No lower extremity tenderness, no upper extremity tenderness. No joint effusions, no DVT signs strong distal pulses no edema Neurologic:  Normal speech and language. No gross focal neurologic deficits are appreciated.  Skin:  Skin is warm, dry and intact. No rash noted. Psychiatric: Mood and affect are normal. Speech and behavior are normal.  ____________________________________________   LABS (all labs ordered are listed, but only abnormal results are displayed)  Labs Reviewed  BASIC METABOLIC PANEL - Abnormal; Notable for the following:       Result Value   Sodium 133 (*)    CO2 19 (*)    Glucose, Bld 519 (*)    BUN 28 (*)    Creatinine, Ser 1.56 (*)    Calcium 8.8 (*)    GFR calc non Af Amer 34 (*)    GFR calc Af Amer 39 (*)    All other components within normal limits  CBC - Abnormal; Notable for the following:    WBC 13.6 (*)    All other components within  normal limits  URINALYSIS, COMPLETE (UACMP) WITH MICROSCOPIC - Abnormal; Notable for the following:    Color, Urine AMBER (*)    APPearance CLOUDY (*)    Glucose, UA >=500 (*)    Protein, ur 100 (*)    Leukocytes, UA LARGE (*)    Squamous Epithelial / LPF 6-30 (*)    All other components within normal limits  GLUCOSE, CAPILLARY - Abnormal; Notable for the following:    Glucose-Capillary 500 (*)    All other components within normal limits  URINE CULTURE  TROPONIN I  CBG MONITORING, ED   ____________________________________________  EKG  I personally interpreted any EKGs ordered by me or triage sinus rhythm rate in 90s from minute no acute ST elevation or depression normal axis, nonspecific ST changes unremarkable EKG ____________________________________________  RADIOLOGY  I reviewed any imaging ordered by me  or triage that were performed during my shift and, if possible, patient and/or family made aware of any abnormal findings. ____________________________________________   PROCEDURES  Procedure(s) performed: None  Procedures  Critical Care performed: None  ____________________________________________   INITIAL IMPRESSION / ASSESSMENT AND PLAN / ED COURSE  Pertinent labs & imaging results that were available during my care of the patient were reviewed by me and considered in my medical decision making (see chart for details).  patient felt weak and woozy after drinking a very high sugar beverage. Very poorly controlled diabetic at baseline it appears from prior visits and now her sugars over 500 likely secondary to that ingestion. However she does not appear to be in DKA nor does she appear to be toxic. She does have what appears to be a likely urinary tract infection. We'll send a urine culture we'll treat her with Keflex. We'll give her insulin and fluid and bring down her sugar and it is my hope he can safely get her home after that. Nothing to suggest CVA, nothing  to suggest ACS PE dissection or sepsis etc.  ----------------------------------------- 10:39 PM on 11/08/2016 -----------------------------------------  Patient sugar trending down and creatinine at baseline, extensive counseling given understood, patient feels 100% better, she is not orthostatic, we'll treat her for her UTIhe and she will return to the emergency room for new or worrisome symptoms    ____________________________________________   FINAL CLINICAL IMPRESSION(S) / ED DIAGNOSES  Final diagnoses:  None      This chart was dictated using voice recognition software.  Despite best efforts to proofread,  errors can occur which can change meaning.      Jeanmarie Plant, MD 11/08/16 2109    Jeanmarie Plant, MD 11/08/16 2239

## 2016-11-08 NOTE — ED Triage Notes (Addendum)
Patient to ER for c/o dizziness that began at approx 1500-1530 today. Denies any unilateral weakness, slurred speech, headache, or any other pain. +Nausea.

## 2016-11-08 NOTE — Discharge Instructions (Signed)
if you have chest pain, shortness of breath or you feel worse in any way please return to the emergency department

## 2016-11-11 LAB — URINE CULTURE: Culture: >=100000 — AB

## 2016-12-13 ENCOUNTER — Emergency Department: Payer: Medicaid Other

## 2016-12-13 ENCOUNTER — Encounter: Payer: Self-pay | Admitting: Emergency Medicine

## 2016-12-13 ENCOUNTER — Emergency Department
Admission: EM | Admit: 2016-12-13 | Discharge: 2016-12-13 | Disposition: A | Discharge status: Home / Self Care | Payer: Medicaid Other | Attending: Emergency Medicine | Admitting: Emergency Medicine

## 2016-12-13 DIAGNOSIS — Z23 Encounter for immunization: Secondary | ICD-10-CM | POA: Diagnosis not present

## 2016-12-13 DIAGNOSIS — R55 Syncope and collapse: Secondary | ICD-10-CM

## 2016-12-13 DIAGNOSIS — Z79899 Other long term (current) drug therapy: Secondary | ICD-10-CM | POA: Insufficient documentation

## 2016-12-13 DIAGNOSIS — S0181XA Laceration without foreign body of other part of head, initial encounter: Secondary | ICD-10-CM

## 2016-12-13 DIAGNOSIS — J45909 Unspecified asthma, uncomplicated: Secondary | ICD-10-CM | POA: Diagnosis not present

## 2016-12-13 DIAGNOSIS — Y999 Unspecified external cause status: Secondary | ICD-10-CM | POA: Diagnosis not present

## 2016-12-13 DIAGNOSIS — Y9289 Other specified places as the place of occurrence of the external cause: Secondary | ICD-10-CM | POA: Insufficient documentation

## 2016-12-13 DIAGNOSIS — W01198A Fall on same level from slipping, tripping and stumbling with subsequent striking against other object, initial encounter: Secondary | ICD-10-CM | POA: Insufficient documentation

## 2016-12-13 DIAGNOSIS — S0990XA Unspecified injury of head, initial encounter: Secondary | ICD-10-CM | POA: Diagnosis present

## 2016-12-13 DIAGNOSIS — Y9389 Activity, other specified: Secondary | ICD-10-CM | POA: Insufficient documentation

## 2016-12-13 LAB — CBC
HEMATOCRIT: 43.4 % (ref 35.0–47.0)
Hemoglobin: 14.6 g/dL (ref 12.0–16.0)
MCH: 30.9 pg (ref 26.0–34.0)
MCHC: 33.7 g/dL (ref 32.0–36.0)
MCV: 91.9 fL (ref 80.0–100.0)
PLATELETS: 230 10*3/uL (ref 150–440)
RBC: 4.72 MIL/uL (ref 3.80–5.20)
RDW: 12.9 % (ref 11.5–14.5)
WBC: 14.1 10*3/uL — AB (ref 3.6–11.0)

## 2016-12-13 LAB — BASIC METABOLIC PANEL
Anion gap: 12 (ref 5–15)
BUN: 24 mg/dL — AB (ref 6–20)
CO2: 22 mmol/L (ref 22–32)
Calcium: 9 mg/dL (ref 8.9–10.3)
Chloride: 108 mmol/L (ref 101–111)
Creatinine, Ser: 1.22 mg/dL — ABNORMAL HIGH (ref 0.44–1.00)
GFR, EST AFRICAN AMERICAN: 53 mL/min — AB (ref >60)
GFR, EST NON AFRICAN AMERICAN: 46 mL/min — AB (ref >60)
Glucose, Bld: 181 mg/dL — ABNORMAL HIGH (ref 65–99)
POTASSIUM: 4.9 mmol/L (ref 3.5–5.1)
SODIUM: 142 mmol/L (ref 135–145)

## 2016-12-13 MED ORDER — BACITRACIN ZINC 500 UNIT/GM EX OINT
TOPICAL_OINTMENT | Freq: Every day | CUTANEOUS | Status: DC
  Administered 2016-12-13: 1 via TOPICAL

## 2016-12-13 MED ORDER — LIDOCAINE HCL (PF) 1 % IJ SOLN
INTRAMUSCULAR | Status: AC
  Administered 2016-12-13: 19:00:00
  Filled 2016-12-13: qty 5

## 2016-12-13 MED ORDER — BACITRACIN ZINC 500 UNIT/GM EX OINT
TOPICAL_OINTMENT | CUTANEOUS | Status: AC
  Filled 2016-12-13: qty 0.9

## 2016-12-13 MED ORDER — TETANUS-DIPHTH-ACELL PERTUSSIS 5-2.5-18.5 LF-MCG/0.5 IM SUSP
0.5000 mL | Freq: Once | INTRAMUSCULAR | Status: AC
  Administered 2016-12-13: 0.5 mL via INTRAMUSCULAR
  Filled 2016-12-13: qty 0.5

## 2016-12-13 NOTE — ED Notes (Signed)
Patient placed in a gown. Pericare performed.

## 2016-12-13 NOTE — Discharge Instructions (Signed)
Return to the ER immediately for new or worsening lightheadedness or dizziness, weakness, headache, vomiting, confusion, or any other new or worsening symptoms that concern you.   You should return to the ER in 5 days to have the stitches taken out.

## 2016-12-13 NOTE — ED Notes (Signed)
Patient transported to CT 

## 2016-12-13 NOTE — ED Triage Notes (Signed)
Patient presents to ED via [facility] after donating plasma. Patient walked outside and had a syncopal episode. Head trauma noted. Laceration to right forehead. Right side of eye glasses cracked. Patient c/o headache. Patient lost control of bladder. Requires sternal rub upon arrival.

## 2016-12-13 NOTE — ED Provider Notes (Signed)
[facility] Emergency Department Provider Note ____________________________________________   First MD Initiated Contact with Patient 12/13/16 1613     (approximate)  I have reviewed the triage vital signs and the nursing notes.   HISTORY  Chief Complaint Loss of Consciousness    HPI Wendy Summers is a 66 y.o. female who presents with syncope, acute onset after she donated plasma and then walked outside, preceded by feeling lightheaded and nauseous, and associated with a resulting head injury when she passed out. Patient also reports some neck pain. She denies any other acute symptoms. She denies chest pain or difficulty breathing.  Past Medical History:  Diagnosis Date  . Asthma   . Bronchitis   . Diabetes mellitus without complication [facility])     Patient Active Problem List   Diagnosis Date Noted  . Asthmatic bronchitis 07/14/2016  . Near syncope 07/13/2016    Past Surgical History:  Procedure Laterality Date  . CHOLECYSTECTOMY      Prior to Admission medications   Medication Sig Start Date End Date Taking? Authorizing Provider  albuterol (PROVENTIL) (2.5 MG/3ML) 0.083% nebulizer solution Take 3 mLs (2.5 mg total) by nebulization every 4 (four) hours. 07/18/16   Enid Baas, MD  benzonatate (TESSALON) 200 MG capsule Take 1 capsule (200 mg total) by mouth 3 (three) times daily. Patient not taking: Reported on 11/08/2016 07/18/16   Enid Baas, MD  carvedilol (COREG) 6.25 MG tablet Take 6.25 mg by mouth 2 (two) times daily with a meal.    [provider]  chlorpheniramine-HYDROcodone (TUSSIONEX) 10-8 MG/5ML SUER Take 5 mLs by mouth every 12 (twelve) hours. Patient not taking: Reported on 11/08/2016 07/18/16   Enid Baas, MD  Fluticasone-Salmeterol (ADVAIR DISKUS) 100-50 MCG/DOSE AEPB Inhale 1 puff into the lungs 2 (two) times daily. 07/18/16 07/18/17  Enid Baas, MD  glipiZIDE (GLUCOTROL) 10 MG tablet Take 10 mg by  mouth daily before breakfast.    [provider]  lisinopril-hydrochlorothiazide (PRINZIDE,ZESTORETIC) 20-25 MG tablet Take 1 tablet by mouth daily.    [provider]  metFORMIN (GLUCOPHAGE) 1000 MG tablet Take 1,000 mg by mouth 2 (two) times daily with a meal.    [provider]  predniSONE (STERAPRED UNI-PAK 21 TAB) 10 MG (21) TBPK tablet 6 tabs PO x 1 day 5 tabs PO x 2 days 4 tabs PO x 2 days 3 tabs PO x 2 days 2 tabs PO x 2 days 1 tab PO x 2 days and stop Patient not taking: Reported on 11/08/2016 07/18/16   Enid Baas, MD  Probiotic Product (PROBIOTIC DAILY) CAPS Take 2 capsules by mouth every morning. Patient not taking: Reported on 11/08/2016 09/16/16   Emily Filbert, MD  tiotropium [facility]) 18 MCG inhalation capsule Place 1 capsule (18 mcg total) into inhaler and inhale daily. 07/19/16   Enid Baas, MD    Allergies Patient has no known allergies.  No family history on file.  Social History Social History  Substance Use Topics  . Smoking status: Never Smoker  . Smokeless tobacco: Never Used  . Alcohol use No    Review of Systems  Constitutional: No fever. Eyes: No visual changes. ENT: Positive for bilateral lower neck pain. Cardiovascular: Denies chest pain. Respiratory: Denies shortness of breath. Gastrointestinal: No vomiting or abdominal pain.  Genitourinary: Negative for flank pain.  Musculoskeletal: Negative for back pain. Skin: Positive for forehead laceration. Neurological: Positive for headache.    ____________________________________________   PHYSICAL EXAM:  VITAL SIGNS: ED  Triage Vitals  Enc Vitals Group     BP 12/13/16 1553 (!) 110/58     Pulse Rate 12/13/16 1553 70     Resp 12/13/16 1553 17     Temp 12/13/16 1553 (!) 97.5 F (36.4 C)     Temp Source 12/13/16 1553 Oral     SpO2 12/13/16 1553 98 %     Weight 12/13/16 1553 220 lb (99.8 kg)     Height 12/13/16 1553  (1.575 m)     Head  Circumference --      Peak Flow --      Pain Score 12/13/16 1552 10     Pain Loc --      Pain Edu? --      Excl. in GC? --     Constitutional: Alert and oriented. Well appearing and in no acute distress. Eyes: Conjunctivae are normal.  EOMI.  PERRLA.  Head: 3cm L shaped laceration to R forehead.  Nose: No congestion/rhinnorhea. Mouth/Throat: Mucous membranes are moist.   Neck: Normal range of motion.  Mild bilat paraspinal and midline lower cspine tenderness.  Cardiovascular: Normal rate, regular rhythm. Grossly normal heart sounds.  Good peripheral circulation. Respiratory: Normal respiratory effort.  No retractions. Lungs CTAB. Gastrointestinal: Soft and nontender. No distention.  Genitourinary: No CVA tenderness. Musculoskeletal: No lower extremity edema.  Extremities warm and well perfused.  Neurologic:  Normal speech and language. No gross focal neurologic deficits are appreciated. Motor and sensory intact in all extremities.  Normal coordination.  Skin:  Skin is warm and dry. No rash noted. Psychiatric: Mood and affect are normal. Speech and behavior are normal.  ____________________________________________   LABS (all labs ordered are listed, but only abnormal results are displayed)  Labs Reviewed  BASIC METABOLIC PANEL - Abnormal; Notable for the following:       Result Value   Glucose, Bld 181 (*)    BUN 24 (*)    Creatinine, Ser 1.22 (*)    GFR calc non Af Amer 46 (*)    GFR calc Af Amer 53 (*)    All other components within normal limits  CBC - Abnormal; Notable for the following:    WBC 14.1 (*)    All other components within normal limits  URINALYSIS, COMPLETE (UACMP) WITH MICROSCOPIC  CBG MONITORING, ED   ____________________________________________  EKG  ED ECG REPORT I, Dionne Bucy, the attending physician, personally viewed and interpreted this ECG.  Date: 12/13/2016 EKG Time: 1549 Rate: 73 Rhythm: normal sinus rhythm QRS Axis:  normal Intervals: normal ST/T Wave abnormalities: normal Narrative Interpretation: no evidence of acute ischemia  ____________________________________________  RADIOLOGY  CT head: no intracranial hemorrhage  CT C-spine: no acute fracture or traumatic findings  ____________________________________________   PROCEDURES  Procedure(s) performed: Yes  LACERATION REPAIR Performed by: Dionne Bucy Authorized by: Dionne Bucy Consent: Verbal consent obtained. Risks and benefits: risks, benefits and alternatives were discussed Consent given by: patient Patient identity confirmed: provided demographic data Prepped and Draped in normal sterile fashion Wound explored  Laceration Location: R forehead  Laceration Length: 3cm, stellate  No Foreign Bodies seen or palpated  Anesthesia: local infiltration  Local anesthetic: lidocaine 1% w/o epinephrine  Anesthetic total: 3 ml  Irrigation method: syringe Amount of cleaning: standard  Skin closure:  5-0 nylon  Number of sutures: 6  Technique: Simple interrupted  Patient tolerance: Patient tolerated the procedure well with no immediate complications.    Critical Care performed: No ____________________________________________   INITIAL IMPRESSION /  ASSESSMENT AND PLAN / ED COURSE  Pertinent labs & imaging results that were available during my care of the patient were reviewed by me and considered in my medical decision making (see chart for details).  66 year old female with past medical history as noted presents with syncope after donating plasma. There was a prodrome of lightheadedness and nausea. Patient fell and hit her forehead, resulting in a laceration. Presentation is consistent with vasovagal syncope after donating the plasma; there is no evidence of cardiac or CNS cause. Given the patient's age and the head trauma, as well as the neck pain we'll obtain CT head and C-spine. Plan for basic labs, tetanus,  laceration repair, and likely discharge home if negative imaging and workup.    ----------------------------------------- 8:28 PM on 12/13/2016 -----------------------------------------  CT head and C-spine are negative. no significant tenderness on repeat exam.  Lab workup is unremarkable except for slightly elevated white blood cell count which is nonspecific; patient has no clinical signs to suggest infection. There is no indication to perform a UA as patient has no urinary symptoms and the syncope was precipitated likely by the plasma donation.  Patient states she is feeling better and feels well to go home.  Laceration repaired.    ____________________________________________   FINAL CLINICAL IMPRESSION(S) / ED DIAGNOSES  Final diagnoses:  Syncope, unspecified syncope type  Laceration of forehead, initial encounter      NEW MEDICATIONS STARTED DURING THIS VISIT:  New Prescriptions   No medications on file     Note:  This document was prepared using Dragon voice recognition software and may include unintentional dictation errors.    Dionne Bucy, MD 12/13/16 2030

## 2016-12-16 ENCOUNTER — Emergency Department: Payer: Medicaid Other

## 2016-12-16 ENCOUNTER — Emergency Department
Admission: EM | Admit: 2016-12-16 | Discharge: 2016-12-16 | Disposition: A | Discharge status: Home / Self Care | Payer: Medicaid Other | Attending: Emergency Medicine | Admitting: Emergency Medicine

## 2016-12-16 ENCOUNTER — Encounter: Payer: Self-pay | Admitting: Emergency Medicine

## 2016-12-16 DIAGNOSIS — S29001A Unspecified injury of muscle and tendon of front wall of thorax, initial encounter: Secondary | ICD-10-CM | POA: Diagnosis present

## 2016-12-16 DIAGNOSIS — E119 Type 2 diabetes mellitus without complications: Secondary | ICD-10-CM | POA: Insufficient documentation

## 2016-12-16 DIAGNOSIS — Y929 Unspecified place or not applicable: Secondary | ICD-10-CM | POA: Diagnosis not present

## 2016-12-16 DIAGNOSIS — Y939 Activity, unspecified: Secondary | ICD-10-CM | POA: Diagnosis not present

## 2016-12-16 DIAGNOSIS — S2231XA Fracture of one rib, right side, initial encounter for closed fracture: Secondary | ICD-10-CM | POA: Diagnosis not present

## 2016-12-16 DIAGNOSIS — Y999 Unspecified external cause status: Secondary | ICD-10-CM | POA: Insufficient documentation

## 2016-12-16 DIAGNOSIS — J45909 Unspecified asthma, uncomplicated: Secondary | ICD-10-CM | POA: Diagnosis not present

## 2016-12-16 DIAGNOSIS — W19XXXD Unspecified fall, subsequent encounter: Secondary | ICD-10-CM | POA: Diagnosis not present

## 2016-12-16 MED ORDER — OXYCODONE-ACETAMINOPHEN 5-325 MG PO TABS: 1.0000 | ORAL_TABLET | Freq: Three times a day (TID) | ORAL | 0 refills | Status: DC | PRN

## 2016-12-16 MED ORDER — OXYCODONE-ACETAMINOPHEN 5-325 MG PO TABS
ORAL_TABLET | ORAL | Status: AC
  Administered 2016-12-16: 2 via ORAL
  Filled 2016-12-16: qty 2

## 2016-12-16 MED ORDER — OXYCODONE-ACETAMINOPHEN 5-325 MG PO TABS
2.0000 | ORAL_TABLET | Freq: Once | ORAL | Status: AC
  Administered 2016-12-16: 2 via ORAL

## 2016-12-16 NOTE — ED Triage Notes (Signed)
Pt was seen here for a fall on Wednesday injuring her right side rib area. Pt c/o increase pain and worse when taking a deep breath.

## 2016-12-16 NOTE — ED Notes (Signed)
Pt fell on Tuesday and having right side rib, hurts worse when she takes a deep breath in.

## 2016-12-16 NOTE — ED Provider Notes (Signed)
[facility] Emergency Department Provider Note       Time seen: ----------------------------------------- 3:09 PM on 12/16/2016 -----------------------------------------     I have reviewed the triage vital signs and the nursing notes.   HISTORY   Chief Complaint Rib Injury    HPI Wendy Summers is a 66 y.o. female with a history of asthma, bronchitis and diabetes who presents to the ED for a fall with right-sided rib pain. patient states she was seen here for a fall on Wednesday and she injured her right rib area. She complains of increased pain that is worse when she takes deep breath on the right side. She does not think that this was x-rayed during her previous visit. pain is 10 out of 10 in the right chest.  Past Medical History:  Diagnosis Date  . Asthma   . Bronchitis   . Diabetes mellitus without complication [facility])     Patient Active Problem List   Diagnosis Date Noted  . Asthmatic bronchitis 07/14/2016  . Near syncope 07/13/2016    Past Surgical History:  Procedure Laterality Date  . CHOLECYSTECTOMY      Allergies Patient has no known allergies.  Social History Social History  Substance Use Topics  . Smoking status: Never Smoker  . Smokeless tobacco: Never Used  . Alcohol use No    Review of Systems Constitutional: Negative for fever. Cardiovascular: positive for chest pain Respiratory: Negative for shortness of breath. Gastrointestinal: Negative for abdominal pain, vomiting and diarrhea. Genitourinary: Negative for dysuria. Musculoskeletal: Negative for back pain. Skin: Negative for rash. Neurological: Negative for headaches, focal weakness or numbness.  All systems negative/normal/unremarkable except as stated in the HPI  ____________________________________________   PHYSICAL EXAM:  VITAL SIGNS: ED Triage Vitals  Enc Vitals Group     BP 12/16/16 1434 (!) 144/61     Pulse Rate 12/16/16 1434 95     Resp  12/16/16 1434 20     Temp 12/16/16 1434 98.5 F (36.9 C)     Temp Source 12/16/16 1434 Oral     SpO2 12/16/16 1434 96 %     Weight 12/16/16 1434 220 lb (99.8 kg)     Height --      Head Circumference --      Peak Flow --      Pain Score 12/16/16 1433 10     Pain Loc --      Pain Edu? --      Excl. in GC? --     Constitutional: Alert and oriented. no distress Eyes: Conjunctivae are normal. Normal extraocular movements. ENT   Head: Normocephalic, right periorbital ecchymosis   Nose: No congestion/rhinnorhea.   Mouth/Throat: Mucous membranes are moist.   Neck: No stridor. Cardiovascular: Normal rate, regular rhythm. No murmurs, rubs, or gallops. Respiratory: Normal respiratory effort without tachypnea nor retractions. Breath sounds are clear and equal bilaterally. No wheezes/rales/rhonchi. Gastrointestinal: Soft and nontender. Normal bowel sounds Musculoskeletal: Nontender with normal range of motion in extremities. No lower extremity tenderness nor edema.specific right chest wall tenderness and right axillary rib tenderness Neurologic:  Normal speech and language. No gross focal neurologic deficits are appreciated.  Skin:  scattered contusions are noted Psychiatric: Mood and affect are normal. Speech and behavior are normal.  ____________________________________________  ED COURSE:  Pertinent labs & imaging results that were available during my care of the patient were reviewed by me and considered in my medical decision making (see chart for details). Patient presents for right sided  chest pain status post fall, we will assess with imaging as indicated.   Procedures ____________________________________________   RADIOLOGY  rib x-rays IMPRESSION: Mild cardiomegaly without focal airspace disease. No rib fracture. ____________________________________________  DIFFERENTIAL DIAGNOSIS   rib contusion, rib fracture, pneumothorax, PE   FINAL ASSESSMENT AND  PLAN  rib fracture   Plan: Patient had presented for right-sided rib and chest wall pain after recent fall. Patients imaging was reassuring but due to the tenderness that she has some examination suspect small underlying rib fracture. She'll be discharged with pain medicine and is clear for outpatient follow-up with her doctor.   Emily FilbertWilliams, Jonathan E, MD   Note: This note was generated in part or whole with voice recognition software. Voice recognition is usually quite accurate but there are transcription errors that can and very often do occur. I apologize for any typographical errors that were not detected and corrected.     Emily FilbertWilliams, Jonathan E, MD 12/16/16 [phone]

## 2016-12-16 NOTE — ED Notes (Signed)
Pt in X ray

## 2016-12-16 NOTE — ED Notes (Signed)
Patient transported to X-ray 

## 2016-12-20 ENCOUNTER — Encounter: Payer: Self-pay | Admitting: *Deleted

## 2016-12-20 ENCOUNTER — Emergency Department
Admission: EM | Admit: 2016-12-20 | Discharge: 2016-12-20 | Disposition: A | Discharge status: Home / Self Care | Payer: Medicaid Other | Attending: Emergency Medicine | Admitting: Emergency Medicine

## 2016-12-20 DIAGNOSIS — J45909 Unspecified asthma, uncomplicated: Secondary | ICD-10-CM | POA: Diagnosis not present

## 2016-12-20 DIAGNOSIS — Z7984 Long term (current) use of oral hypoglycemic drugs: Secondary | ICD-10-CM | POA: Insufficient documentation

## 2016-12-20 DIAGNOSIS — Z4802 Encounter for removal of sutures: Secondary | ICD-10-CM | POA: Diagnosis not present

## 2016-12-20 DIAGNOSIS — E119 Type 2 diabetes mellitus without complications: Secondary | ICD-10-CM | POA: Diagnosis not present

## 2016-12-20 DIAGNOSIS — Z79899 Other long term (current) drug therapy: Secondary | ICD-10-CM | POA: Diagnosis not present

## 2016-12-20 NOTE — ED Notes (Signed)
Patient denies pain and is resting comfortably.  

## 2016-12-20 NOTE — ED Notes (Signed)
Patient does not appear to be in any acute distress at time of discharge. Patient ambulatory to lobby with steady gate. Patient denies any comments or concerns regarding discharge.  

## 2016-12-20 NOTE — ED Triage Notes (Signed)
Pt arrives for suture removal to right forehead

## 2016-12-20 NOTE — ED Provider Notes (Signed)
[facility] Emergency Department Provider Note   ____________________________________________   First MD Initiated Contact with Patient 12/20/16 1121     (approximate)  I have reviewed the triage vital signs and the nursing notes.   HISTORY  Chief Complaint Suture / Staple Removal    HPI Aniceto Bossancy J Charlesworth is a 66 y.o. female patient presents for suture removal negative for a laceration 7 days ago. He stated no complaints.   Past Medical History:  Diagnosis Date  . Asthma   . Bronchitis   . Diabetes mellitus without complication [facility])     Patient Active Problem List   Diagnosis Date Noted  . Asthmatic bronchitis 07/14/2016  . Near syncope 07/13/2016    Past Surgical History:  Procedure Laterality Date  . CHOLECYSTECTOMY      Prior to Admission medications   Medication Sig Start Date End Date Taking? Authorizing Provider  albuterol (PROVENTIL) (2.5 MG/3ML) 0.083% nebulizer solution Take 3 mLs (2.5 mg total) by nebulization every 4 (four) hours. 07/18/16   Enid BaasKalisetti, Radhika, MD  benzonatate (TESSALON) 200 MG capsule Take 1 capsule (200 mg total) by mouth 3 (three) times daily. Patient not taking: Reported on 11/08/2016 07/18/16   Enid BaasKalisetti, Radhika, MD  carvedilol (COREG) 6.25 MG tablet Take 6.25 mg by mouth 2 (two) times daily with a meal.    [provider]  chlorpheniramine-HYDROcodone (TUSSIONEX) 10-8 MG/5ML SUER Take 5 mLs by mouth every 12 (twelve) hours. Patient not taking: Reported on 11/08/2016 07/18/16   Enid BaasKalisetti, Radhika, MD  Fluticasone-Salmeterol (ADVAIR DISKUS) 100-50 MCG/DOSE AEPB Inhale 1 puff into the lungs 2 (two) times daily. 07/18/16 07/18/17  Enid BaasKalisetti, Radhika, MD  glipiZIDE (GLUCOTROL) 10 MG tablet Take 10 mg by mouth daily before breakfast.    [provider]  lisinopril-hydrochlorothiazide (PRINZIDE,ZESTORETIC) 20-25 MG tablet Take 1 tablet by mouth daily.    [provider]  metFORMIN (GLUCOPHAGE)  1000 MG tablet Take 1,000 mg by mouth 2 (two) times daily with a meal.    [provider]  oxyCODONE-acetaminophen (PERCOCET) 5-325 MG tablet Take 1-2 tablets by mouth every 8 (eight) hours as needed. 12/16/16   Emily FilbertWilliams, Jonathan E, MD  predniSONE (STERAPRED UNI-PAK 21 TAB) 10 MG (21) TBPK tablet 6 tabs PO x 1 day 5 tabs PO x 2 days 4 tabs PO x 2 days 3 tabs PO x 2 days 2 tabs PO x 2 days 1 tab PO x 2 days and stop Patient not taking: Reported on 11/08/2016 07/18/16   Enid BaasKalisetti, Radhika, MD  Probiotic Product (PROBIOTIC DAILY) CAPS Take 2 capsules by mouth every morning. Patient not taking: Reported on 11/08/2016 09/16/16   Emily FilbertWilliams, Jonathan E, MD  tiotropium [facility](SPIRIVA) 18 MCG inhalation capsule Place 1 capsule (18 mcg total) into inhaler and inhale daily. 07/19/16   Enid BaasKalisetti, Radhika, MD    Allergies Patient has no known allergies.  History reviewed. No pertinent family history.  Social History Social History  Substance Use Topics  . Smoking status: Never Smoker  . Smokeless tobacco: Never Used  . Alcohol use No    Review of Systems Constitutional: No fever/chills Eyes: No visual changes. ENT: No sore throat. Cardiovascular: Denies chest pain. Respiratory: Denies shortness of breath. Gastrointestinal: No abdominal pain.  No nausea, no vomiting.  No diarrhea.  No constipation. Genitourinary: Negative for dysuria. Musculoskeletal: Negative for back pain. Skin: Negative for rash. Neurological: Negative for headaches, focal weakness or numbness. Endocrine:Diabetes and hypertension ____________________________________________   PHYSICAL EXAM:  VITAL SIGNS: ED  Triage Vitals  Enc Vitals Group     BP 12/20/16 1104 (!) 177/71     Pulse Rate 12/20/16 1104 97     Resp 12/20/16 1104 18     Temp 12/20/16 1104 (!) 97.4 F (36.3 C)     Temp Source 12/20/16 1104 Oral     SpO2 12/20/16 1104 96 %     Weight 12/20/16 1100 220 lb (99.8 kg)     Height 12/20/16 1100 5\' 2"   (1.575 m)     Head Circumference --      Peak Flow --      Pain Score 12/20/16 1100 0     Pain Loc --      Pain Edu? --      Excl. in GC? --     Constitutional: Alert and oriented. Well appearing and in no acute distress. Cardiovascular: Normal rate, regular rhythm. Grossly normal heart sounds.  Good peripheral circulation. Elevated blood pressure Respiratory: Normal respiratory effort.  No retractions. Lungs CTAB. Neurologic:  Normal speech and language. No gross focal neurologic deficits are appreciated. No gait instability. Skin:  Healed laceration right forehead Psychiatric: Mood and affect are normal. Speech and behavior are normal.  ____________________________________________   LABS (all labs ordered are listed, but only abnormal results are displayed)  Labs Reviewed - No data to display ____________________________________________  EKG   ____________________________________________  RADIOLOGY  No results found.  ____________________________________________   PROCEDURES  Procedure(s) performed: None  Procedures  Critical Care performed: No  ____________________________________________   INITIAL IMPRESSION / ASSESSMENT AND PLAN / ED COURSE  As part of my medical decision making, I reviewed the following data within the electronic MEDICAL RECORD NUMBER    Patient presents for suture removal status post laceration to the right forehead 7 days ago. Area was cleaned and 6 interrupted sutures were removed. Patient area was clean and bandage. Patient given discharge Instructions.      ____________________________________________   FINAL CLINICAL IMPRESSION(S) / ED DIAGNOSES  Final diagnoses:  Visit for suture removal      NEW MEDICATIONS STARTED DURING THIS VISIT:  New Prescriptions   No medications on file     Note:  This document was prepared using Dragon voice recognition software and may include unintentional dictation errors.    Taronda, Comacho, PA-C 12/20/16 1129    Emily Filbert, MD 12/20/16 [phone]

## 2017-04-11 ENCOUNTER — Emergency Department
Admission: EM | Admit: 2017-04-11 | Discharge: 2017-04-11 | Disposition: A | Discharge status: Home / Self Care | Payer: Medicaid Other | Attending: Emergency Medicine | Admitting: Emergency Medicine

## 2017-04-11 ENCOUNTER — Other Ambulatory Visit: Payer: Self-pay

## 2017-04-11 DIAGNOSIS — G43901 Migraine, unspecified, not intractable, with status migrainosus: Secondary | ICD-10-CM | POA: Diagnosis not present

## 2017-04-11 DIAGNOSIS — E119 Type 2 diabetes mellitus without complications: Secondary | ICD-10-CM | POA: Insufficient documentation

## 2017-04-11 DIAGNOSIS — Z7984 Long term (current) use of oral hypoglycemic drugs: Secondary | ICD-10-CM | POA: Insufficient documentation

## 2017-04-11 DIAGNOSIS — R51 Headache: Secondary | ICD-10-CM | POA: Diagnosis present

## 2017-04-11 DIAGNOSIS — J45909 Unspecified asthma, uncomplicated: Secondary | ICD-10-CM | POA: Insufficient documentation

## 2017-04-11 HISTORY — DX: Migraine, unspecified, not intractable, without status migrainosus: G43.909

## 2017-04-11 LAB — GLUCOSE, CAPILLARY: Glucose-Capillary: 133 mg/dL — ABNORMAL HIGH (ref 65–99)

## 2017-04-11 MED ORDER — METOCLOPRAMIDE HCL 5 MG/ML IJ SOLN
10.0000 mg | Freq: Once | INTRAMUSCULAR | Status: AC
  Administered 2017-04-11: 10 mg via INTRAVENOUS
  Filled 2017-04-11: qty 2

## 2017-04-11 MED ORDER — KETOROLAC TROMETHAMINE 30 MG/ML IJ SOLN
15.0000 mg | Freq: Once | INTRAMUSCULAR | Status: AC
  Administered 2017-04-11: 15 mg via INTRAVENOUS
  Filled 2017-04-11: qty 1

## 2017-04-11 MED ORDER — SODIUM CHLORIDE 0.9 % IV BOLUS (SEPSIS)
1000.0000 mL | Freq: Once | INTRAVENOUS | Status: AC
  Administered 2017-04-11: 1000 mL via INTRAVENOUS

## 2017-04-11 MED ORDER — ACETAMINOPHEN 500 MG PO TABS
1000.0000 mg | ORAL_TABLET | Freq: Once | ORAL | Status: AC
  Administered 2017-04-11: 1000 mg via ORAL
  Filled 2017-04-11: qty 2

## 2017-04-11 MED ORDER — DIPHENHYDRAMINE HCL 50 MG/ML IJ SOLN
25.0000 mg | Freq: Once | INTRAMUSCULAR | Status: AC
  Administered 2017-04-11: 25 mg via INTRAVENOUS
  Filled 2017-04-11: qty 1

## 2017-04-11 NOTE — ED Notes (Signed)
First Nurse Note:  Patient complaining of frontal headache X 6 days unrelieved by Tylenol.  Alert and oriented.

## 2017-04-11 NOTE — ED Notes (Signed)
Pt ambulatory upon discharge. Verbalized understanding of discharge instructions, follow-up care and pain management. Blood pressure high, but patient states that she has hypertension and has not yet taken AM medications d/t being in hospital. Pt states she will take meds when she gets home. A&O x4. Skin warm and dry.

## 2017-04-11 NOTE — Discharge Instructions (Signed)
You have been seen in the Emergency Department (ED) for a headache. Your evaluation today was overall reassuring. Headaches have many possible causes. Most headaches aren't a sign of a more serious problem, and they will get better on their own.   Follow-up with your doctor in 12-24 hours if you are still having a headache. Otherwise follow up with your doctor in 3-5 days.  For pain take tylenol 1000mg  (2 extra strength) every 8 hours or 600 mg ibuprofen every 6 hours  When should you call for help?  Call 911 or return to the ED anytime you think you may need emergency care. For example, call if:  You have signs of a stroke. These may include:  Sudden numbness, paralysis, or weakness in your face, arm, or leg, especially on only one side of your body.  Sudden vision changes.  Sudden trouble speaking.  Sudden confusion or trouble understanding simple statements.  Sudden problems with walking or balance.  A sudden, severe headache that is different from past headaches. You have new or worsening headache Nausea and vomiting associated with your headache Fever, neck stiffness associated with your headache  Call your doctor now or seek immediate medical care if:  You have a new or worse headache.  Your headache gets much worse.  How can you care for yourself at home?  Do not drive if you have taken a prescription pain medicine.  Rest in a quiet, dark room until your headache is gone. Close your eyes and try to relax or go to sleep. Don't watch TV or read.  Put a cold, moist cloth or cold pack on the painful area for 10 to 20 minutes at a time. Put a thin cloth between the cold pack and your skin.  Use a warm, moist towel or a heating pad set on low to relax tight shoulder and neck muscles.  Have someone gently massage your neck and shoulders.  Take pain medicines exactly as directed.  If the doctor gave you a prescription medicine for pain, take it as prescribed.  If you are not taking a  prescription pain medicine, ask your doctor if you can take an over-the-counter medicine. Be careful not to take pain medicine more often than the instructions allow, because you may get worse or more frequent headaches when the medicine wears off.  Do not ignore new symptoms that occur with a headache, such as a fever, weakness or numbness, vision changes, or confusion. These may be signs of a more serious problem.  To prevent headaches  Keep a headache diary so you can figure out what triggers your headaches. Avoiding triggers may help you prevent headaches. Record when each headache began, how long it lasted, and what the pain was like (throbbing, aching, stabbing, or dull). Write down any other symptoms you had with the headache, such as nausea, flashing lights or dark spots, or sensitivity to bright light or loud noise. Note if the headache occurred near your period. List anything that might have triggered the headache, such as certain foods (chocolate, cheese, wine) or odors, smoke, bright light, stress, or lack of sleep.  Find healthy ways to deal with stress. Headaches are most common during or right after stressful times. Take time to relax before and after you do something that has caused a headache in the past.  Try to keep your muscles relaxed by keeping good posture. Check your jaw, face, neck, and shoulder muscles for tension, and try relaxing them. When sitting at  a desk, change positions often, and stretch for 30 seconds each hour.  Get plenty of sleep and exercise.  Eat regularly and well. Long periods without food can trigger a headache.  Treat yourself to a massage. Some people find that regular massages are very helpful in relieving tension.  Limit caffeine by not drinking too much coffee, tea, or soda. But don't quit caffeine suddenly, because that can also give you headaches.  Reduce eyestrain from computers by blinking frequently and looking away from the computer screen every so  often. Make sure you have proper eyewear and that your monitor is set up properly, about an arm's length away.  Seek help if you have depression or anxiety. Your headaches may be linked to these conditions. Treatment can both prevent headaches and help with symptoms of anxiety or depression.

## 2017-04-11 NOTE — ED Triage Notes (Signed)
Pt c/o frontal HA for the past 6 days, states she has a hx of migraines in the past. States OTC meds are not helping.

## 2017-04-11 NOTE — ED Notes (Signed)
ED Provider at bedside. 

## 2017-04-11 NOTE — ED Provider Notes (Signed)
[facility] Emergency Department Provider Note  ____________________________________________  Time seen: Approximately 8:30 AM  I have reviewed the triage vital signs and the nursing notes.   HISTORY  Chief Complaint Headache   HPI Wendy Summers is a 67 y.o. female with history of migraines, diabetes, and asthma who presents for evaluation of a migraine. Patient reports that the headache started 6 days ago initially mild and got progressively worse over the course of the last 6 days. She's been taking 650 mg of Tylenol twice a day with minimal relief. The pain is currently 10 out of 10, located in the left side of her head, sharp, constant and nonradiating, associated with photophobia. This headache is identical to her prior migraine headaches. Patient denies changes in vision, weakness or numbness, sudden onset HA or HA with maximal intensity at onset, fever, neck stiffness, history of immunosuppression, Jaw claudication, muscle aches, temporal artery pain, history of other household members with similar symptoms, pregnancy, clotting disorder, trauma, eye pain, recent cervical manipulation, or dizziness with headache.    Past Medical History:  Diagnosis Date  . Asthma   . Bronchitis   . Diabetes mellitus without complication (HCC)   . Migraine     Patient Active Problem List   Diagnosis Date Noted  . Asthmatic bronchitis 07/14/2016  . Near syncope 07/13/2016    Past Surgical History:  Procedure Laterality Date  . CHOLECYSTECTOMY      Prior to Admission medications   Medication Sig Start Date End Date Taking? Authorizing Provider  albuterol (PROVENTIL) (2.5 MG/3ML) 0.083% nebulizer solution Take 3 mLs (2.5 mg total) by nebulization every 4 (four) hours. 07/18/16  Yes Enid BaasKalisetti, Radhika, MD  carvedilol (COREG) 6.25 MG tablet Take 6.25 mg by mouth 2 (two) times daily with a meal.   Yes [provider]  Fluticasone-Salmeterol (ADVAIR DISKUS)  100-50 MCG/DOSE AEPB Inhale 1 puff into the lungs 2 (two) times daily. 07/18/16 07/18/17 Yes Enid BaasKalisetti, Radhika, MD  glipiZIDE (GLUCOTROL) 10 MG tablet Take 10 mg by mouth daily before breakfast.   Yes [provider]  latanoprost (XALATAN) 0.005 % ophthalmic solution Place 1 drop into both eyes at bedtime.   Yes [provider]  lisinopril (PRINIVIL,ZESTRIL) 20 MG tablet Take 20 mg by mouth daily. for high blood pressure 03/07/17  Yes [provider]  lisinopril-hydrochlorothiazide (PRINZIDE,ZESTORETIC) 20-25 MG tablet Take 1 tablet by mouth daily.   Yes [provider]  metFORMIN (GLUCOPHAGE) 1000 MG tablet Take 2,000 mg by mouth daily after supper.    Yes [provider]  tiotropium (SPIRIVA) 18 MCG inhalation capsule Place 1 capsule (18 mcg total) into inhaler and inhale daily. 07/19/16  Yes Enid BaasKalisetti, Radhika, MD  benzonatate (TESSALON) 200 MG capsule Take 1 capsule (200 mg total) by mouth 3 (three) times daily. Patient not taking: Reported on 11/08/2016 07/18/16   Enid BaasKalisetti, Radhika, MD  chlorpheniramine-HYDROcodone (TUSSIONEX) 10-8 MG/5ML SUER Take 5 mLs by mouth every 12 (twelve) hours. Patient not taking: Reported on 11/08/2016 07/18/16   Enid BaasKalisetti, Radhika, MD  oxyCODONE-acetaminophen (PERCOCET) 5-325 MG tablet Take 1-2 tablets by mouth every 8 (eight) hours as needed. Patient not taking: Reported on 04/11/2017 12/16/16   Emily FilbertWilliams, Jonathan E, MD  predniSONE (STERAPRED UNI-PAK 21 TAB) 10 MG (21) TBPK tablet 6 tabs PO x 1 day 5 tabs PO x 2 days 4 tabs PO x 2 days 3 tabs PO x 2 days 2 tabs PO x 2 days 1 tab PO x 2 days and  stop Patient not taking: Reported on 11/08/2016 07/18/16   Enid Baas, MD  Probiotic Product (PROBIOTIC DAILY) CAPS Take 2 capsules by mouth every morning. Patient not taking: Reported on 11/08/2016 09/16/16   Emily Filbert, MD    Allergies Patient has no known allergies.  No family history on file.  Social  History Social History   Tobacco Use  . Smoking status: Never Smoker  . Smokeless tobacco: Never Used  Substance Use Topics  . Alcohol use: No  . Drug use: No    Review of Systems  Constitutional: Negative for fever. Eyes: Negative for visual changes. ENT: Negative for sore throat. Neck: No neck pain  Cardiovascular: Negative for chest pain. Respiratory: Negative for shortness of breath. Gastrointestinal: Negative for abdominal pain, vomiting or diarrhea. Genitourinary: Negative for dysuria. Musculoskeletal: Negative for back pain. Skin: Negative for rash. Neurological: Negative for weakness or numbness. + HA Psych: No SI or HI  ____________________________________________   PHYSICAL EXAM:  VITAL SIGNS: ED Triage Vitals  Enc Vitals Group     BP 04/11/17 0814 (!) 164/76     Pulse Rate 04/11/17 0813 94     Resp 04/11/17 0813 18     Temp 04/11/17 0813 97.7 F (36.5 C)     Temp Source 04/11/17 0813 Oral     SpO2 04/11/17 0813 95 %     Weight 04/11/17 0813 280 lb (127 kg)     Height 04/11/17 0813 5\' 3"  (1.6 m)     Head Circumference --      Peak Flow --      Pain Score 04/11/17 0813 10     Pain Loc --      Pain Edu? --      Excl. in GC? --     Constitutional: Alert and oriented. Well appearing and in no apparent distress. HEENT:      Head: Normocephalic and atraumatic.         Eyes: Conjunctivae are normal. Sclera is non-icteric.       Mouth/Throat: Mucous membranes are moist.       Neck: Supple with no signs of meningismus. Cardiovascular: Regular rate and rhythm. No murmurs, gallops, or rubs. 2+ symmetrical distal pulses are present in all extremities. No JVD. Respiratory: Normal respiratory effort. Lungs are clear to auscultation bilaterally. No wheezes, crackles, or rhonchi.  Gastrointestinal: Soft, non tender, and non distended with positive bowel sounds. No rebound or guarding. Musculoskeletal: Nontender with normal range of motion in all extremities. No  edema, cyanosis, or erythema of extremities. Neurologic: Normal speech and language. A & O x3, PERRL, EOMI, no nystagmus, CN II-XII intact, motor testing reveals good tone and bulk throughout. There is no evidence of pronator drift or dysmetria. Muscle strength is 5/5 throughout.  Sensory examination is intact. Gait is normal. Skin: Skin is warm, dry and intact. No rash noted. Psychiatric: Mood and affect are normal. Speech and behavior are normal.  ____________________________________________   LABS (all labs ordered are listed, but only abnormal results are displayed)  Labs Reviewed  GLUCOSE, CAPILLARY - Abnormal; Notable for the following components:      Result Value   Glucose-Capillary 133 (*)    All other components within normal limits  CBG MONITORING, ED   ____________________________________________  EKG  none  ____________________________________________  RADIOLOGY  none  ____________________________________________   PROCEDURES  Procedure(s) performed: None Procedures Critical Care performed:  None ____________________________________________   INITIAL IMPRESSION / ASSESSMENT AND PLAN / ED COURSE  66  y.o. female with history of migraines, diabetes, and asthma who presents for evaluation of a migraine.   Low suspicion for more serious or life threatening etiology of HA based on history and exam. No sudden onset thunderclap HA, onset with exertion, vomiting, focal neurologic deficits, to suggest increased risk of subarachnoid hemorrhage. No fever, neck pain, neck stiffness, or meningismus on exam to suggest meningitis. No fevers, altered mental status, unusual behavior to suggest encephalitis. No focal neurologic deficits by history or exam to suggest central venous thrombosis. No constitutional symptoms including fever, fatigue, weight loss, temporal scalp tenderness, jaw claudication, visual loss, to suggest temporal arteritis. No immunocompromise to suggest  increased risk for intracranial infectious disease. No visual changes or findings on ocular exam to suggest acute angle closure glaucoma. No reports of toxic exposures including carbon monoxide or other household members with similar symptoms.  Will treat with migraine cocktail and reassess.     _________________________ 10:31 AM on 04/11/2017 -----------------------------------------  Pain is fully resolved. Patient remains extremely well appearing and neurologically intact. She is given a be discharged home on supportive care and follow-up with primary care doctor. Discussed return precautions.   As part of my medical decision making, I reviewed the following data within the electronic MEDICAL RECORD NUMBER Nursing notes reviewed and incorporated, Labs reviewed , Notes from prior ED visits and [place] Controlled Substance Database    Pertinent labs & imaging results that were available during my care of the patient were reviewed by me and considered in my medical decision making (see chart for details).    ____________________________________________   FINAL CLINICAL IMPRESSION(S) / ED DIAGNOSES  Final diagnoses:  Migraine with status migrainosus, not intractable, unspecified migraine type      NEW MEDICATIONS STARTED DURING THIS VISIT:  ED Discharge Orders    None       Note:  This document was prepared using Dragon voice recognition software and may include unintentional dictation errors.    Don Perking, [place], MD 04/11/17 1031

## 2017-06-02 ENCOUNTER — Other Ambulatory Visit: Payer: Self-pay | Admitting: Obstetrics and Gynecology

## 2017-06-04 ENCOUNTER — Other Ambulatory Visit: Payer: Self-pay | Admitting: Obstetrics and Gynecology

## 2017-10-25 ENCOUNTER — Other Ambulatory Visit: Payer: Self-pay | Admitting: Obstetrics and Gynecology

## 2017-10-26 ENCOUNTER — Other Ambulatory Visit: Payer: Self-pay | Admitting: Obstetrics and Gynecology

## 2017-10-29 ENCOUNTER — Other Ambulatory Visit: Payer: Self-pay | Admitting: Obstetrics and Gynecology

## 2017-11-17 ENCOUNTER — Other Ambulatory Visit: Payer: Self-pay | Admitting: Obstetrics and Gynecology

## 2017-11-17 DIAGNOSIS — I10 Essential (primary) hypertension: Secondary | ICD-10-CM

## 2017-11-17 DIAGNOSIS — Z1211 Encounter for screening for malignant neoplasm of colon: Secondary | ICD-10-CM

## 2017-11-17 DIAGNOSIS — Z13820 Encounter for screening for osteoporosis: Secondary | ICD-10-CM

## 2017-12-06 ENCOUNTER — Ambulatory Visit
Admission: RE | Admit: 2017-12-06 | Discharge: 2017-12-06 | Disposition: A | Discharge status: Home / Self Care | Payer: Medicaid Other | Source: Ambulatory Visit | Attending: Obstetrics and Gynecology | Admitting: Obstetrics and Gynecology

## 2017-12-06 DIAGNOSIS — Z1211 Encounter for screening for malignant neoplasm of colon: Secondary | ICD-10-CM | POA: Diagnosis present

## 2017-12-06 DIAGNOSIS — Z13820 Encounter for screening for osteoporosis: Secondary | ICD-10-CM | POA: Insufficient documentation

## 2017-12-06 DIAGNOSIS — I10 Essential (primary) hypertension: Secondary | ICD-10-CM | POA: Diagnosis present

## 2017-12-15 IMAGING — CT CT CERVICAL SPINE W/O CM
3 of 4 series · 10 of 33 positions shown, 12 images · non-contrast
Comparison: Cervical spine radiographs 05/09/2008.

CLINICAL DATA: Syncopal episode today with head trauma and forehead
laceration. Headache.

EXAM:
CT CERVICAL SPINE WITHOUT CONTRAST
TECHNIQUE: Multidetector CT imaging of the cervical spine was performed without
intravenous contrast. Multiplanar CT image reconstructions were also
generated.

[Series 7: sagittal bone · sagittal · 0.21mm/px · 5 of 57 slices shown, 6 images]
[im 19/57  bone]
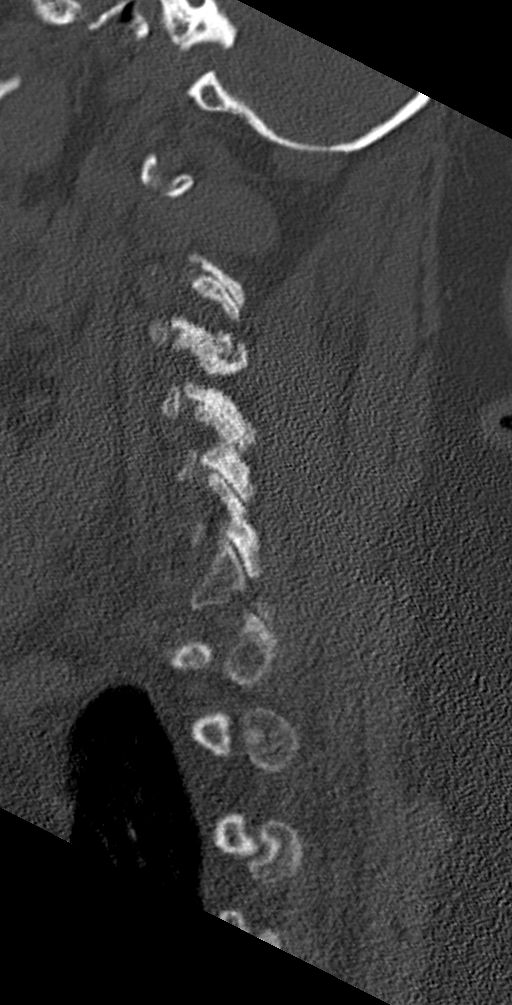
[im 24/57  bone]
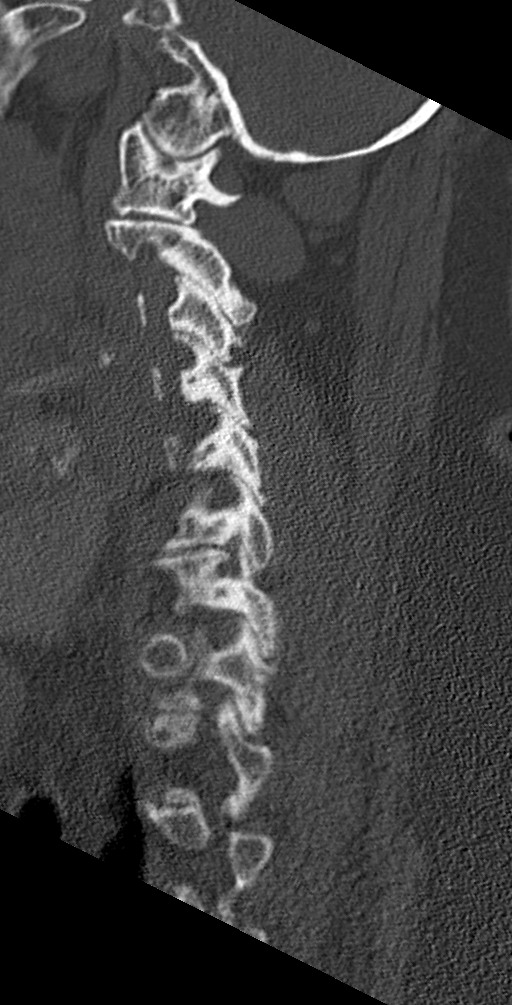
[im 29/57  soft-tissue]
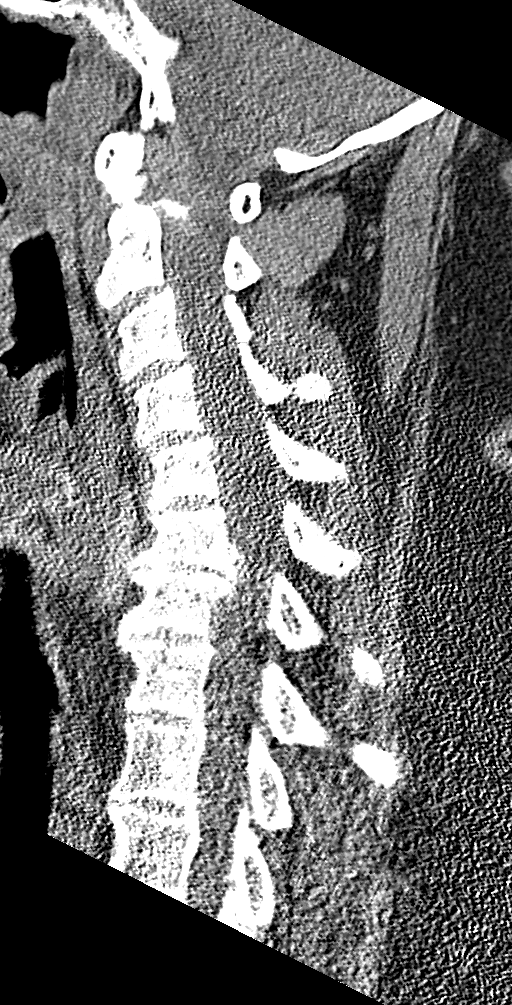
[im 29/57  bone]
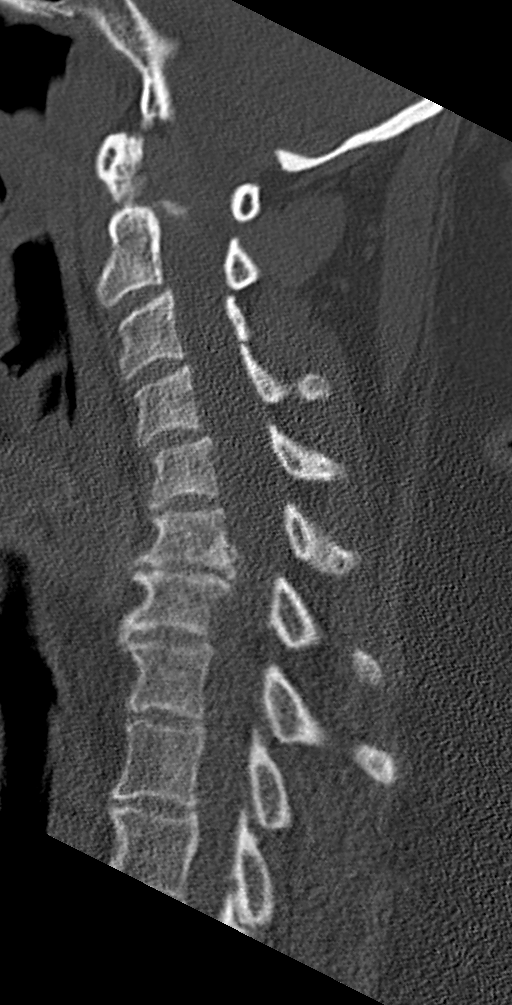
[im 33/57  bone]
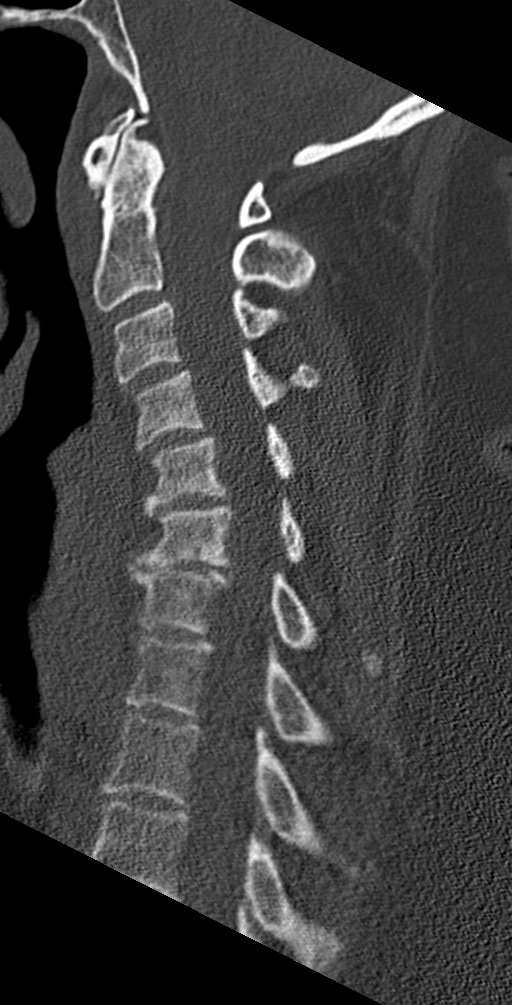
[im 38/57  bone]
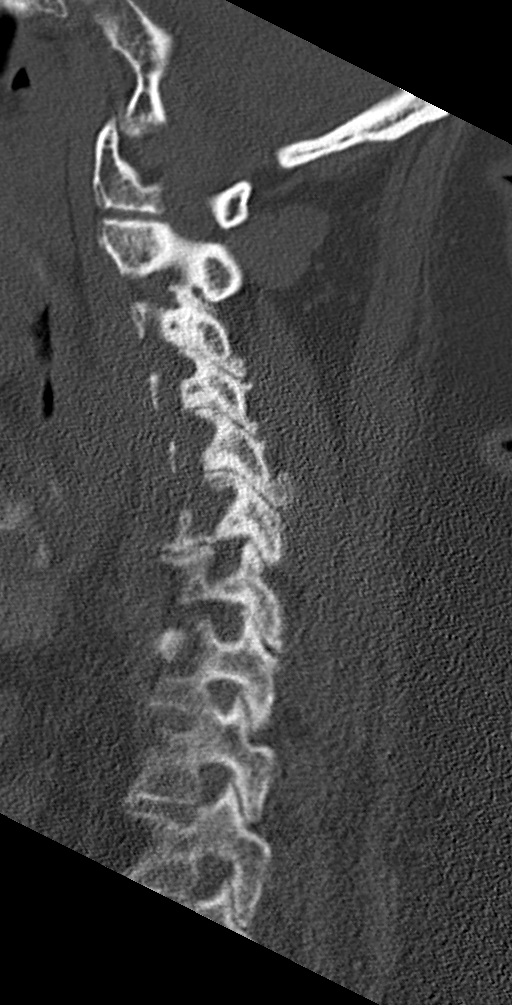

[Series 8: coronal bone · coronal · 0.25mm/px · 3 of 50 slices shown]
[im 10/50  bone]
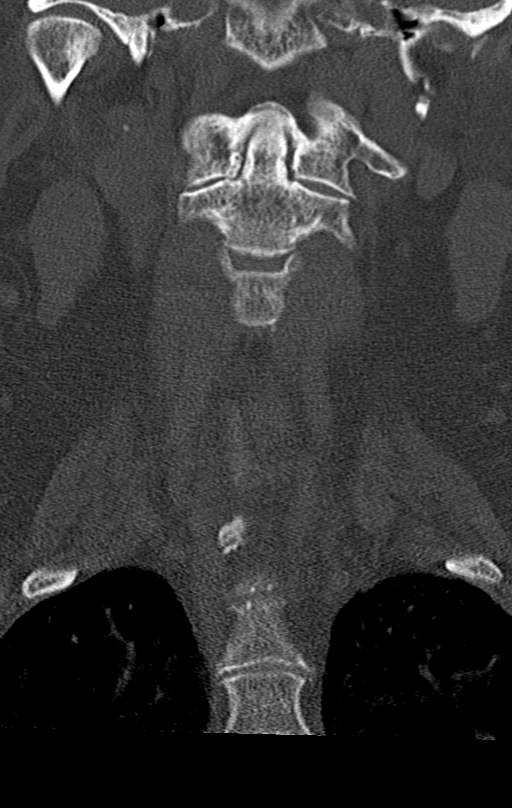
[im 20/50  bone]
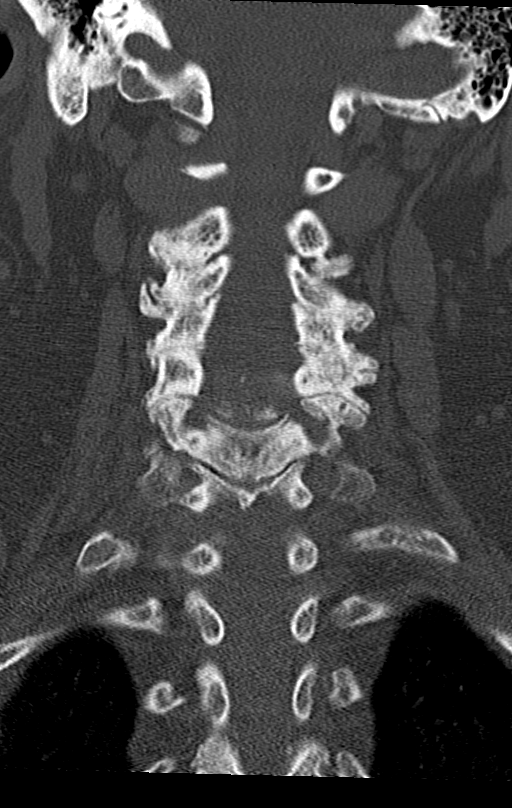
[im 30/50  bone]
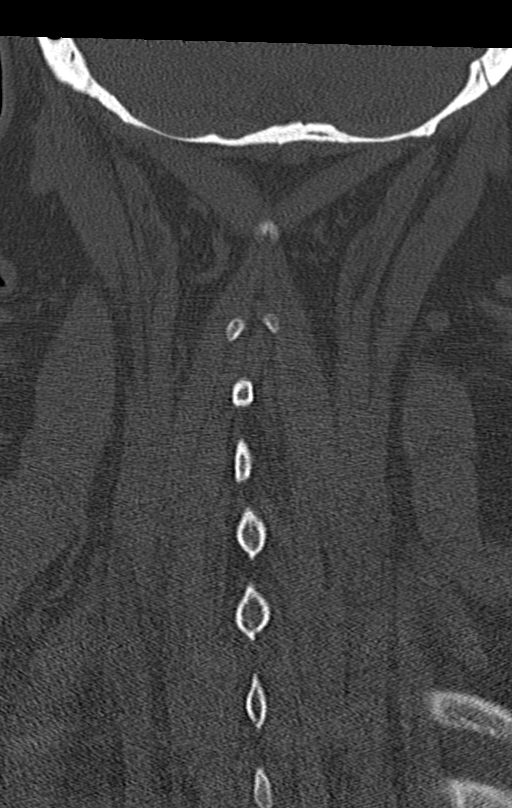

[Series 9: orthogonal bone · axial · 0.23mm/px · z∈[-269,-210]mm · 2 of 99 slices shown, 3 images]
[im 33/99  soft-tissue]
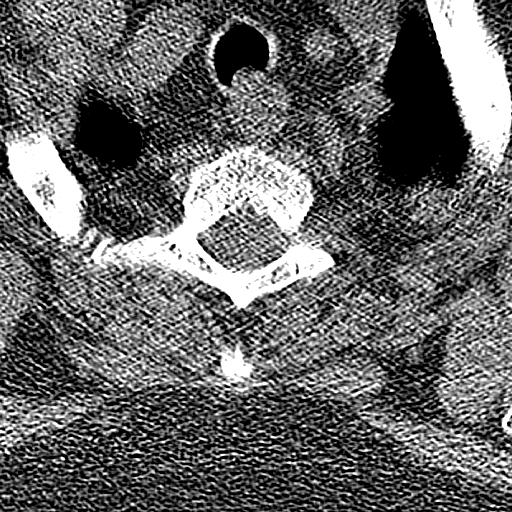
[im 33/99  bone]
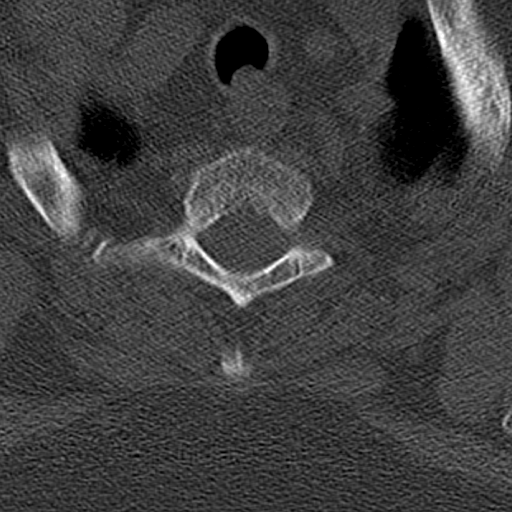
[im 66/99  bone]
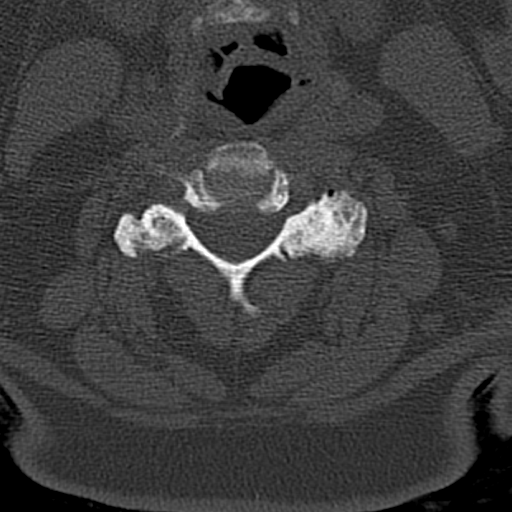

[10 of 33 positions shown; findings below may reference images not displayed]

FINDINGS: Alignment: There is reversal of the usual cervical lordosis with a
slight anterolisthesis at C4-5. There is no focal angulation.

Skull base and vertebrae: No evidence of acute fracture. There is
multilevel facet disease. Spondylosis is most advanced at C6-7.

Soft tissues and spinal canal: No prevertebral fluid or swelling. No
visible canal hematoma.

Disc levels: No large disc herniation is demonstrated. There is
multilevel spondylosis, most advanced at C6-7 where there is loss of
disc height with posterior osteophytes and bilateral uncinate
spurring contributing to mild-to-moderate spinal stenosis and
asymmetric narrowing of the right foramen. In addition, there is
multilevel facet disease contributing to mild asymmetric narrowing
of the left foramen at C3-4.

Upper chest: Unremarkable.

Other: None.
IMPRESSION: 1. No evidence of acute fracture, traumatic subluxation or static
signs of instability.
2. Multilevel cervical spondylosis, as detailed above.

## 2017-12-15 IMAGING — CT CT HEAD W/O CM
3 series · 15 of 47 positions shown, 18 images · non-contrast
Comparison: 09/16/2016

CLINICAL DATA: Syncope, head trauma with forehead laceration,
headache

EXAM:
CT HEAD WITHOUT CONTRAST
TECHNIQUE: Contiguous axial images were obtained from the base of the skull
through the vertex without intravenous contrast.

[Series 2: head wo · axial · 0.41mm/px · z∈[-192,-67]mm · 9 of 31 slices shown, 12 images]
[im 3/31  brain]
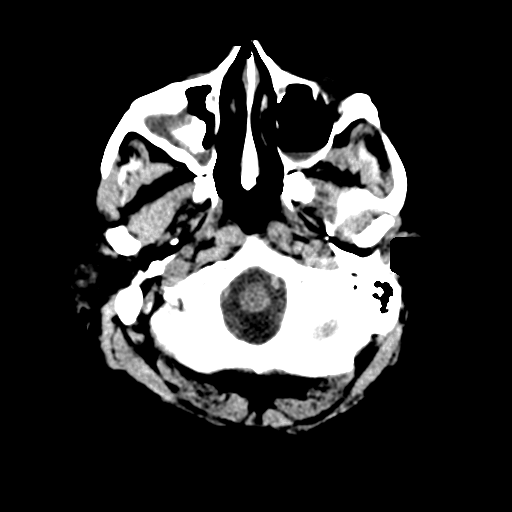
[im 3/31  bone]
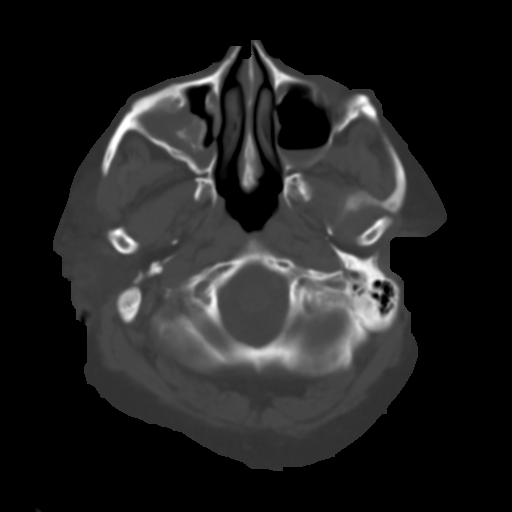
[im 6/31  brain]
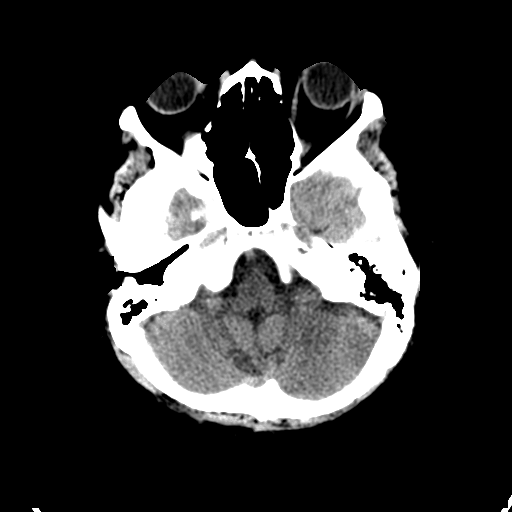
[im 9/31  brain]
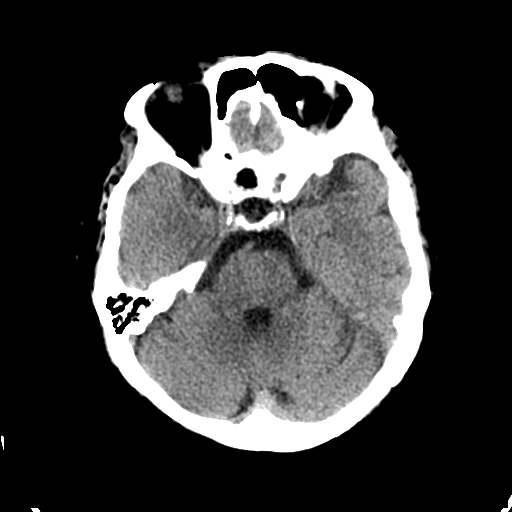
[im 12/31  brain]
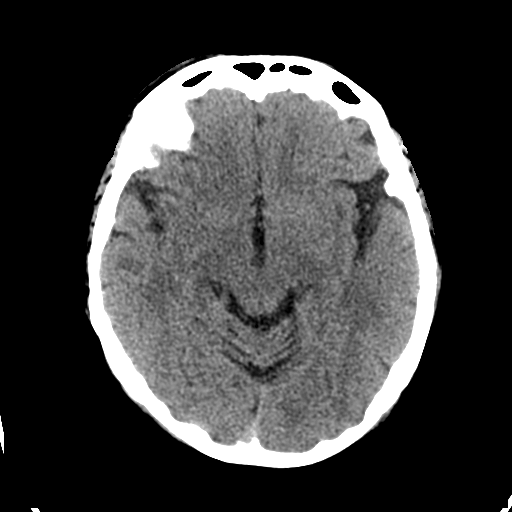
[im 16/31  brain]
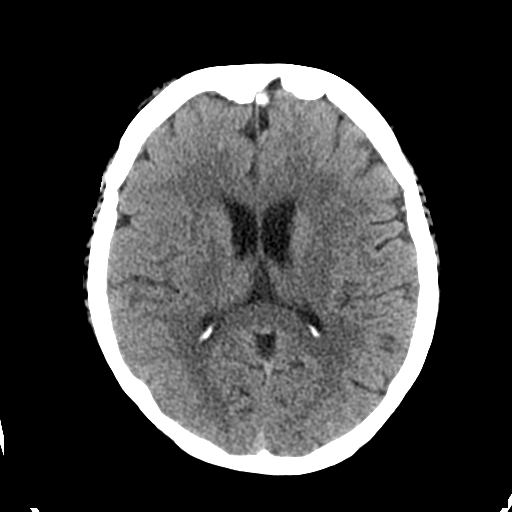
[im 16/31  bone]
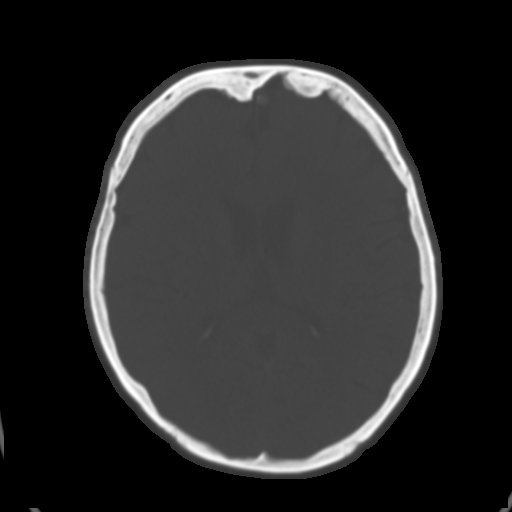
[im 19/31  brain]
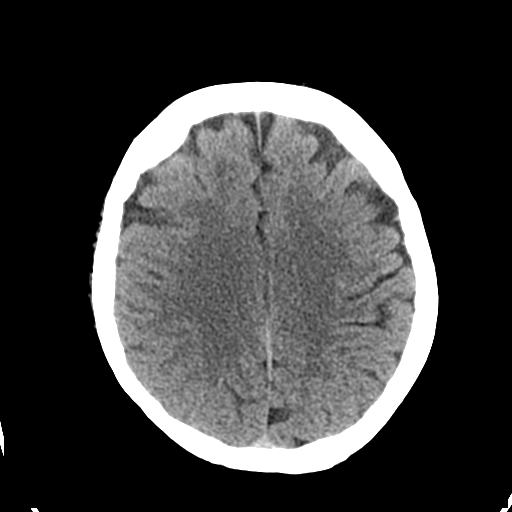
[im 22/31  brain]
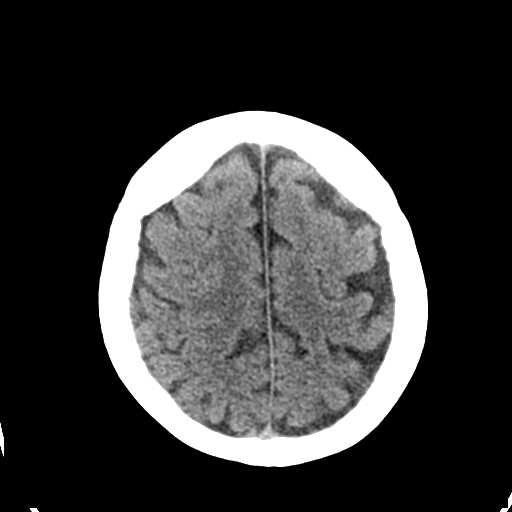
[im 25/31  brain]
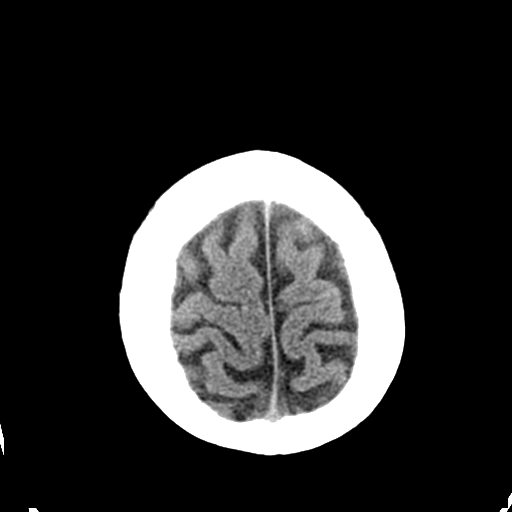
[im 28/31  brain]
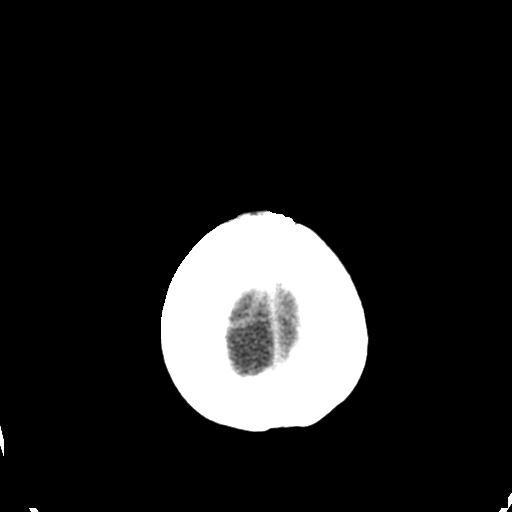
[im 28/31  bone]
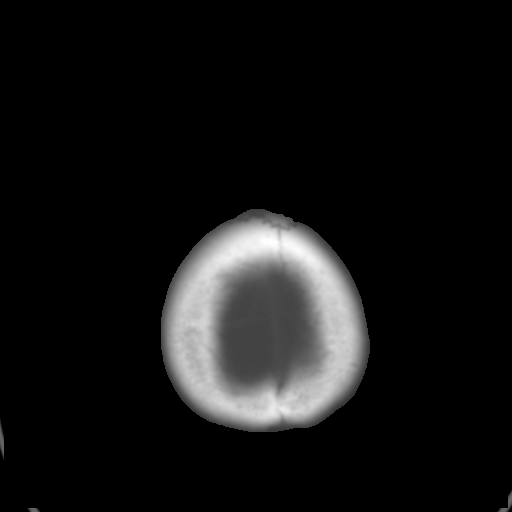

[Series 4: coronal soft tissue · coronal · 0.30mm/px · 3 of 58 slices shown]
[im 20/58  brain]
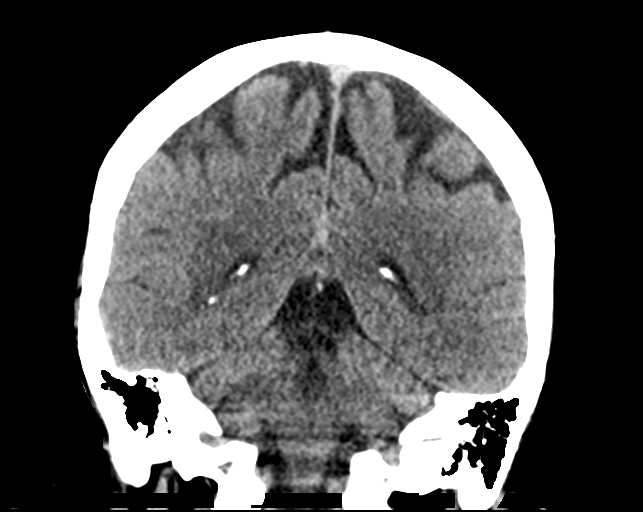
[im 26/58  brain]
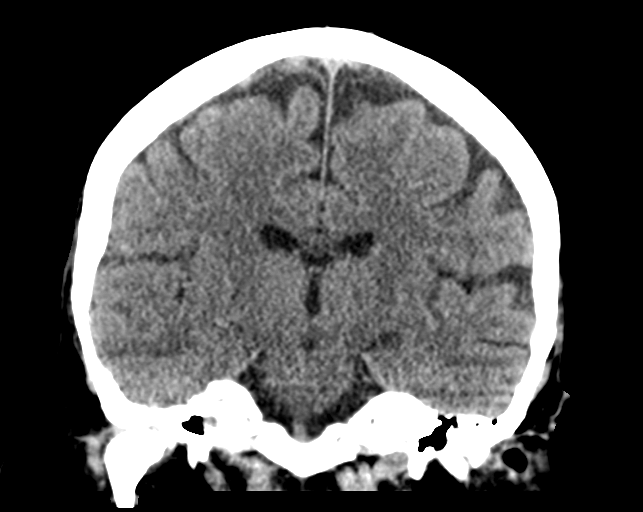
[im 32/58  brain]
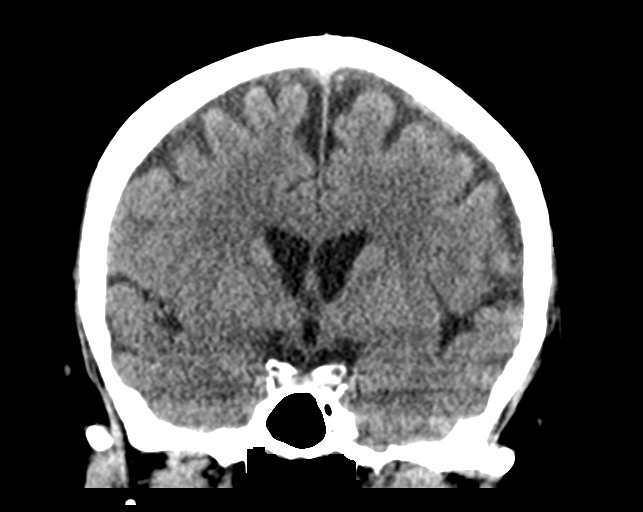

[Series 5: sagittal soft tissue · sagittal · 0.32mm/px · 3 of 52 slices shown]
[im 18/52  brain]
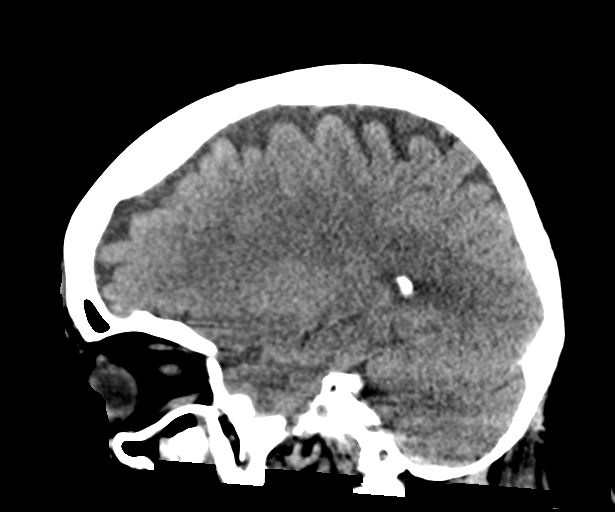
[im 26/52  brain]
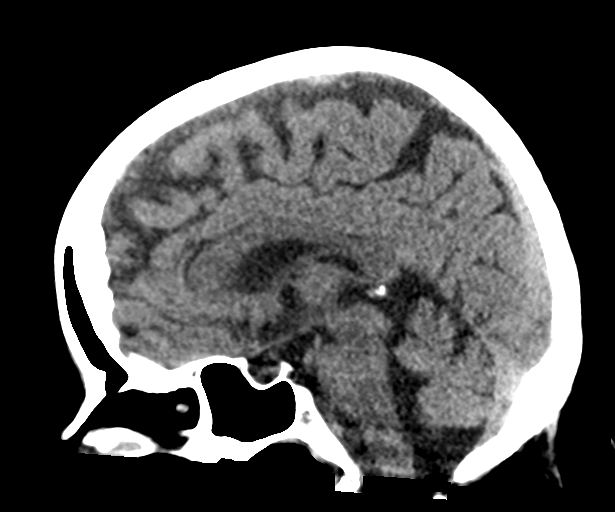
[im 35/52  brain]
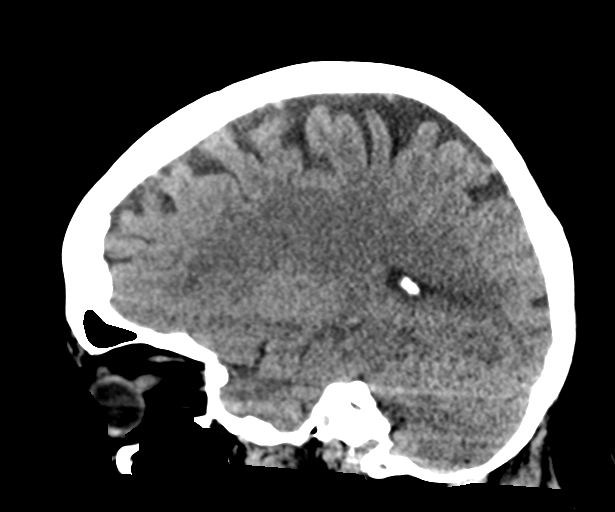

[15 of 47 positions shown; findings below may reference images not displayed]

FINDINGS: Brain: No evidence of acute infarction, hemorrhage, hydrocephalus,
extra-axial collection or mass lesion/mass effect.

Mild subcortical white matter and periventricular small vessel
ischemic changes.

Vascular: No hyperdense vessel or unexpected calcification.

Skull: Normal. Negative for fracture or focal lesion.

Sinuses/Orbits: Partial opacification of the bilateral maxillary
sinuses, right greater than left. Bilateral mastoid air cells are
clear.

Other: Soft tissue swelling/laceration overlying the right frontal
bone (series 3/image 30).
IMPRESSION: Soft tissue swelling/laceration overlying the right frontal bone. No
evidence of calvarial fracture.

No evidence of acute intracranial abnormality. Mild small vessel
ischemic changes.

## 2017-12-18 IMAGING — CR DG RIBS W/ CHEST 3+V*R*
1 series · 5 of 5 positions shown · non-contrast
Comparison: None.

CLINICAL DATA: Rib pain after falling 3 days ago.

EXAM:
RIGHT RIBS AND CHEST - 3+ VIEW

[Series 1: dg ribs unilateral w/chest right · 0.14mm/px · 5 of 5 slices shown]
[im 1/5]
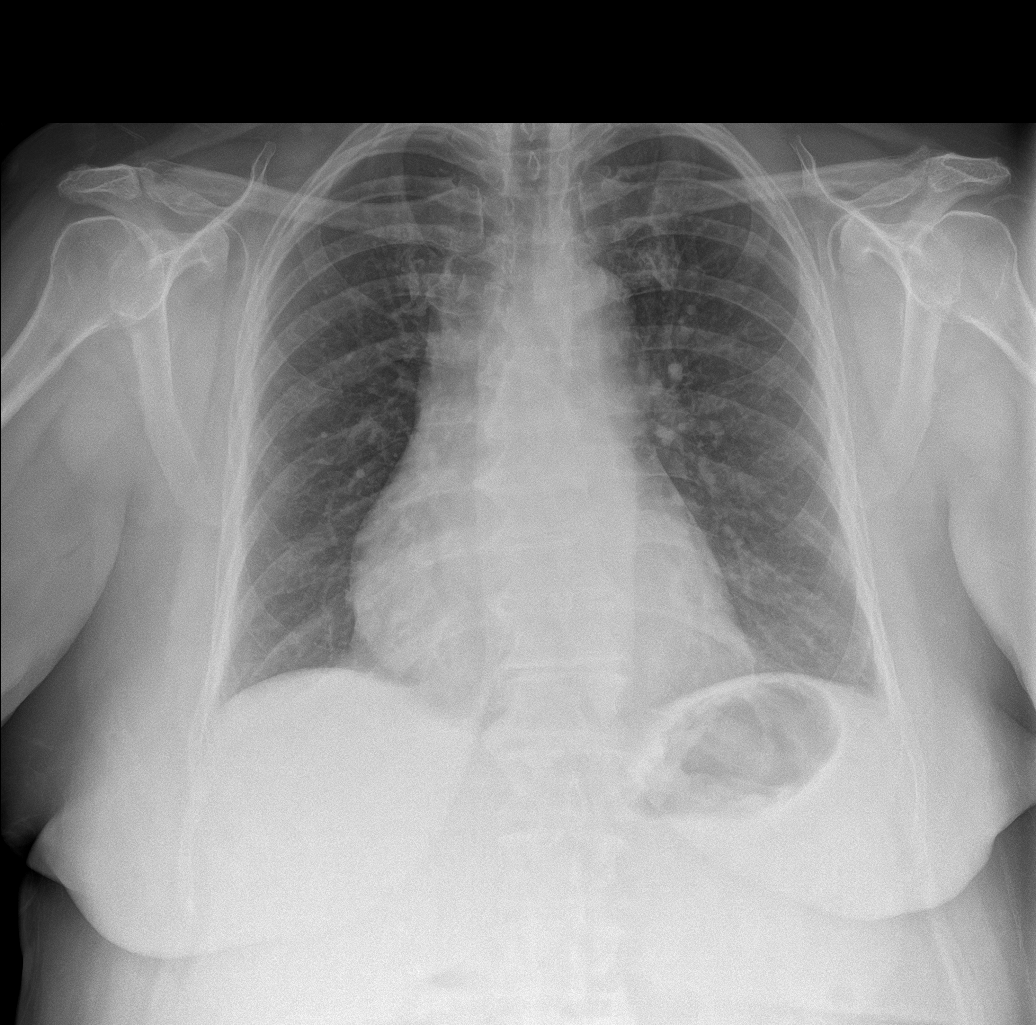
[im 2/5]
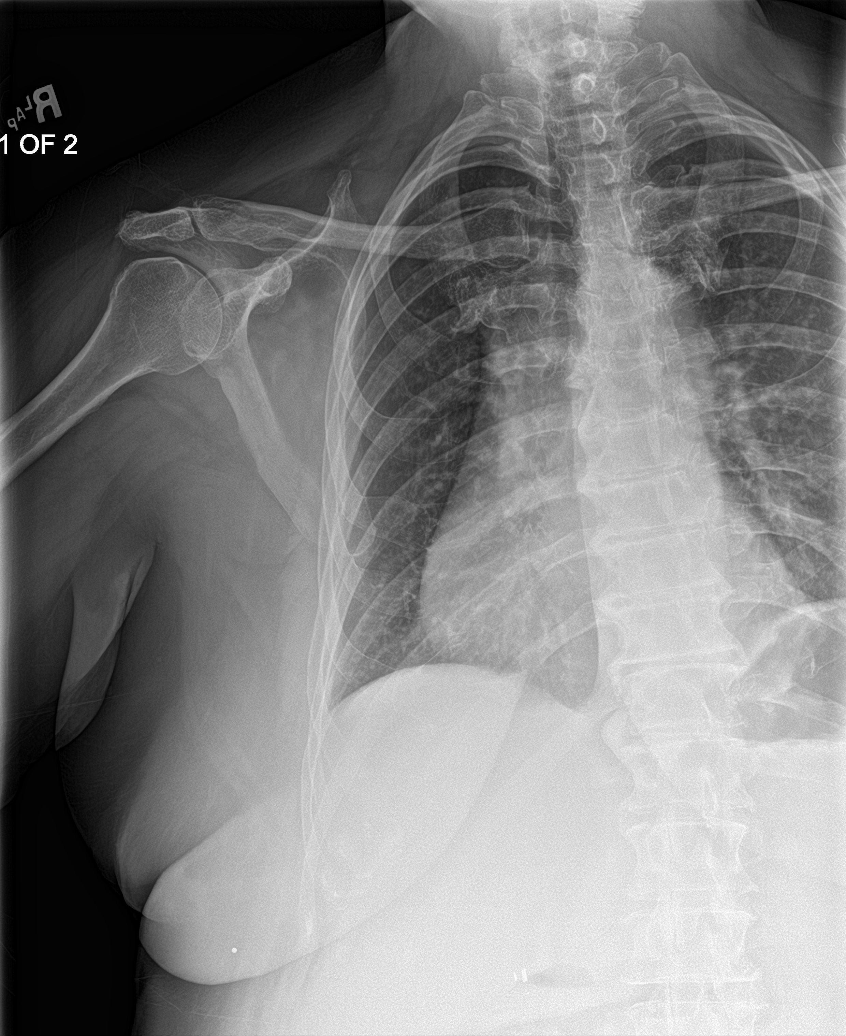
[im 3/5]
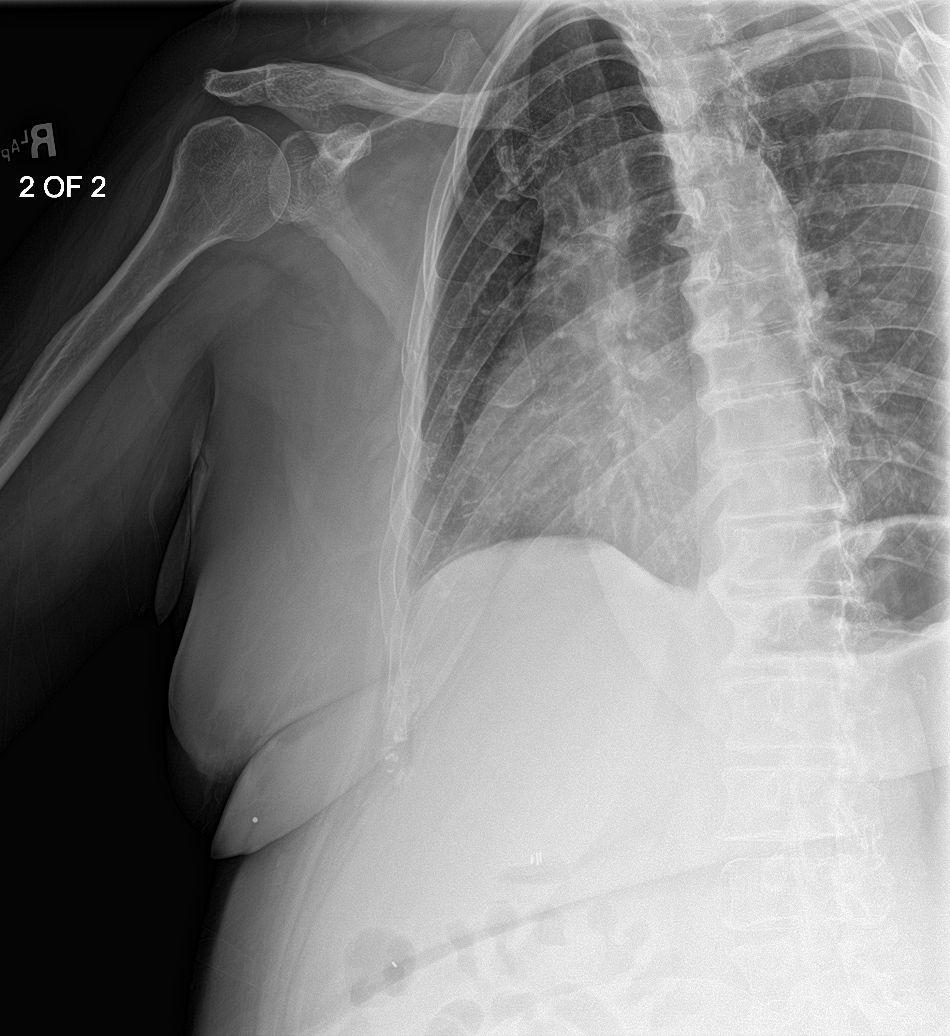
[im 4/5]
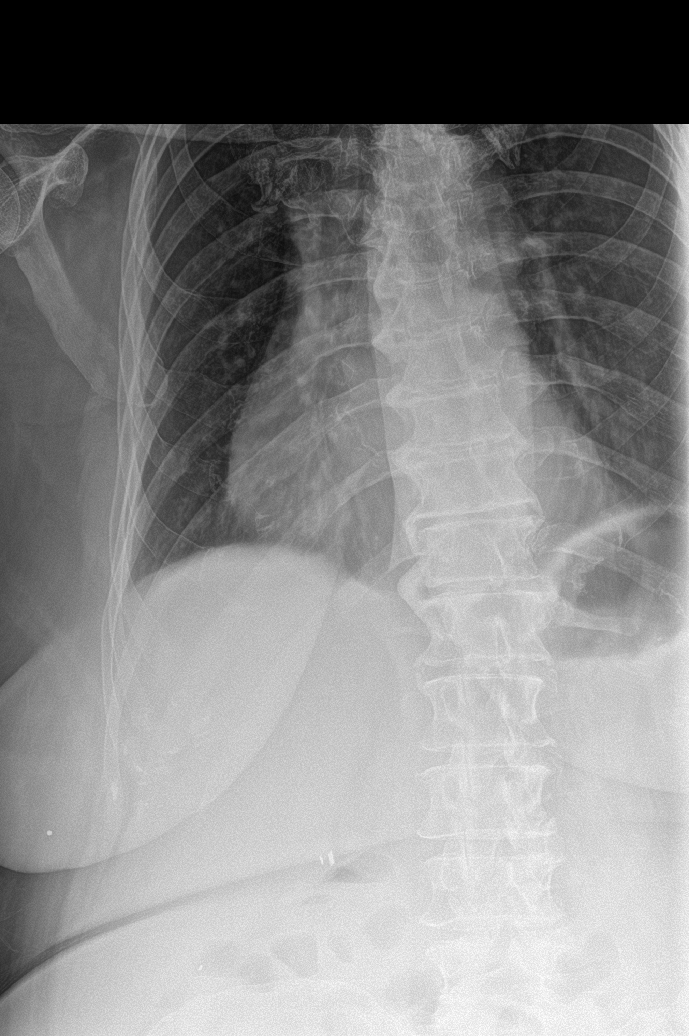
[im 5/5]
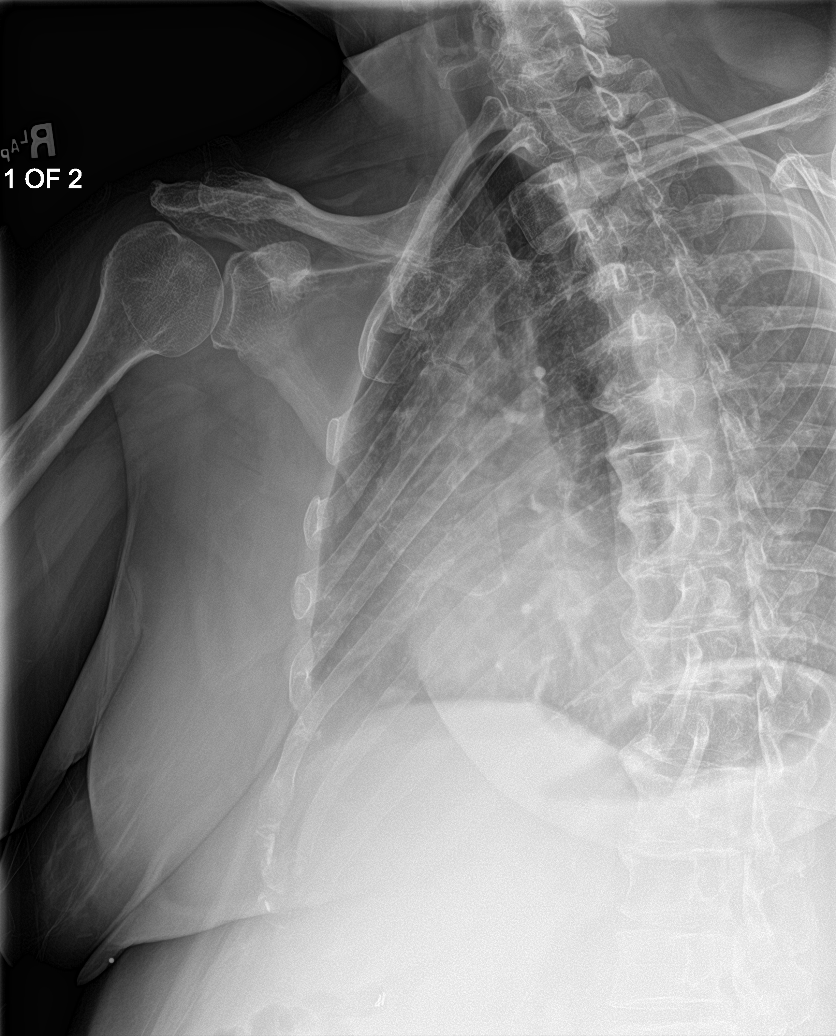

[5 of 5 positions shown; findings below may reference images not displayed]

FINDINGS: No fracture or other bone lesions are seen involving the ribs. There
is no evidence of pneumothorax or pleural effusion. Both lungs are
clear. Heart size remains mildly enlarged.
IMPRESSION: Mild cardiomegaly without focal airspace disease.  No rib fracture.

## 2017-12-29 ENCOUNTER — Other Ambulatory Visit: Payer: Self-pay | Admitting: Obstetrics and Gynecology

## 2018-01-13 ENCOUNTER — Other Ambulatory Visit: Payer: Self-pay | Admitting: Obstetrics and Gynecology

## 2018-02-25 ENCOUNTER — Emergency Department: Payer: Medicaid Other

## 2018-02-25 ENCOUNTER — Inpatient Hospital Stay
Admission: EM | Admit: 2018-02-25 | Discharge: 2018-03-02 | DRG: 202 | Disposition: A | Discharge status: Home / Self Care | Payer: Medicaid Other | Attending: Internal Medicine | Admitting: Internal Medicine

## 2018-02-25 ENCOUNTER — Encounter: Payer: Self-pay | Admitting: Emergency Medicine

## 2018-02-25 ENCOUNTER — Other Ambulatory Visit: Payer: Self-pay

## 2018-02-25 DIAGNOSIS — E669 Obesity, unspecified: Secondary | ICD-10-CM | POA: Diagnosis present

## 2018-02-25 DIAGNOSIS — T380X5A Adverse effect of glucocorticoids and synthetic analogues, initial encounter: Secondary | ICD-10-CM | POA: Diagnosis present

## 2018-02-25 DIAGNOSIS — Z6841 Body Mass Index (BMI) 40.0 and over, adult: Secondary | ICD-10-CM | POA: Diagnosis not present

## 2018-02-25 DIAGNOSIS — B029 Zoster without complications: Secondary | ICD-10-CM | POA: Diagnosis present

## 2018-02-25 DIAGNOSIS — Z7984 Long term (current) use of oral hypoglycemic drugs: Secondary | ICD-10-CM

## 2018-02-25 DIAGNOSIS — R0602 Shortness of breath: Secondary | ICD-10-CM | POA: Diagnosis not present

## 2018-02-25 DIAGNOSIS — E1165 Type 2 diabetes mellitus with hyperglycemia: Secondary | ICD-10-CM | POA: Diagnosis not present

## 2018-02-25 DIAGNOSIS — G43909 Migraine, unspecified, not intractable, without status migrainosus: Secondary | ICD-10-CM | POA: Diagnosis present

## 2018-02-25 DIAGNOSIS — Z800 Family history of malignant neoplasm of digestive organs: Secondary | ICD-10-CM | POA: Diagnosis not present

## 2018-02-25 DIAGNOSIS — I493 Ventricular premature depolarization: Secondary | ICD-10-CM | POA: Diagnosis present

## 2018-02-25 DIAGNOSIS — K859 Acute pancreatitis without necrosis or infection, unspecified: Secondary | ICD-10-CM

## 2018-02-25 DIAGNOSIS — Z7951 Long term (current) use of inhaled steroids: Secondary | ICD-10-CM | POA: Diagnosis not present

## 2018-02-25 DIAGNOSIS — J209 Acute bronchitis, unspecified: Secondary | ICD-10-CM | POA: Diagnosis present

## 2018-02-25 DIAGNOSIS — J45901 Unspecified asthma with (acute) exacerbation: Secondary | ICD-10-CM | POA: Diagnosis not present

## 2018-02-25 DIAGNOSIS — I10 Essential (primary) hypertension: Secondary | ICD-10-CM | POA: Diagnosis present

## 2018-02-25 DIAGNOSIS — Z79899 Other long term (current) drug therapy: Secondary | ICD-10-CM | POA: Diagnosis not present

## 2018-02-25 DIAGNOSIS — Z9049 Acquired absence of other specified parts of digestive tract: Secondary | ICD-10-CM

## 2018-02-25 DIAGNOSIS — R748 Abnormal levels of other serum enzymes: Secondary | ICD-10-CM | POA: Diagnosis present

## 2018-02-25 DIAGNOSIS — J9601 Acute respiratory failure with hypoxia: Secondary | ICD-10-CM | POA: Diagnosis present

## 2018-02-25 LAB — URINALYSIS, COMPLETE (UACMP) WITH MICROSCOPIC
BILIRUBIN URINE: NEGATIVE
Glucose, UA: 50 mg/dL — AB
Ketones, ur: NEGATIVE mg/dL
Leukocytes, UA: NEGATIVE
Nitrite: NEGATIVE
Protein, ur: NEGATIVE mg/dL
Specific Gravity, Urine: 1.008 (ref 1.005–1.030)
pH: 6 (ref 5.0–8.0)

## 2018-02-25 LAB — CBC
HCT: 39.5 % (ref 36.0–46.0)
Hemoglobin: 12.5 g/dL (ref 12.0–15.0)
MCH: 30.3 pg (ref 26.0–34.0)
MCHC: 31.6 g/dL (ref 30.0–36.0)
MCV: 95.6 fL (ref 80.0–100.0)
NRBC: 0 % (ref 0.0–0.2)
Platelets: 271 10*3/uL (ref 150–400)
RBC: 4.13 MIL/uL (ref 3.87–5.11)
RDW: 12.5 % (ref 11.5–15.5)
WBC: 13.5 10*3/uL — ABNORMAL HIGH (ref 4.0–10.5)

## 2018-02-25 LAB — CBC WITH DIFFERENTIAL/PLATELET
Abs Immature Granulocytes: 0.1 10*3/uL — ABNORMAL HIGH (ref 0.00–0.07)
BASOS PCT: 1 %
Basophils Absolute: 0.1 10*3/uL (ref 0.0–0.1)
Eosinophils Absolute: 0.3 10*3/uL (ref 0.0–0.5)
Eosinophils Relative: 2 %
HCT: 39.1 % (ref 36.0–46.0)
Hemoglobin: 12.7 g/dL (ref 12.0–15.0)
Immature Granulocytes: 1 %
Lymphocytes Relative: 23 %
Lymphs Abs: 2.9 10*3/uL (ref 0.7–4.0)
MCH: 30.2 pg (ref 26.0–34.0)
MCHC: 32.5 g/dL (ref 30.0–36.0)
MCV: 92.9 fL (ref 80.0–100.0)
MONOS PCT: 10 %
Monocytes Absolute: 1.2 10*3/uL — ABNORMAL HIGH (ref 0.1–1.0)
Neutro Abs: 8 10*3/uL — ABNORMAL HIGH (ref 1.7–7.7)
Neutrophils Relative %: 63 %
PLATELETS: 265 10*3/uL (ref 150–400)
RBC: 4.21 MIL/uL (ref 3.87–5.11)
RDW: 12.7 % (ref 11.5–15.5)
WBC: 12.5 10*3/uL — ABNORMAL HIGH (ref 4.0–10.5)
nRBC: 0 % (ref 0.0–0.2)

## 2018-02-25 LAB — COMPREHENSIVE METABOLIC PANEL
ALT: 18 U/L (ref 0–44)
AST: 27 U/L (ref 15–41)
Albumin: 3.8 g/dL (ref 3.5–5.0)
Alkaline Phosphatase: 105 U/L (ref 38–126)
Anion gap: 10 (ref 5–15)
BUN: 15 mg/dL (ref 8–23)
CO2: 26 mmol/L (ref 22–32)
Calcium: 8.9 mg/dL (ref 8.9–10.3)
Chloride: 98 mmol/L (ref 98–111)
Creatinine, Ser: 0.9 mg/dL (ref 0.44–1.00)
GFR calc Af Amer: >60 mL/min (ref >60)
GFR calc non Af Amer: >60 mL/min (ref >60)
Glucose, Bld: 234 mg/dL — ABNORMAL HIGH (ref 70–99)
Potassium: 4.6 mmol/L (ref 3.5–5.1)
Sodium: 134 mmol/L — ABNORMAL LOW (ref 135–145)
Total Bilirubin: 0.9 mg/dL (ref 0.3–1.2)
Total Protein: 7.5 g/dL (ref 6.5–8.1)

## 2018-02-25 LAB — CREATININE, SERUM
Creatinine, Ser: 1.19 mg/dL — ABNORMAL HIGH (ref 0.44–1.00)
GFR calc Af Amer: 55 mL/min — ABNORMAL LOW (ref >60)
GFR calc non Af Amer: 48 mL/min — ABNORMAL LOW (ref >60)

## 2018-02-25 LAB — INFLUENZA PANEL BY PCR (TYPE A & B)
INFLBPCR: NEGATIVE
Influenza A By PCR: NEGATIVE

## 2018-02-25 LAB — LIPASE, BLOOD: LIPASE: 110 U/L — AB (ref 11–51)

## 2018-02-25 LAB — GLUCOSE, CAPILLARY: Glucose-Capillary: 333 mg/dL — ABNORMAL HIGH (ref 70–99)

## 2018-02-25 MED ORDER — ONDANSETRON HCL 4 MG/2ML IJ SOLN: 4.0000 mg | Freq: Four times a day (QID) | INTRAMUSCULAR | Status: DC | PRN

## 2018-02-25 MED ORDER — IPRATROPIUM-ALBUTEROL 0.5-2.5 (3) MG/3ML IN SOLN
3.0000 mL | Freq: Once | RESPIRATORY_TRACT | Status: AC
  Administered 2018-02-25: 3 mL via RESPIRATORY_TRACT
  Filled 2018-02-25: qty 9

## 2018-02-25 MED ORDER — ENOXAPARIN SODIUM 40 MG/0.4ML ~~LOC~~ SOLN
40.0000 mg | Freq: Two times a day (BID) | SUBCUTANEOUS | Status: DC
  Administered 2018-02-26 – 2018-03-02 (×10): 40 mg via SUBCUTANEOUS
  Filled 2018-02-25 (×10): qty 0.4

## 2018-02-25 MED ORDER — ACETAMINOPHEN 325 MG PO TABS
650.0000 mg | ORAL_TABLET | Freq: Four times a day (QID) | ORAL | Status: DC | PRN
  Administered 2018-02-27 (×2): 650 mg via ORAL
  Filled 2018-02-25 (×3): qty 2

## 2018-02-25 MED ORDER — DOXYCYCLINE HYCLATE 100 MG PO TABS
100.0000 mg | ORAL_TABLET | Freq: Two times a day (BID) | ORAL | Status: DC
  Administered 2018-02-25 – 2018-03-02 (×10): 100 mg via ORAL
  Filled 2018-02-25 (×10): qty 1

## 2018-02-25 MED ORDER — SODIUM CHLORIDE 0.9% FLUSH: 3.0000 mL | INTRAVENOUS | Status: DC | PRN

## 2018-02-25 MED ORDER — ACETAMINOPHEN 650 MG RE SUPP: 650.0000 mg | Freq: Four times a day (QID) | RECTAL | Status: DC | PRN

## 2018-02-25 MED ORDER — IPRATROPIUM-ALBUTEROL 0.5-2.5 (3) MG/3ML IN SOLN
3.0000 mL | Freq: Once | RESPIRATORY_TRACT | Status: AC
  Administered 2018-02-25: 3 mL via RESPIRATORY_TRACT

## 2018-02-25 MED ORDER — ONDANSETRON HCL 4 MG PO TABS: 4.0000 mg | ORAL_TABLET | Freq: Four times a day (QID) | ORAL | Status: DC | PRN

## 2018-02-25 MED ORDER — INSULIN ASPART 100 UNIT/ML ~~LOC~~ SOLN
0.0000 [IU] | Freq: Three times a day (TID) | SUBCUTANEOUS | Status: DC
  Administered 2018-02-26 (×2): 9 [IU] via SUBCUTANEOUS
  Filled 2018-02-25 (×2): qty 1

## 2018-02-25 MED ORDER — ALBUTEROL SULFATE (2.5 MG/3ML) 0.083% IN NEBU
10.0000 mg | INHALATION_SOLUTION | Freq: Once | RESPIRATORY_TRACT | Status: AC
  Administered 2018-02-25: 10 mg via RESPIRATORY_TRACT
  Filled 2018-02-25: qty 12

## 2018-02-25 MED ORDER — MAGNESIUM SULFATE 2 GM/50ML IV SOLN
2.0000 g | Freq: Once | INTRAVENOUS | Status: AC
  Administered 2018-02-25: 2 g via INTRAVENOUS
  Filled 2018-02-25: qty 50

## 2018-02-25 MED ORDER — IOHEXOL 300 MG/ML  SOLN: 75.0000 mL | Freq: Once | INTRAMUSCULAR | Status: DC | PRN

## 2018-02-25 MED ORDER — IOHEXOL 300 MG/ML  SOLN
100.0000 mL | Freq: Once | INTRAMUSCULAR | Status: AC | PRN
  Administered 2018-02-25: 100 mL via INTRAVENOUS

## 2018-02-25 MED ORDER — BUDESONIDE 0.25 MG/2ML IN SUSP
0.2500 mg | Freq: Two times a day (BID) | RESPIRATORY_TRACT | Status: DC
  Administered 2018-02-26 – 2018-03-01 (×7): 0.25 mg via RESPIRATORY_TRACT
  Filled 2018-02-25 (×9): qty 2

## 2018-02-25 MED ORDER — GUAIFENESIN-DM 100-10 MG/5ML PO SYRP
5.0000 mL | ORAL_SOLUTION | ORAL | Status: DC | PRN
  Administered 2018-02-27 – 2018-03-02 (×3): 5 mL via ORAL
  Filled 2018-02-25 (×3): qty 5

## 2018-02-25 MED ORDER — IPRATROPIUM-ALBUTEROL 0.5-2.5 (3) MG/3ML IN SOLN
3.0000 mL | RESPIRATORY_TRACT | Status: DC
  Administered 2018-02-25 – 2018-02-28 (×15): 3 mL via RESPIRATORY_TRACT
  Filled 2018-02-25 (×13): qty 3

## 2018-02-25 MED ORDER — GUAIFENESIN ER 600 MG PO TB12
600.0000 mg | ORAL_TABLET | Freq: Two times a day (BID) | ORAL | Status: DC
  Administered 2018-02-26 – 2018-03-02 (×10): 600 mg via ORAL
  Filled 2018-02-25 (×10): qty 1

## 2018-02-25 MED ORDER — METHYLPREDNISOLONE SODIUM SUCC 125 MG IJ SOLR
60.0000 mg | Freq: Four times a day (QID) | INTRAMUSCULAR | Status: DC
  Administered 2018-02-25 – 2018-02-26 (×2): 60 mg via INTRAVENOUS
  Filled 2018-02-25 (×3): qty 2

## 2018-02-25 MED ORDER — AZITHROMYCIN 500 MG PO TABS
500.0000 mg | ORAL_TABLET | Freq: Once | ORAL | Status: AC
  Administered 2018-02-25: 500 mg via ORAL
  Filled 2018-02-25: qty 1

## 2018-02-25 MED ORDER — SODIUM CHLORIDE 0.9 % IV SOLN
1.0000 g | Freq: Once | INTRAVENOUS | Status: AC
  Administered 2018-02-25: 1 g via INTRAVENOUS
  Filled 2018-02-25: qty 10

## 2018-02-25 MED ORDER — SODIUM CHLORIDE 0.9% FLUSH
3.0000 mL | Freq: Two times a day (BID) | INTRAVENOUS | Status: DC
  Administered 2018-02-25 – 2018-03-02 (×9): 3 mL via INTRAVENOUS

## 2018-02-25 MED ORDER — SODIUM CHLORIDE 0.9 % IV SOLN: 250.0000 mL | INTRAVENOUS | Status: DC | PRN

## 2018-02-25 NOTE — Progress Notes (Signed)
Dr. Anne HahnWillis notified of right groin MASD and unidentified non-draining pustules; unsure if this is shingles; acknowledged; will come up and see patient and assess right groin. Patient's door kept closed; this writer wearing mask, until identified as not contagious. Windy Carinaurner,Kaelene Elliston K, RN 11:17 PM 02/25/2018

## 2018-02-25 NOTE — Progress Notes (Signed)
Anticoagulation monitoring(Lovenox):  67 yo female ordered Lovenox 40 mg Q24h  Filed Weights   02/25/18 1836  Weight: 279 lb 15.8 oz (127 kg)   BMI  51  Lab Results  Component Value Date   CREATININE 0.90 02/25/2018   CREATININE 1.22 (H) 12/13/2016   CREATININE 1.56 (H) 11/08/2016   Estimated Creatinine Clearance: 78.5 mL/min (by C-G formula based on SCr of 0.9 mg/dL). Hemoglobin & Hematocrit     Component Value Date/Time   HGB 12.7 02/25/2018 1838   HGB 13.1 10/01/2013 2247   HCT 39.1 02/25/2018 1838   HCT 39.8 10/01/2013 2247     Per Protocol for Patient with estCrcl > 30 ml/min and BMI > 40, will transition to Lovenox 40 mg Q12h.

## 2018-02-25 NOTE — Progress Notes (Signed)
Advanced care plan.  Purpose of the Encounter: CODE STATUS  Parties in Attendance: Patient herself  Patient's Decision Capacity: Intact  Subjective/Patient's story:  Patient 67 year old with history of asthma, was hypertension, diabetes presenting with shortness of breath  Objective/Medical story I discussed with the patient regarding her desires for cardiac and pulmonary resuscitation Explained her what cardiac and pulmonary resuscitation will  Goals of care determination: Patient wants to be a full code   CODE STATUS: Full code   Time spent discussing advanced care planning: 16 minutes

## 2018-02-25 NOTE — ED Notes (Signed)
Pt assisted to bedside commode. Pt able to ambulate to toilet with minimal assistance. Pt assisted back to bed. Pt in NAD at this time. This RN will continue to monitor.

## 2018-02-25 NOTE — ED Triage Notes (Signed)
Pt presents to ED via [facility] with c/o cough for one week and shortness of breath. Pt reports she was on her way to the hospital when she got dizzy walking out of the door and her husband called EMS. 89% on RA for EMS. EMS gave pt 2 albuterol tx in route and 125mg  solumedrol. Blood sugar 227 for EMS. Pt also c/o abdominal pain in the center of her stomach at this time.

## 2018-02-25 NOTE — ED Provider Notes (Signed)
[facility] Emergency Department Provider Note  ____________________________________________  Time seen: Approximately 6:51 PM  I have reviewed the triage vital signs and the nursing notes.   HISTORY  Chief Complaint Shortness of Breath and Abdominal Pain   HPI Wendy Summers is a 67 y.o. female with a history of asthma, bronchitis, diabetes who presents for evaluation of shortness of breath.  Patient reports 1 week of dry cough and progressively worsening shortness of breath.  Has had wheezing.  She reports that when she coughs she has pain in her epigastric region however that resolves when she is not coughing.  She denies chest pain.  She denies hemoptysis, leg swelling, prior history of PE or DVT.  She reports an episode of subjective fever earlier today.  No vomiting or diarrhea.  Patient not on oxygen at baseline.  This afternoon she was on her way to bring her husband to the hospital for his appointment when she started feeling dizzy.  When 911 arrived patient was hypoxic with pulse ox of 89%.  She received 2 albuterol's and Solu-Medrol per EMS.  Patient arrives hypoxic requiring 2 L nasal cannula.   Past Medical History:  Diagnosis Date  . Asthma   . Bronchitis   . Diabetes mellitus without complication (HCC)   . Migraine     Patient Active Problem List   Diagnosis Date Noted  . Asthmatic bronchitis 07/14/2016  . Near syncope 07/13/2016    Past Surgical History:  Procedure Laterality Date  . CHOLECYSTECTOMY      Prior to Admission medications   Medication Sig Start Date End Date Taking? Authorizing Provider  albuterol (PROVENTIL) (2.5 MG/3ML) 0.083% nebulizer solution Take 3 mLs (2.5 mg total) by nebulization every 4 (four) hours. 07/18/16   Enid BaasKalisetti, Radhika, MD  benzonatate (TESSALON) 200 MG capsule Take 1 capsule (200 mg total) by mouth 3 (three) times daily. Patient not taking: Reported on 11/08/2016 07/18/16   Enid BaasKalisetti, Radhika, MD    carvedilol (COREG) 6.25 MG tablet Take 6.25 mg by mouth 2 (two) times daily with a meal.    [provider]  chlorpheniramine-HYDROcodone (TUSSIONEX) 10-8 MG/5ML SUER Take 5 mLs by mouth every 12 (twelve) hours. Patient not taking: Reported on 11/08/2016 07/18/16   Enid BaasKalisetti, Radhika, MD  Fluticasone-Salmeterol (ADVAIR DISKUS) 100-50 MCG/DOSE AEPB Inhale 1 puff into the lungs 2 (two) times daily. 07/18/16 07/18/17  Enid BaasKalisetti, Radhika, MD  glipiZIDE (GLUCOTROL) 10 MG tablet Take 10 mg by mouth daily before breakfast.    [provider]  latanoprost (XALATAN) 0.005 % ophthalmic solution Place 1 drop into both eyes at bedtime.    [provider]  lisinopril (PRINIVIL,ZESTRIL) 20 MG tablet Take 20 mg by mouth daily. for high blood pressure 03/07/17   [provider]  lisinopril-hydrochlorothiazide (PRINZIDE,ZESTORETIC) 20-25 MG tablet Take 1 tablet by mouth daily.    [provider]  metFORMIN (GLUCOPHAGE) 1000 MG tablet Take 2,000 mg by mouth daily after supper.     [provider]  oxyCODONE-acetaminophen (PERCOCET) 5-325 MG tablet Take 1-2 tablets by mouth every 8 (eight) hours as needed. Patient not taking: Reported on 04/11/2017 12/16/16   Emily FilbertWilliams, Jonathan E, MD  predniSONE (STERAPRED UNI-PAK 21 TAB) 10 MG (21) TBPK tablet 6 tabs PO x 1 day 5 tabs PO x 2 days 4 tabs PO x 2 days 3 tabs PO x 2 days 2 tabs PO x 2 days 1 tab PO x 2 days and stop Patient not taking: Reported on  11/08/2016 07/18/16   Enid BaasKalisetti, Radhika, MD  Probiotic Product (PROBIOTIC DAILY) CAPS Take 2 capsules by mouth every morning. Patient not taking: Reported on 11/08/2016 09/16/16   Emily FilbertWilliams, Jonathan E, MD  tiotropium [facility](SPIRIVA) 18 MCG inhalation capsule Place 1 capsule (18 mcg total) into inhaler and inhale daily. 07/19/16   Enid BaasKalisetti, Radhika, MD    Allergies Patient has no known allergies.  History reviewed. No pertinent family history.  Social History Social History    Tobacco Use  . Smoking status: Never Smoker  . Smokeless tobacco: Never Used  Substance Use Topics  . Alcohol use: No  . Drug use: No    Review of Systems  Constitutional: + subjective fever. Eyes: Negative for visual changes. ENT: Negative for sore throat. Neck: No neck pain  Cardiovascular: Negative for chest pain. Respiratory: + shortness of breath, cough, wheezing Gastrointestinal: Negative for abdominal pain, vomiting or diarrhea. Genitourinary: Negative for dysuria. Musculoskeletal: Negative for back pain. Skin: Negative for rash. Neurological: Negative for headaches, weakness or numbness. Psych: No SI or HI  ____________________________________________   PHYSICAL EXAM:  VITAL SIGNS: ED Triage Vitals  Enc Vitals Group     BP 02/25/18 1837 (!) 157/67     Pulse Rate 02/25/18 1837 (!) 103     Resp 02/25/18 1837 20     Temp 02/25/18 1835 98.7 F (37.1 C)     Temp Source 02/25/18 1835 Oral     SpO2 02/25/18 1837 93 %     Weight 02/25/18 1836 279 lb 15.8 oz (127 kg)     Height 02/25/18 1835 5\' 2"  (1.575 m)     Head Circumference --      Peak Flow --      Pain Score 02/25/18 1835 10     Pain Loc --      Pain Edu? --      Excl. in GC? --     Constitutional: Alert and oriented, moderate increased work of breathing.  HEENT:      Head: Normocephalic and atraumatic.         Eyes: Conjunctivae are normal. Sclera is non-icteric.       Mouth/Throat: Mucous membranes are moist.       Neck: Supple with no signs of meningismus. Cardiovascular: Tachycardic with regular rhythm. No murmurs, gallops, or rubs. 2+ symmetrical distal pulses are present in all extremities. No JVD. Respiratory: Increased work of breathing, hypoxic, diffuse expiratory wheezes with reduced air movement bilaterally gastrointestinal: Soft, non tender, and non distended with positive bowel sounds. No rebound or guarding. Musculoskeletal: Nontender with normal range of motion in all extremities. No  edema, cyanosis, or erythema of extremities. Neurologic: Normal speech and language. Face is symmetric. Moving all extremities. No gross focal neurologic deficits are appreciated. Skin: Skin is warm, dry and intact. No rash noted. Psychiatric: Mood and affect are normal. Speech and behavior are normal.  ____________________________________________   LABS (all labs ordered are listed, but only abnormal results are displayed)  Labs Reviewed  LIPASE, BLOOD - Abnormal; Notable for the following components:      Result Value   Lipase 110 (*)    All other components within normal limits  COMPREHENSIVE METABOLIC PANEL - Abnormal; Notable for the following components:   Sodium 134 (*)    Glucose, Bld 234 (*)    All other components within normal limits  CBC WITH DIFFERENTIAL/PLATELET - Abnormal; Notable for the following components:   WBC 12.5 (*)    Neutro Abs 8.0 (*)  Monocytes Absolute 1.2 (*)    Abs Immature Granulocytes 0.10 (*)    All other components within normal limits  INFLUENZA PANEL BY PCR (TYPE A & B)  URINALYSIS, COMPLETE (UACMP) WITH MICROSCOPIC   ____________________________________________  EKG  ED ECG REPORT I, Nita Sickle, the attending physician, personally viewed and interpreted this ECG.   Sinus tachycardia, rate of 105, normal intervals, left axis deviation, T wave inversions in 1 and aVL, no ST elevations or depressions, occasional PVCs.  No significant changes when compared to prior. ____________________________________________  RADIOLOGY  I have personally reviewed the images performed during this visit and I agree with the Radiologist's read.   Interpretation by Radiologist:  Dg Chest 2 View  Result Date: 02/25/2018 CLINICAL DATA:  Cough, shortness of breath. EXAM: CHEST - 2 VIEW COMPARISON:  Radiographs of December 06, 2016. FINDINGS: Stable cardiomegaly. No pneumothorax or pleural effusion is noted. Both lungs are clear. The visualized  skeletal structures are unremarkable. IMPRESSION: No active cardiopulmonary disease. Electronically Signed   By: Lupita Raider, M.D.   On: 02/25/2018 19:28      ____________________________________________   PROCEDURES  Procedure(s) performed: None Procedures Critical Care performed: yes  CRITICAL CARE Performed by: Nita Sickle  ?  Total critical care time: 40 min  Critical care time was exclusive of separately billable procedures and treating other patients.  Critical care was necessary to treat or prevent imminent or life-threatening deterioration.  Critical care was time spent personally by me on the following activities: development of treatment plan with patient and/or surrogate as well as nursing, discussions with consultants, evaluation of patient's response to treatment, examination of patient, obtaining history from patient or surrogate, ordering and performing treatments and interventions, ordering and review of laboratory studies, ordering and review of radiographic studies, pulse oximetry and re-evaluation of patient's condition.  ____________________________________________   INITIAL IMPRESSION / ASSESSMENT AND PLAN / ED COURSE   67 y.o. female with a history of asthma, bronchitis, diabetes who presents for evaluation of shortness of breath, cough, wheezing.  Patient hypoxic with new oxygen requirement, diffuse expiratory wheezes.  Patient is tachycardic but afebrile.  EKG with no changes. CXR pending to evaluate for PNA. Flu pending.  Given Solu-Medrol per EMS.  Will start patient on DuoNeb x3 and magnesium.  Presentation concerning for pneumonia versus flu versus viral URI versus bronchitis versus asthma exacerbation.  _________________________ 8:08 PM on 02/25/2018 -----------------------------------------  CXR negative for PNA but with h/o fever at home and elevated WBC will cover for CAPNA. Flu negative. Patient continues with new O2 requirement after 2  albuterol per EMS and 3 duonebs, mag and prednisone. Will start 10mg  per hour and admit to Hospitalist.  Also complaining of epigastric abdominal pain only when she coughs, she has no tenderness on exam. Labs showing elevated lipase, patient's sister has a history of pancreatic cancer, Will send for CT a/p.        As part of my medical decision making, I reviewed the following data within the electronic MEDICAL RECORD NUMBER Nursing notes reviewed and incorporated, Labs reviewed , EKG interpreted , Old EKG reviewed, Old chart reviewed, Radiograph reviewed , Discussed with admitting physician , Notes from prior ED visits and [place] Controlled Substance Database    Pertinent labs & imaging results that were available during my care of the patient were reviewed by me and considered in my medical decision making (see chart for details).    ____________________________________________   FINAL CLINICAL IMPRESSION(S) / ED DIAGNOSES  Final diagnoses:  Acute respiratory failure with hypoxia (HCC)  Exacerbation of asthma, unspecified asthma severity, unspecified whether persistent  Acute pancreatitis, unspecified complication status, unspecified pancreatitis type      NEW MEDICATIONS STARTED DURING THIS VISIT:  ED Discharge Orders    None       Note:  This document was prepared using Dragon voice recognition software and may include unintentional dictation errors.    Nita Sickle, MD 02/25/18 2011

## 2018-02-25 NOTE — H&P (Signed)
[employer] - [place] [facility]   PATIENT NAME: Wendy Summers    MR#:  [id number]  DATE OF BIRTH:  14-Jan-1951  DATE OF ADMISSION:  02/25/2018  PRIMARY CARE PHYSICIAN: [facility]   REQUESTING/REFERRING PHYSICIAN: Nita Sickle, MD CHIEF COMPLAINT:   Chief Complaint  Patient presents with  . Shortness of Breath  . Abdominal Pain    HISTORY OF PRESENT ILLNESS: Wendy Summers  is a 67 y.o. female with a known history of asthma, bronchitis, diabetes and migraine who is presenting to the emergency room with complaint of shortness of breath ongoing for 1 week has gotten progressively worse.  Patient states that she has been having more wheezing and coughing.  Is now requiring oxygen in the emergency room.  She states that her cough is greenish in color.  She does have some fevers.  She states that she has been having epigastric pain with coughing.  In the emergency room she was noted to have a lipase that was mildly elevated. PAST MEDICAL HISTORY:   Past Medical History:  Diagnosis Date  . Asthma   . Bronchitis   . Diabetes mellitus without complication (HCC)   . Migraine     PAST SURGICAL HISTORY:  Past Surgical History:  Procedure Laterality Date  . CHOLECYSTECTOMY      SOCIAL HISTORY:  Social History   Tobacco Use  . Smoking status: Never Smoker  . Smokeless tobacco: Never Used  Substance Use Topics  . Alcohol use: No    FAMILY HISTORY:  Family History  Problem Relation Age of Onset  . Pancreatic cancer Sister     DRUG ALLERGIES: No Known Allergies  REVIEW OF SYSTEMS:   CONSTITUTIONAL: No fever, fatigue or weakness.  EYES: No blurred or double vision.  EARS, NOSE, AND THROAT: No tinnitus or ear pain.  RESPIRATORY: Positive cough, positive shortness of breath, positive wheezing or hemoptysis.  CARDIOVASCULAR: No chest pain, orthopnea, edema.  GASTROINTESTINAL: No nausea, vomiting, diarrhea or positive abdominal pain with  cough gENITOURINARY: No dysuria, hematuria.  ENDOCRINE: No polyuria, nocturia,  HEMATOLOGY: No anemia, easy bruising or bleeding SKIN: No rash or lesion. MUSCULOSKELETAL: No joint pain or arthritis.   NEUROLOGIC: No tingling, numbness, weakness.  PSYCHIATRY: No anxiety or depression.   MEDICATIONS AT HOME: Patient's med reconciliation is not done Prior to Admission medications   Medication Sig Start Date End Date Taking? Authorizing Provider  albuterol (PROVENTIL) (2.5 MG/3ML) 0.083% nebulizer solution Take 3 mLs (2.5 mg total) by nebulization every 4 (four) hours. 07/18/16   Enid Baas, MD  benzonatate (TESSALON) 200 MG capsule Take 1 capsule (200 mg total) by mouth 3 (three) times daily. Patient not taking: Reported on 11/08/2016 07/18/16   Enid Baas, MD  carvedilol (COREG) 6.25 MG tablet Take 6.25 mg by mouth 2 (two) times daily with a meal.    [provider]  chlorpheniramine-HYDROcodone (TUSSIONEX) 10-8 MG/5ML SUER Take 5 mLs by mouth every 12 (twelve) hours. Patient not taking: Reported on 11/08/2016 07/18/16   Enid Baas, MD  Fluticasone-Salmeterol (ADVAIR DISKUS) 100-50 MCG/DOSE AEPB Inhale 1 puff into the lungs 2 (two) times daily. 07/18/16 07/18/17  Enid Baas, MD  glipiZIDE (GLUCOTROL) 10 MG tablet Take 10 mg by mouth daily before breakfast.    [provider]  latanoprost (XALATAN) 0.005 % ophthalmic solution Place 1 drop into both eyes at bedtime.    [provider]  lisinopril (PRINIVIL,ZESTRIL) 20 MG tablet Take 20 mg by mouth daily. for  high blood pressure 03/07/17   [provider]  lisinopril-hydrochlorothiazide (PRINZIDE,ZESTORETIC) 20-25 MG tablet Take 1 tablet by mouth daily.    [provider]  metFORMIN (GLUCOPHAGE) 1000 MG tablet Take 2,000 mg by mouth daily after supper.     [provider]  oxyCODONE-acetaminophen (PERCOCET) 5-325 MG tablet Take 1-2 tablets by mouth every 8 (eight) hours as  needed. Patient not taking: Reported on 04/11/2017 12/16/16   Emily Filbert, MD  predniSONE (STERAPRED UNI-PAK 21 TAB) 10 MG (21) TBPK tablet 6 tabs PO x 1 day 5 tabs PO x 2 days 4 tabs PO x 2 days 3 tabs PO x 2 days 2 tabs PO x 2 days 1 tab PO x 2 days and stop Patient not taking: Reported on 11/08/2016 07/18/16   Enid Baas, MD  Probiotic Product (PROBIOTIC DAILY) CAPS Take 2 capsules by mouth every morning. Patient not taking: Reported on 11/08/2016 09/16/16   Emily Filbert, MD  tiotropium [facility]) 18 MCG inhalation capsule Place 1 capsule (18 mcg total) into inhaler and inhale daily. 07/19/16   Enid Baas, MD      PHYSICAL EXAMINATION:   VITAL SIGNS: Blood pressure (!) 165/76, pulse (!) 109, temperature 98.7 F (37.1 C), temperature source Oral, resp. rate (!) 21, height 5\' 2"  (1.575 m), weight 127 kg, SpO2 93 %.  GENERAL:  67 y.o.-year-old patient lying in the bed with no acute distress.  EYES: Pupils equal, round, reactive to light and accommodation. No scleral icterus. Extraocular muscles intact.  HEENT: Head atraumatic, normocephalic. Oropharynx and nasopharynx clear.  NECK:  Supple, no jugular venous distention. No thyroid enlargement, no tenderness.  LUNGS: Bilateral wheezing throughout both lung CARDIOVASCULAR: S1, S2 normal. No murmurs, rubs, or gallops.  ABDOMEN: Soft, nontender, nondistended. Bowel sounds present. No organomegaly or mass.  EXTREMITIES: No pedal edema, cyanosis, or clubbing.  NEUROLOGIC: Cranial nerves II through XII are intact. Muscle strength 5/5 in all extremities. Sensation intact. Gait not checked.  PSYCHIATRIC: The patient is alert and oriented x 3.  SKIN: No obvious rash, lesion, or ulcer.   LABORATORY PANEL:   CBC Recent Labs  Lab 02/25/18 1838  WBC 12.5*  HGB 12.7  HCT 39.1  PLT 265  MCV 92.9  MCH 30.2  MCHC 32.5  RDW 12.7  LYMPHSABS 2.9  MONOABS 1.2*  EOSABS 0.3  BASOSABS 0.1    ------------------------------------------------------------------------------------------------------------------  Chemistries  Recent Labs  Lab 02/25/18 1838  NA 134*  K 4.6  CL 98  CO2 26  GLUCOSE 234*  BUN 15  CREATININE 0.90  CALCIUM 8.9  AST 27  ALT 18  ALKPHOS 105  BILITOT 0.9   ------------------------------------------------------------------------------------------------------------------ estimated creatinine clearance is 78.5 mL/min (by C-G formula based on SCr of 0.9 mg/dL). ------------------------------------------------------------------------------------------------------------------ No results for input(s): TSH, T4TOTAL, T3FREE, THYROIDAB in the last 72 hours.  Invalid input(s): FREET3   Coagulation profile No results for input(s): INR, PROTIME in the last 168 hours. ------------------------------------------------------------------------------------------------------------------- No results for input(s): DDIMER in the last 72 hours. -------------------------------------------------------------------------------------------------------------------  Cardiac Enzymes No results for input(s): CKMB, TROPONINI, MYOGLOBIN in the last 168 hours.  Invalid input(s): CK ------------------------------------------------------------------------------------------------------------------ Invalid input(s): POCBNP  ---------------------------------------------------------------------------------------------------------------  Urinalysis    Component Value Date/Time   COLORURINE STRAW (A) 02/25/2018 1837   APPEARANCEUR CLEAR (A) 02/25/2018 1837   APPEARANCEUR Cloudy 10/02/2013 0058   LABSPEC 1.008 02/25/2018 1837   LABSPEC 1.029 10/02/2013 0058   PHURINE 6.0 02/25/2018 1837   GLUCOSEU 50 (A) 02/25/2018 1837   GLUCOSEU Negative  10/02/2013 0058   HGBUR SMALL (A) 02/25/2018 1837   BILIRUBINUR NEGATIVE 02/25/2018 1837   BILIRUBINUR Negative 10/02/2013 0058    KETONESUR NEGATIVE 02/25/2018 1837   PROTEINUR NEGATIVE 02/25/2018 1837   NITRITE NEGATIVE 02/25/2018 1837   LEUKOCYTESUR NEGATIVE 02/25/2018 1837   LEUKOCYTESUR 2+ 10/02/2013 0058     RADIOLOGY: Dg Chest 2 View  Result Date: 02/25/2018 CLINICAL DATA:  Cough, shortness of breath. EXAM: CHEST - 2 VIEW COMPARISON:  Radiographs of December 06, 2016. FINDINGS: Stable cardiomegaly. No pneumothorax or pleural effusion is noted. Both lungs are clear. The visualized skeletal structures are unremarkable. IMPRESSION: No active cardiopulmonary disease. Electronically Signed   By: Lupita RaiderJames  Green Jr, M.D.   On: 02/25/2018 19:28   Ct Abdomen Pelvis W Contrast  Result Date: 02/25/2018 CLINICAL DATA:  67 year old who presented with hypoxemia, found on laboratory evaluation to have an elevated lipase. EXAM: CT ABDOMEN AND PELVIS WITH CONTRAST TECHNIQUE: Multidetector CT imaging of the abdomen and pelvis was performed using the standard protocol following bolus administration of intravenous contrast. CONTRAST:  100mL OMNIPAQUE IOHEXOL 300 MG/ML IV. COMPARISON:  11/14/2011 and earlier. FINDINGS: Lower chest: Heart size normal.  Visualized lung bases clear. Hepatobiliary: Geographic areas of hepatic steatosis involving the RIGHT lobe. No hepatic parenchymal masses. Surgically absent gallbladder. No unexpected biliary ductal dilation. Pancreas: Normal in appearance without evidence of mass, ductal dilation, or inflammation currently. Spleen: Normal in size and appearance. Adrenals/Urinary Tract: Normal appearing adrenal glands. Incompletely rotated RIGHT kidney as noted previously. Kidneys normal in size and appearance without focal parenchymal abnormality. No hydronephrosis. No evidence of urinary tract calculi. Normal appearing urinary bladder. Stomach/Bowel: Stomach normal in appearance for the degree of distention. Normal-appearing small bowel. Normal appearing decompressed colon. Normal appearing decompressed appendix  in the RIGHT UPPER pelvis. Vascular/Lymphatic: Mild abdominal aortic atherosclerosis without evidence of aneurysm. Normal-appearing portal venous and systemic venous systems. No pathologic lymphadenopathy. Reproductive: Small calcified, degenerated uterine fibroid. Uterus otherwise normal in appearance for patient age. No adnexal masses. Other: None. Musculoskeletal: Degenerative disc disease at L4-5. Spondylosis involving the LOWER thoracic spine. Facet degenerative changes diffusely. No acute findings. IMPRESSION: 1. No acute abnormalities involving the abdomen or pelvis. Specifically, no CT evidence for acute pancreatitis currently. 2. Geographic areas of focal hepatic steatosis involving the RIGHT lobe of the liver. Aortic Atherosclerosis (ICD10-I70.0). Electronically Signed   By: Hulan Saashomas  Lawrence M.D.   On: 02/25/2018 21:13    EKG: Orders placed or performed during the hospital encounter of 02/25/18  . ED EKG  . ED EKG    IMPRESSION AND PLAN: Patient is a 67 year old white female with history of asthma presenting with shortness of breath  1.  Acute asthma exacerbation We will treat with oxygen. We will treat with duo nebs, Pulmicort and Solu-Medrol  2.  Acute bronchitis treat with doxycycline  3.  Abdominal pain with minimally elevated lipase ER physician ordered a CT scan of the abdomen which is currently pending.  We will repeat lipase in the morning continue regular diet  4.  Diabetes type 2 Place on sliding scale insulin for now patient's med needs to be reconciliate elated  5.  Hypertension resume antihypertensives when medication reconciliation is done  6.  Miscellaneous Lovenox for DVT prophylax   All the records are reviewed and case discussed with ED provider. Management plans discussed with the patient, family and they are in agreement.  CODE STATUS: Code Status History    Date Active Date Inactive Code Status Order ID Comments User  Context   07/13/2016 0651 07/18/2016  1555 Full Code [id number]  Ihor AustinPyreddy, Pavan, MD Inpatient       TOTAL TIME TAKING CARE OF THIS PATIENT: 55minutes.    Auburn BilberryShreyang Trinton Prewitt M.D on 02/25/2018 at 9:15 PM  Between 7am to 6pm - Pager - [phone]  After 6pm go to [email or web address] - password [email or web address]  [employer] Office  [phone]  CC: Primary care physician; [facility]

## 2018-02-25 NOTE — ED Notes (Signed)
Pt oxygen saturation 88% on room air. Pt placed on 2L nasal cannula. Pt now saturating at 92%. MD Don PerkingVeronese aware. This RN will continue to monitor.

## 2018-02-26 LAB — BASIC METABOLIC PANEL
Anion gap: 14 (ref 5–15)
BUN: 20 mg/dL (ref 8–23)
CO2: 23 mmol/L (ref 22–32)
Calcium: 8.8 mg/dL — ABNORMAL LOW (ref 8.9–10.3)
Chloride: 96 mmol/L — ABNORMAL LOW (ref 98–111)
Creatinine, Ser: 1.03 mg/dL — ABNORMAL HIGH (ref 0.44–1.00)
GFR calc Af Amer: >60 mL/min (ref >60)
GFR, EST NON AFRICAN AMERICAN: 57 mL/min — AB (ref >60)
Glucose, Bld: 461 mg/dL — ABNORMAL HIGH (ref 70–99)
Potassium: 4.4 mmol/L (ref 3.5–5.1)
Sodium: 133 mmol/L — ABNORMAL LOW (ref 135–145)

## 2018-02-26 LAB — CBC
HCT: 39 % (ref 36.0–46.0)
Hemoglobin: 12.4 g/dL (ref 12.0–15.0)
MCH: 30 pg (ref 26.0–34.0)
MCHC: 31.8 g/dL (ref 30.0–36.0)
MCV: 94.4 fL (ref 80.0–100.0)
PLATELETS: 283 10*3/uL (ref 150–400)
RBC: 4.13 MIL/uL (ref 3.87–5.11)
RDW: 12.4 % (ref 11.5–15.5)
WBC: 12.7 10*3/uL — ABNORMAL HIGH (ref 4.0–10.5)
nRBC: 0 % (ref 0.0–0.2)

## 2018-02-26 LAB — HEMOGLOBIN A1C
Hgb A1c MFr Bld: 7.7 % — ABNORMAL HIGH (ref 4.8–5.6)
Mean Plasma Glucose: 174.29 mg/dL

## 2018-02-26 LAB — GLUCOSE, CAPILLARY
GLUCOSE-CAPILLARY: 408 mg/dL — AB (ref 70–99)
Glucose-Capillary: 441 mg/dL — ABNORMAL HIGH (ref 70–99)
Glucose-Capillary: 376 mg/dL — ABNORMAL HIGH (ref 70–99)
Glucose-Capillary: 336 mg/dL — ABNORMAL HIGH (ref 70–99)

## 2018-02-26 LAB — LIPASE, BLOOD: Lipase: 24 U/L (ref 11–51)

## 2018-02-26 MED ORDER — INSULIN GLARGINE 100 UNIT/ML ~~LOC~~ SOLN
20.0000 [IU] | Freq: Every day | SUBCUTANEOUS | Status: DC
  Administered 2018-02-26 – 2018-03-02 (×5): 20 [IU] via SUBCUTANEOUS
  Filled 2018-02-26 (×6): qty 0.2

## 2018-02-26 MED ORDER — PREDNISONE 50 MG PO TABS
50.0000 mg | ORAL_TABLET | Freq: Every day | ORAL | Status: DC
  Administered 2018-02-27 – 2018-03-01 (×3): 50 mg via ORAL
  Filled 2018-02-26 (×3): qty 1

## 2018-02-26 MED ORDER — VALACYCLOVIR HCL 500 MG PO TABS
1000.0000 mg | ORAL_TABLET | Freq: Three times a day (TID) | ORAL | Status: DC
  Administered 2018-02-26 – 2018-03-02 (×13): 1000 mg via ORAL
  Filled 2018-02-26 (×16): qty 2

## 2018-02-26 MED ORDER — INSULIN ASPART 100 UNIT/ML ~~LOC~~ SOLN: 0.0000 [IU] | Freq: Every day | SUBCUTANEOUS | Status: DC

## 2018-02-26 MED ORDER — INSULIN ASPART 100 UNIT/ML ~~LOC~~ SOLN
0.0000 [IU] | Freq: Three times a day (TID) | SUBCUTANEOUS | Status: DC
  Administered 2018-02-26: 20 [IU] via SUBCUTANEOUS
  Administered 2018-02-27: 11 [IU] via SUBCUTANEOUS
  Filled 2018-02-26 (×2): qty 1

## 2018-02-26 MED ORDER — ORAL CARE MOUTH RINSE
15.0000 mL | Freq: Two times a day (BID) | OROMUCOSAL | Status: DC
  Administered 2018-02-26 – 2018-03-02 (×7): 15 mL via OROMUCOSAL

## 2018-02-26 MED ORDER — GABAPENTIN 600 MG PO TABS
300.0000 mg | ORAL_TABLET | Freq: Two times a day (BID) | ORAL | Status: DC
  Administered 2018-02-26 – 2018-02-28 (×6): 300 mg via ORAL
  Filled 2018-02-26 (×6): qty 1

## 2018-02-26 NOTE — Progress Notes (Signed)
SOUND [facility] - [place] at [facility]   PATIENT NAME: Wendy Summers    MR#:  [id number]  DATE OF BIRTH:  Nov 08, 1950  SUBJECTIVE:  CHIEF COMPLAINT:   Chief Complaint  Patient presents with  . Shortness of Breath  . Abdominal Pain   Continues to have shortness of breath and cough.  Does not feel any improvement since presenting to the emergency room.  On 2 L oxygen. Right groin rash is painful  REVIEW OF SYSTEMS:    Review of Systems  Constitutional: Positive for malaise/fatigue. Negative for chills and fever.  HENT: Negative for sore throat.   Eyes: Negative for blurred vision, double vision and pain.  Respiratory: Positive for cough, shortness of breath and wheezing. Negative for hemoptysis.   Cardiovascular: Negative for chest pain, palpitations, orthopnea and leg swelling.  Gastrointestinal: Negative for abdominal pain, constipation, diarrhea, heartburn, nausea and vomiting.  Genitourinary: Negative for dysuria and hematuria.  Musculoskeletal: Negative for back pain and joint pain.  Skin: Positive for rash.  Neurological: Negative for sensory change, speech change, focal weakness and headaches.  Endo/Heme/Allergies: Does not bruise/bleed easily.  Psychiatric/Behavioral: Negative for depression. The patient is not nervous/anxious.     DRUG ALLERGIES:  No Known Allergies  VITALS:  Blood pressure (!) 169/72, pulse (!) 116, temperature 98.4 F (36.9 C), temperature source Oral, resp. rate 18, height 5\' 2"  (1.575 m), weight 127 kg, SpO2 91 %.  PHYSICAL EXAMINATION:   Physical Exam  GENERAL:  67 y.o.-year-old patient lying in the bed .  Obese EYES: Pupils equal, round, reactive to light and accommodation. No scleral icterus. Extraocular muscles intact.  HEENT: Head atraumatic, normocephalic. Oropharynx and nasopharynx clear.  NECK:  Supple, no jugular venous distention. No thyroid enlargement, no tenderness.  LUNGS: Increased work of breathing with bilateral  wheezing CARDIOVASCULAR: S1, S2 normal. No murmurs, rubs, or gallops.  ABDOMEN: Soft, nontender, nondistended. Bowel sounds present. No organomegaly or mass.  EXTREMITIES: No cyanosis, clubbing or edema b/l.    NEUROLOGIC: Cranial nerves II through XII are intact. No focal Motor or sensory deficits b/l.   PSYCHIATRIC: The patient is alert and oriented x 3.  SKIN: Vesiculopapular rash in the right groin and right lower quadrant of the abdomen area.  LABORATORY PANEL:   CBC Recent Labs  Lab 02/26/18 0424  WBC 12.7*  HGB 12.4  HCT 39.0  PLT 283   ------------------------------------------------------------------------------------------------------------------ Chemistries  Recent Labs  Lab 02/25/18 1838  02/26/18 0424  NA 134*  --  133*  K 4.6  --  4.4  CL 98  --  96*  CO2 26  --  23  GLUCOSE 234*  --  461*  BUN 15  --  20  CREATININE 0.90   < > 1.03*  CALCIUM 8.9  --  8.8*  AST 27  --   --   ALT 18  --   --   ALKPHOS 105  --   --   BILITOT 0.9  --   --    < > = values in this interval not displayed.   ------------------------------------------------------------------------------------------------------------------  Cardiac Enzymes No results for input(s): TROPONINI in the last 168 hours. ------------------------------------------------------------------------------------------------------------------  RADIOLOGY:  Dg Chest 2 View  Result Date: 02/25/2018 CLINICAL DATA:  Cough, shortness of breath. EXAM: CHEST - 2 VIEW COMPARISON:  Radiographs of December 06, 2016. FINDINGS: Stable cardiomegaly. No pneumothorax or pleural effusion is noted. Both lungs are clear. The visualized skeletal structures are unremarkable. IMPRESSION: No active cardiopulmonary disease.  Electronically Signed   By: Lupita RaiderJames  Green Jr, M.D.   On: 02/25/2018 19:28   Ct Abdomen Pelvis W Contrast  Result Date: 02/25/2018 CLINICAL DATA:  67 year old who presented with hypoxemia, found on laboratory  evaluation to have an elevated lipase. EXAM: CT ABDOMEN AND PELVIS WITH CONTRAST TECHNIQUE: Multidetector CT imaging of the abdomen and pelvis was performed using the standard protocol following bolus administration of intravenous contrast. CONTRAST:  100mL OMNIPAQUE IOHEXOL 300 MG/ML IV. COMPARISON:  11/14/2011 and earlier. FINDINGS: Lower chest: Heart size normal.  Visualized lung bases clear. Hepatobiliary: Geographic areas of hepatic steatosis involving the RIGHT lobe. No hepatic parenchymal masses. Surgically absent gallbladder. No unexpected biliary ductal dilation. Pancreas: Normal in appearance without evidence of mass, ductal dilation, or inflammation currently. Spleen: Normal in size and appearance. Adrenals/Urinary Tract: Normal appearing adrenal glands. Incompletely rotated RIGHT kidney as noted previously. Kidneys normal in size and appearance without focal parenchymal abnormality. No hydronephrosis. No evidence of urinary tract calculi. Normal appearing urinary bladder. Stomach/Bowel: Stomach normal in appearance for the degree of distention. Normal-appearing small bowel. Normal appearing decompressed colon. Normal appearing decompressed appendix in the RIGHT UPPER pelvis. Vascular/Lymphatic: Mild abdominal aortic atherosclerosis without evidence of aneurysm. Normal-appearing portal venous and systemic venous systems. No pathologic lymphadenopathy. Reproductive: Small calcified, degenerated uterine fibroid. Uterus otherwise normal in appearance for patient age. No adnexal masses. Other: None. Musculoskeletal: Degenerative disc disease at L4-5. Spondylosis involving the LOWER thoracic spine. Facet degenerative changes diffusely. No acute findings. IMPRESSION: 1. No acute abnormalities involving the abdomen or pelvis. Specifically, no CT evidence for acute pancreatitis currently. 2. Geographic areas of focal hepatic steatosis involving the RIGHT lobe of the liver. Aortic Atherosclerosis (ICD10-I70.0).  Electronically Signed   By: Hulan Saashomas  Lawrence M.D.   On: 02/25/2018 21:13     ASSESSMENT AND PLAN:   Patient is a 67 year old white female with history of asthma presenting with shortness of breath  *Uncontrolled diabetes mellitus with severe hyperglycemia with blood sugars consistently over 400 secondary to IV steroids.  Patient given stat dose of NovoLog and Lantus.  Decrease dose of steroids.  * Acute asthma exacerbation with acute hypoxic respiratory failure -IV steroids - Scheduled Nebulizers - Inhalers -Wean O2 as tolerated - Consult pulmonary if no improvement  *Herpes zoster in the right groin area Continue valacyclovir.  Isolation.  *  Hypertension resume antihypertensives when medication reconciliation is done  *  Miscellaneous Lovenox for DVT prophylax   All the records are reviewed and case discussed with Care Management/Social Worker Management plans discussed with the patient, family and they are in agreement.  CODE STATUS: FULL CODE  DVT Prophylaxis: SCDs  TOTAL TIME TAKING CARE OF THIS PATIENT: 35 minutes.   POSSIBLE D/C IN 2-3 DAYS, DEPENDING ON CLINICAL CONDITION.  Molinda BailiffSrikar R Ziare Cryder M.D on 02/26/2018 at 1:12 PM  Between 7am to 6pm - Pager - [phone]  After 6pm go to [email or web address] - password [email or web address] [facility]  SOUND [place] [facility]  Office  [phone]  CC: Primary care physician; Inc, [facility]  Note: This dictation was prepared with Nurse, children'sDragon dictation along with smaller Lobbyistphrase technology. Any transcriptional errors that result from this process are unintentional.

## 2018-02-26 NOTE — Progress Notes (Signed)
Inpatient Diabetes Program Recommendations  AACE/ADA: New Consensus Statement on Inpatient Glycemic Control (2019)  Target Ranges:  Prepandial:   less than 140 mg/dL      Peak postprandial:   less than 180 mg/dL (1-2 hours)      Critically ill patients:  140 - 180 mg/dL   Results for Wendy Summers, Wendy Summers (MRN [id number]) as of 02/26/2018 08:15  Ref. Range 02/25/2018 22:34 02/26/2018 07:37  Glucose-Capillary Latest Ref Range: 70 - 99 mg/dL 409333 (H) 811441 (H)   Review of Glycemic Control  Diabetes history: DM2 Outpatient Diabetes medications: Glipizide 10 mg QAM, Metformin 2000 mg QPM Current orders for Inpatient glycemic control: Lantus 20 units daily, Novolog 0-9 units TID with meals; Solumedrol 60 mg Q6H  Inpatient Diabetes Program Recommendations:  Insulin - Basal: Noted Lantus 20 units daily ordered and will start today. Correction (SSI): Please consider ordering Novolog 0-5 units QHS for bedtime correction. Insulin - Meal Coverage: If steroids are continued, please consider ordering Novolog 4 units TID with meals for meal coverage.  Thanks, Orlando PennerMarie Kinzey Sheriff, RN, MSN, CDE Diabetes Coordinator Inpatient Diabetes Program [phone] (Team Pager from 8am to 5pm)

## 2018-02-26 NOTE — Progress Notes (Signed)
Called by nursing to evaluate rash.  Patient has vesicular painful rash under abdominal pannus on right that does not cross midline.  She states she has had this before and then it goes away, always in the same place and never crossing midline.  Strong suspicion for shingles.  Airborne isolation, Valacyclovir ordered.  Kristeen MissWILLIS, Havanna Groner FIELDING [facility] 02/26/2018, 12:42 AM  Note:  This document was prepared using Dragon voice recognition software and may include unintentional dictation errors.

## 2018-02-26 NOTE — Progress Notes (Signed)
Right groin swabbed and sent to the laboratory. Awaiting results. Windy Carinaurner,Laticha Ferrucci K, RN 2:04 AM 02/26/2018

## 2018-02-26 NOTE — Progress Notes (Signed)
Blood glucose elevated 441. MD has been notified. 9 units of insulin will be given.

## 2018-02-26 NOTE — Progress Notes (Addendum)
MD messaged about 228/Denley- blood sugar is 408. 9 units of insulin to be given. At 1152.   15 units of insulin provided as verbally ordered per Dr. Elpidio AnisSudini.

## 2018-02-26 NOTE — Progress Notes (Signed)
Patient moved to negative pressure room d/t r/o shingles; Laboratory called for swab for PCR; patient educated; voiced understanding. Windy Carinaurner,Grier Czerwinski K, RN 12:49 AM 02/26/2018

## 2018-02-27 LAB — GLUCOSE, CAPILLARY
Glucose-Capillary: 298 mg/dL — ABNORMAL HIGH (ref 70–99)
Glucose-Capillary: 408 mg/dL — ABNORMAL HIGH (ref 70–99)
Glucose-Capillary: 343 mg/dL — ABNORMAL HIGH (ref 70–99)
Glucose-Capillary: 213 mg/dL — ABNORMAL HIGH (ref 70–99)

## 2018-02-27 LAB — VARICELLA-ZOSTER BY PCR: Varicella-Zoster, PCR: NEGATIVE

## 2018-02-27 MED ORDER — INSULIN ASPART 100 UNIT/ML ~~LOC~~ SOLN
4.0000 [IU] | Freq: Three times a day (TID) | SUBCUTANEOUS | Status: DC
  Administered 2018-02-27 – 2018-03-01 (×5): 4 [IU] via SUBCUTANEOUS
  Filled 2018-02-27 (×5): qty 1

## 2018-02-27 MED ORDER — INSULIN ASPART 100 UNIT/ML ~~LOC~~ SOLN
20.0000 [IU] | SUBCUTANEOUS | Status: AC
  Administered 2018-02-27: 20 [IU] via SUBCUTANEOUS
  Filled 2018-02-27: qty 1

## 2018-02-27 MED ORDER — INSULIN ASPART 100 UNIT/ML ~~LOC~~ SOLN
0.0000 [IU] | Freq: Every day | SUBCUTANEOUS | Status: DC
  Administered 2018-02-27: 2 [IU] via SUBCUTANEOUS
  Administered 2018-02-28: 3 [IU] via SUBCUTANEOUS
  Administered 2018-03-01: 4 [IU] via SUBCUTANEOUS
  Filled 2018-02-27 (×3): qty 1

## 2018-02-27 MED ORDER — INSULIN ASPART 100 UNIT/ML ~~LOC~~ SOLN
0.0000 [IU] | Freq: Three times a day (TID) | SUBCUTANEOUS | Status: DC
  Administered 2018-02-27: 15 [IU] via SUBCUTANEOUS
  Administered 2018-02-28: 20 [IU] via SUBCUTANEOUS
  Administered 2018-02-28: 4 [IU] via SUBCUTANEOUS
  Administered 2018-02-28: 11 [IU] via SUBCUTANEOUS
  Administered 2018-03-01: 3 [IU] via SUBCUTANEOUS
  Administered 2018-03-01: 15 [IU] via SUBCUTANEOUS
  Administered 2018-03-01: 7 [IU] via SUBCUTANEOUS
  Administered 2018-03-02: 11 [IU] via SUBCUTANEOUS
  Administered 2018-03-02: 15 [IU] via SUBCUTANEOUS
  Filled 2018-02-27 (×9): qty 1

## 2018-02-27 NOTE — Progress Notes (Signed)
Inpatient Diabetes Program Recommendations  AACE/ADA: New Consensus Statement on Inpatient Glycemic Control (2019)  Target Ranges:  Prepandial:   less than 140 mg/dL      Peak postprandial:   less than 180 mg/dL (1-2 hours)      Critically ill patients:  140 - 180 mg/dL   Results for Wendy Summers, Wendy Summers (MRN [id number]) as of 02/27/2018 09:08  Ref. Range 02/26/2018 07:37 02/26/2018 11:52 02/26/2018 16:37 02/26/2018 21:17 02/27/2018 07:46  Glucose-Capillary Latest Ref Range: 70 - 99 mg/dL 409441 (H) 811408 (H) 914376 (H) 336 (H) 298 (H)   Review of Glycemic Control Diabetes history: DM2 Outpatient Diabetes medications: Glipizide 10 mg QAM, Metformin 2000 mg QPM Current orders for Inpatient glycemic control: Lantus 20 units daily, Novolog 0-9 units TID with meals; Prednisone 50 mg QAM  Inpatient Diabetes Program Recommendations:  Correction (SSI): Glucose 336 mg/dl at bedtime last night but NO Novolog given since no bedtime correction ordered. Please consider ordering Novolog 0-5 units QHS for bedtime correction. Insulin - Meal Coverage: Noted steroids decreased and changed from IV to PO. If steroids are continued, please consider ordering Novolog 4 units TID with meals for meal coverage.  Thanks, Orlando PennerMarie Deegan Valentino, RN, MSN, CDE Diabetes Coordinator Inpatient Diabetes Program [phone] (Team Pager from 8am to 5pm)

## 2018-02-27 NOTE — Progress Notes (Addendum)
[facility] [facility] - [place] at [facility]   PATIENT NAME: Wendy Summers    MR#:  [id number]  DATE OF BIRTH:  [birth year]  SUBJECTIVE:  CHIEF COMPLAINT:   Chief Complaint  Patient presents with  . Shortness of Breath  . Abdominal Pain   Shortness of breath is no better. Afebrile On 5 L O2 Dry cough. Afebrile  REVIEW OF SYSTEMS:    Review of Systems  Constitutional: Positive for malaise/fatigue. Negative for chills and fever.  HENT: Negative for sore throat.   Eyes: Negative for blurred vision, double vision and pain.  Respiratory: Positive for cough, shortness of breath and wheezing. Negative for hemoptysis.   Cardiovascular: Negative for chest pain, palpitations, orthopnea and leg swelling.  Gastrointestinal: Negative for abdominal pain, constipation, diarrhea, heartburn, nausea and vomiting.  Genitourinary: Negative for dysuria and hematuria.  Musculoskeletal: Negative for back pain and joint pain.  Skin: Positive for rash.  Neurological: Negative for sensory change, speech change, focal weakness and headaches.  Endo/Heme/Allergies: Does not bruise/bleed easily.  Psychiatric/Behavioral: Negative for depression. The patient is not nervous/anxious.    DRUG ALLERGIES:  No Known Allergies  VITALS:  Blood pressure 137/76, pulse (!) 111, temperature 98.3 F (36.8 C), temperature source Oral, resp. rate 20, height 5\' 2"  (1.575 m), weight 127 kg, SpO2 94 %.  PHYSICAL EXAMINATION:   Physical Exam  GENERAL:  67 y.o.-year-old patient lying in the bed .  Obese EYES: Pupils equal, round, reactive to light and accommodation. No scleral icterus. Extraocular muscles intact.  HEENT: Head atraumatic, normocephalic. Oropharynx and nasopharynx clear.  NECK:  Supple, no jugular venous distention. No thyroid enlargement, no tenderness.  LUNGS: Increased work of breathing with bilateral wheezing CARDIOVASCULAR: S1, S2 normal. No murmurs, rubs, or gallops.  ABDOMEN: Soft,  nontender, nondistended. Bowel sounds present. No organomegaly or mass.  EXTREMITIES: No cyanosis, clubbing or edema b/l.    NEUROLOGIC: Cranial nerves II through XII are intact. No focal Motor or sensory deficits b/l.   PSYCHIATRIC: The patient is alert and oriented x 3.  SKIN: Vesiculopapular rash in the right groin and right lower quadrant of the abdomen area.  LABORATORY PANEL:   CBC Recent Labs  Lab 02/26/18 0424  WBC 12.7*  HGB 12.4  HCT 39.0  PLT 283   ------------------------------------------------------------------------------------------------------------------ Chemistries  Recent Labs  Lab 02/25/18 1838  02/26/18 0424  NA 134*  --  133*  K 4.6  --  4.4  CL 98  --  96*  CO2 26  --  23  GLUCOSE 234*  --  461*  BUN 15  --  20  CREATININE 0.90   < > 1.03*  CALCIUM 8.9  --  8.8*  AST 27  --   --   ALT 18  --   --   ALKPHOS 105  --   --   BILITOT 0.9  --   --    < > = values in this interval not displayed.   ------------------------------------------------------------------------------------------------------------------  Cardiac Enzymes No results for input(s): TROPONINI in the last 168 hours. ------------------------------------------------------------------------------------------------------------------  RADIOLOGY:  Dg Chest 2 View  Result Date: 02/25/2018 CLINICAL DATA:  Cough, shortness of breath. EXAM: CHEST - 2 VIEW COMPARISON:  Radiographs of December 06, 2016. FINDINGS: Stable cardiomegaly. No pneumothorax or pleural effusion is noted. Both lungs are clear. The visualized skeletal structures are unremarkable. IMPRESSION: No active cardiopulmonary disease. Electronically Signed   By: Lupita RaiderJames  Green Jr, M.D.   On: 02/25/2018 19:28   Ct  Abdomen Pelvis W Contrast  Result Date: 02/25/2018 CLINICAL DATA:  67 year old who presented with hypoxemia, found on laboratory evaluation to have an elevated lipase. EXAM: CT ABDOMEN AND PELVIS WITH CONTRAST TECHNIQUE:  Multidetector CT imaging of the abdomen and pelvis was performed using the standard protocol following bolus administration of intravenous contrast. CONTRAST:  100mL OMNIPAQUE IOHEXOL 300 MG/ML IV. COMPARISON:  11/14/2011 and earlier. FINDINGS: Lower chest: Heart size normal.  Visualized lung bases clear. Hepatobiliary: Geographic areas of hepatic steatosis involving the RIGHT lobe. No hepatic parenchymal masses. Surgically absent gallbladder. No unexpected biliary ductal dilation. Pancreas: Normal in appearance without evidence of mass, ductal dilation, or inflammation currently. Spleen: Normal in size and appearance. Adrenals/Urinary Tract: Normal appearing adrenal glands. Incompletely rotated RIGHT kidney as noted previously. Kidneys normal in size and appearance without focal parenchymal abnormality. No hydronephrosis. No evidence of urinary tract calculi. Normal appearing urinary bladder. Stomach/Bowel: Stomach normal in appearance for the degree of distention. Normal-appearing small bowel. Normal appearing decompressed colon. Normal appearing decompressed appendix in the RIGHT UPPER pelvis. Vascular/Lymphatic: Mild abdominal aortic atherosclerosis without evidence of aneurysm. Normal-appearing portal venous and systemic venous systems. No pathologic lymphadenopathy. Reproductive: Small calcified, degenerated uterine fibroid. Uterus otherwise normal in appearance for patient age. No adnexal masses. Other: None. Musculoskeletal: Degenerative disc disease at L4-5. Spondylosis involving the LOWER thoracic spine. Facet degenerative changes diffusely. No acute findings. IMPRESSION: 1. No acute abnormalities involving the abdomen or pelvis. Specifically, no CT evidence for acute pancreatitis currently. 2. Geographic areas of focal hepatic steatosis involving the RIGHT lobe of the liver. Aortic Atherosclerosis (ICD10-I70.0). Electronically Signed   By: Hulan Saashomas  Lawrence M.D.   On: 02/25/2018 21:13     ASSESSMENT  AND PLAN:   Patient is a 67 year old white female with history of asthma presenting with shortness of breath  *Uncontrolled diabetes mellitus with severe hyperglycemia with blood sugars consistently over 400 secondary to IV steroids.  Started lantus 20 units yesterday. Add novolog pre meal 4 units. Steroids decreased.  * Acute asthma exacerbation with acute hypoxic respiratory failure -IV steroids and Doxycycline - Scheduled Nebulizers - Inhalers -Wean O2 as tolerated Still on 5 L O2 and needs inpatient care  *Herpes zoster in the right groin area Continue valacyclovir.  Isolation.  *  Hypertension  Home meds  *  Lovenox for DVT prophylax   All the records are reviewed and case discussed with Care Management/Social Worker Management plans discussed with the patient, family and they are in agreement.  CODE STATUS: FULL CODE  DVT Prophylaxis: SCDs  TOTAL TIME TAKING CARE OF THIS PATIENT: 35 minutes.   POSSIBLE D/C IN 2-3 DAYS, DEPENDING ON CLINICAL CONDITION.  Molinda BailiffSrikar R Jaelene Garciagarcia M.D on 02/27/2018 at 12:23 PM  Between 7am to 6pm - Pager - [phone]  After 6pm go to [email or web address] - password [email or web address] [facility]  SOUND [place] [facility]  Office  [phone]  CC: Primary care physician; Inc, [facility]  Note: This dictation was prepared with Nurse, children'sDragon dictation along with smaller Lobbyistphrase technology. Any transcriptional errors that result from this process are unintentional.

## 2018-02-27 NOTE — Plan of Care (Signed)

## 2018-02-28 LAB — GLUCOSE, CAPILLARY
Glucose-Capillary: 189 mg/dL — ABNORMAL HIGH (ref 70–99)
Glucose-Capillary: 284 mg/dL — ABNORMAL HIGH (ref 70–99)
Glucose-Capillary: 377 mg/dL — ABNORMAL HIGH (ref 70–99)
Glucose-Capillary: 252 mg/dL — ABNORMAL HIGH (ref 70–99)

## 2018-02-28 MED ORDER — CARVEDILOL 25 MG PO TABS
25.0000 mg | ORAL_TABLET | Freq: Two times a day (BID) | ORAL | Status: DC
  Administered 2018-02-28 – 2018-03-02 (×4): 25 mg via ORAL
  Filled 2018-02-28 (×4): qty 1

## 2018-02-28 MED ORDER — LISINOPRIL 20 MG PO TABS
20.0000 mg | ORAL_TABLET | Freq: Every day | ORAL | Status: DC
  Administered 2018-02-28 – 2018-03-02 (×3): 20 mg via ORAL
  Filled 2018-02-28 (×3): qty 1

## 2018-02-28 MED ORDER — IPRATROPIUM-ALBUTEROL 0.5-2.5 (3) MG/3ML IN SOLN
3.0000 mL | Freq: Four times a day (QID) | RESPIRATORY_TRACT | Status: DC
  Administered 2018-02-28 – 2018-03-02 (×6): 3 mL via RESPIRATORY_TRACT
  Filled 2018-02-28 (×7): qty 3

## 2018-02-28 MED ORDER — GABAPENTIN 300 MG PO CAPS
300.0000 mg | ORAL_CAPSULE | Freq: Two times a day (BID) | ORAL | Status: DC
  Administered 2018-02-28 – 2018-03-02 (×4): 300 mg via ORAL
  Filled 2018-02-28 (×4): qty 1

## 2018-02-28 MED ORDER — HYDROCHLOROTHIAZIDE 25 MG PO TABS
25.0000 mg | ORAL_TABLET | Freq: Every day | ORAL | Status: DC
  Administered 2018-02-28 – 2018-03-02 (×3): 25 mg via ORAL
  Filled 2018-02-28 (×3): qty 1

## 2018-02-28 NOTE — Progress Notes (Signed)
Order given from Dr Elpidio Anis to restart home BP meds including coreg, lisinopril, and hctz

## 2018-02-28 NOTE — Progress Notes (Signed)
S[employer] [facility] - [place] at [facility]   PATIENT NAME: Wendy Summers    MR#:  [id number]  DATE OF BIRTH:  1950/05/28  SUBJECTIVE:  CHIEF COMPLAINT:   Chief Complaint  Patient presents with  . Shortness of Breath  . Abdominal Pain   Shortness of breath with some improvement.  On 3 L oxygen. Afebrile  REVIEW OF SYSTEMS:    Review of Systems  Constitutional: Positive for malaise/fatigue. Negative for chills and fever.  HENT: Negative for sore throat.   Eyes: Negative for blurred vision, double vision and pain.  Respiratory: Positive for cough, shortness of breath and wheezing. Negative for hemoptysis.   Cardiovascular: Negative for chest pain, palpitations, orthopnea and leg swelling.  Gastrointestinal: Negative for abdominal pain, constipation, diarrhea, heartburn, nausea and vomiting.  Genitourinary: Negative for dysuria and hematuria.  Musculoskeletal: Negative for back pain and joint pain.  Skin: Positive for rash.  Neurological: Negative for sensory change, speech change, focal weakness and headaches.  Endo/Heme/Allergies: Does not bruise/bleed easily.  Psychiatric/Behavioral: Negative for depression. The patient is not nervous/anxious.    DRUG ALLERGIES:  No Known Allergies  VITALS:  Blood pressure (!) 147/78, pulse 96, temperature 98.1 F (36.7 C), temperature source Oral, resp. rate 16, height 5\' 2"  (1.575 m), weight 127 kg, SpO2 93 %.  PHYSICAL EXAMINATION:   Physical Exam  GENERAL:  68 y.o.-year-old patient lying in the bed .  Obese. EYES: Pupils equal, round, reactive to light and accommodation. No scleral icterus. Extraocular muscles intact.  HEENT: Head atraumatic, normocephalic. Oropharynx and nasopharynx clear.  NECK:  Supple, no jugular venous distention. No thyroid enlargement, no tenderness.  LUNGS: Increased work of breathing with bilateral wheezing CARDIOVASCULAR: S1, S2 normal. No murmurs, rubs, or gallops.  ABDOMEN: Soft, nontender,  nondistended. Bowel sounds present. No organomegaly or mass.  EXTREMITIES: No cyanosis, clubbing or edema b/l.    NEUROLOGIC: Cranial nerves II through XII are intact. No focal Motor or sensory deficits b/l.   PSYCHIATRIC: The patient is alert and oriented x 3.  SKIN: Vesiculopapular rash in the right groin and right lower quadrant of the abdomen area.  LABORATORY PANEL:   CBC Recent Labs  Lab 02/26/18 0424  WBC 12.7*  HGB 12.4  HCT 39.0  PLT 283   ------------------------------------------------------------------------------------------------------------------ Chemistries  Recent Labs  Lab 02/25/18 1838  02/26/18 0424  NA 134*  --  133*  K 4.6  --  4.4  CL 98  --  96*  CO2 26  --  23  GLUCOSE 234*  --  461*  BUN 15  --  20  CREATININE 0.90   < > 1.03*  CALCIUM 8.9  --  8.8*  AST 27  --   --   ALT 18  --   --   ALKPHOS 105  --   --   BILITOT 0.9  --   --    < > = values in this interval not displayed.   ------------------------------------------------------------------------------------------------------------------  Cardiac Enzymes No results for input(s): TROPONINI in the last 168 hours. ------------------------------------------------------------------------------------------------------------------  RADIOLOGY:  No results found.   ASSESSMENT AND PLAN:   Patient is a 68 year old white female with history of asthma presenting with shortness of breath  *Uncontrolled diabetes mellitus with severe hyperglycemia secondary to steroids Improved with Lantus and NovoLog.  Patient is on oral hypoglycemics at home.  * Acute asthma exacerbation with acute hypoxic respiratory failure - steroids and Doxycycline - Scheduled Nebulizers - Inhalers -Wean O2 as tolerated Still  on 3 L O2 and needs inpatient care.  Wean oxygen prior to discharge  *Herpes zoster in the right groin area Continue valacyclovir.  Isolation.  *  Hypertension  Home meds  *  Lovenox for  DVT prophylax  All the records are reviewed and case discussed with Care Management/Social Worker Management plans discussed with the patient, family and they are in agreement.  CODE STATUS: FULL CODE  DVT Prophylaxis: SCDs  TOTAL TIME TAKING CARE OF THIS PATIENT: 35 minutes.   POSSIBLE D/C IN 2-3 DAYS, DEPENDING ON CLINICAL CONDITION.  Molinda Bailiff Arma Reining M.D on 02/28/2018 at 1:54 PM  Between 7am to 6pm - Pager - [phone]  After 6pm go to [email or web address] - password [email or web address] [place]  SOUND [place] Hospitalists  Office  [phone]  CC: Primary care physician; Inc, [employer]  Note: This dictation was prepared with Nurse, children's dictation along with smaller Lobbyist. Any transcriptional errors that result from this process are unintentional.

## 2018-03-01 LAB — GLUCOSE, CAPILLARY
GLUCOSE-CAPILLARY: 316 mg/dL — AB (ref 70–99)
Glucose-Capillary: 131 mg/dL — ABNORMAL HIGH (ref 70–99)
Glucose-Capillary: 245 mg/dL — ABNORMAL HIGH (ref 70–99)
Glucose-Capillary: 317 mg/dL — ABNORMAL HIGH (ref 70–99)

## 2018-03-01 MED ORDER — METHYLPREDNISOLONE SODIUM SUCC 40 MG IJ SOLR
40.0000 mg | Freq: Two times a day (BID) | INTRAMUSCULAR | Status: DC
  Administered 2018-03-01 – 2018-03-02 (×2): 40 mg via INTRAVENOUS
  Filled 2018-03-01 (×2): qty 1

## 2018-03-01 MED ORDER — INSULIN ASPART 100 UNIT/ML ~~LOC~~ SOLN
10.0000 [IU] | Freq: Three times a day (TID) | SUBCUTANEOUS | Status: DC
  Administered 2018-03-01 – 2018-03-02 (×3): 10 [IU] via SUBCUTANEOUS
  Filled 2018-03-01 (×3): qty 1

## 2018-03-01 NOTE — Progress Notes (Signed)
[facility] - [place] [facility]   PATIENT NAME: Wendy Summers    MR#:  [id number]  DATE OF BIRTH:  Aug 20, 1950  SUBJECTIVE:  CHIEF COMPLAINT:   Chief Complaint  Patient presents with  . Shortness of Breath  . Abdominal Pain   Reporting exertional shortness of breath on 3 L oxygen.  Intermittent episodes of cough Afebrile  REVIEW OF SYSTEMS:    Review of Systems  Constitutional: Positive for malaise/fatigue. Negative for chills and fever.  HENT: Negative for sore throat.   Eyes: Negative for blurred vision, double vision and pain.  Respiratory: Positive for cough, shortness of breath and wheezing. Negative for hemoptysis.   Cardiovascular: Negative for chest pain, palpitations, orthopnea and leg swelling.  Gastrointestinal: Negative for abdominal pain, constipation, diarrhea, heartburn, nausea and vomiting.  Genitourinary: Negative for dysuria and hematuria.  Musculoskeletal: Negative for back pain and joint pain.  Skin: Positive for rash.  Neurological: Negative for sensory change, speech change, focal weakness and headaches.  Endo/Heme/Allergies: Does not bruise/bleed easily.  Psychiatric/Behavioral: Negative for depression. The patient is not nervous/anxious.    DRUG ALLERGIES:  No Known Allergies  VITALS:  Blood pressure (!) 121/58, pulse 69, temperature 98.4 F (36.9 C), temperature source Oral, resp. rate 17, height 5\' 2"  (1.575 m), weight 127 kg, SpO2 93 %.  PHYSICAL EXAMINATION:   Physical Exam  GENERAL:  68 y.o.-year-old patient lying in the bed .  Obese. EYES: Pupils equal, round, reactive to light and accommodation. No scleral icterus. Extraocular muscles intact.  HEENT: Head atraumatic, normocephalic. Oropharynx and nasopharynx clear.  NECK:  Supple, no jugular venous distention. No thyroid enlargement, no tenderness.  LUNGS: Increased work of breathing with bilateral wheezing CARDIOVASCULAR: S1, S2 normal. No murmurs, rubs, or gallops.   ABDOMEN: Soft, nontender, nondistended. Bowel sounds present. No organomegaly or mass.  EXTREMITIES: No cyanosis, clubbing or edema b/l.    NEUROLOGIC: Cranial nerves II through XII are intact. No focal Motor or sensory deficits b/l.   PSYCHIATRIC: The patient is alert and oriented x 3.  SKIN: Vesiculopapular rash in the right groin and right lower quadrant of the abdomen area.  LABORATORY PANEL:   CBC Recent Labs  Lab 02/26/18 0424  WBC 12.7*  HGB 12.4  HCT 39.0  PLT 283   ------------------------------------------------------------------------------------------------------------------ Chemistries  Recent Labs  Lab 02/25/18 1838  02/26/18 0424  NA 134*  --  133*  K 4.6  --  4.4  CL 98  --  96*  CO2 26  --  23  GLUCOSE 234*  --  461*  BUN 15  --  20  CREATININE 0.90   < > 1.03*  CALCIUM 8.9  --  8.8*  AST 27  --   --   ALT 18  --   --   ALKPHOS 105  --   --   BILITOT 0.9  --   --    < > = values in this interval not displayed.   ------------------------------------------------------------------------------------------------------------------  Cardiac Enzymes No results for input(s): TROPONINI in the last 168 hours. ------------------------------------------------------------------------------------------------------------------  RADIOLOGY:  No results found.   ASSESSMENT AND PLAN:   Patient is a 68 year old white female with history of asthma presenting with shortness of breath  *Uncontrolled diabetes mellitus with severe hyperglycemia secondary to steroids Better with Lantus and NovoLog will be increased to 10 units 3 times daily with meals and sliding scale patient is on oral hypoglycemics at home.  * Acute asthma exacerbation with acute hypoxic  respiratory failure - steroid tapering, continue bronchodilator treatments and Doxycycline - Scheduled Nebulizers --Wean O2 as tolerated Still on 3 L O2 and needs inpatient care.  Wean oxygen prior to  discharge  *Herpes zoster in the right groin area Continue valacyclovir.  Isolation.  *  Hypertension  Home meds  *  Lovenox for DVT prophylax  All the records are reviewed and case discussed with Care Management/Social Worker Management plans discussed with the patient, family and they are in agreement.  CODE STATUS: FULL CODE  DVT Prophylaxis: SCDs  TOTAL TIME TAKING CARE OF THIS PATIENT: 35 minutes.   POSSIBLE D/C IN 2-3 DAYS, DEPENDING ON CLINICAL CONDITION.  Ramonita Lab M.D on 03/01/2018 at 3:50 PM  Between 7am to 6pm - Pager - [phone]  After 6pm go to [email or web address] - password [email or web address] [facility]  [facility] [place] [facility]  Office  [phone]  CC: Primary care physician; [employer], [employer]  Note: This dictation was prepared with Nurse, children's dictation along with smaller Lobbyist. Any transcriptional errors that result from this process are unintentional.

## 2018-03-01 NOTE — Progress Notes (Signed)
Inpatient Diabetes Program Recommendations  AACE/ADA: New Consensus Statement on Inpatient Glycemic Control (2019)  Target Ranges:  Prepandial:   less than 140 mg/dL      Peak postprandial:   less than 180 mg/dL (1-2 hours)      Critically ill patients:  140 - 180 mg/dL   Results for Wendy Summers, Wendy Summers (MRN [id number]) as of 03/01/2018 09:57  Ref. Range 02/28/2018 07:47 02/28/2018 12:06 02/28/2018 16:30 02/28/2018 21:38 03/01/2018 08:50  Glucose-Capillary Latest Ref Range: 70 - 99 mg/dL 157 (H) 262 (H) 035 (H) 252 (H) 131 (H)   Review of Glycemic Control  Diabetes history:DM2 Outpatient Diabetes medications:Glipizide 10 mg QAM, Metformin 2000 mg QPM Current orders for Inpatient glycemic control:Lantus 20 units daily, Novolog 4 units TID with meals, Novolog 0-20 units TID with meals; Prednisone 50 mg QAM  Inpatient Diabetes Program Recommendations:  Insulin - Meal Coverage: Post prandial glucose is consistently elevated.  If steroids are continued, please consider increasing meal coverage to Novolog 10 units TID with meals.  Thanks, Orlando Penner, RN, MSN, CDE Diabetes Coordinator Inpatient Diabetes Program [phone] (Team Pager from 8am to 5pm)

## 2018-03-02 LAB — GLUCOSE, CAPILLARY
Glucose-Capillary: 313 mg/dL — ABNORMAL HIGH (ref 70–99)
Glucose-Capillary: 292 mg/dL — ABNORMAL HIGH (ref 70–99)

## 2018-03-02 MED ORDER — PREDNISONE 10 MG (21) PO TBPK: ORAL_TABLET | ORAL | 0 refills | Status: DC

## 2018-03-02 MED ORDER — ALBUTEROL SULFATE (2.5 MG/3ML) 0.083% IN NEBU: 2.5000 mg | INHALATION_SOLUTION | RESPIRATORY_TRACT | 0 refills | Status: DC | PRN

## 2018-03-02 MED ORDER — DOXYCYCLINE HYCLATE 100 MG PO TABS: 100.0000 mg | ORAL_TABLET | Freq: Two times a day (BID) | ORAL | 0 refills | Status: AC

## 2018-03-02 MED ORDER — FLUTICASONE-SALMETEROL 100-50 MCG/DOSE IN AEPB: 1.0000 | INHALATION_SPRAY | Freq: Two times a day (BID) | RESPIRATORY_TRACT | 0 refills | Status: DC

## 2018-03-02 MED ORDER — ALBUTEROL SULFATE HFA 108 (90 BASE) MCG/ACT IN AERS: 2.0000 | INHALATION_SPRAY | Freq: Four times a day (QID) | RESPIRATORY_TRACT | 1 refills | Status: DC | PRN

## 2018-03-02 MED ORDER — GABAPENTIN 300 MG PO CAPS: 300.0000 mg | ORAL_CAPSULE | Freq: Two times a day (BID) | ORAL | 0 refills | Status: DC

## 2018-03-02 MED ORDER — GUAIFENESIN-DM 100-10 MG/5ML PO SYRP: 5.0000 mL | ORAL_SOLUTION | ORAL | 0 refills | Status: DC | PRN

## 2018-03-02 MED ORDER — TIOTROPIUM BROMIDE MONOHYDRATE 18 MCG IN CAPS: 18.0000 ug | ORAL_CAPSULE | Freq: Every day | RESPIRATORY_TRACT | 1 refills | Status: DC

## 2018-03-02 NOTE — Progress Notes (Signed)
Inpatient Diabetes Program Recommendations  AACE/ADA: New Consensus Statement on Inpatient Glycemic Control  Target Ranges:  Prepandial:   less than 140 mg/dL      Peak postprandial:   less than 180 mg/dL (1-2 hours)      Critically ill patients:  140 - 180 mg/dL   Results for Wendy Summers, Wendy Summers (MRN [id number]) as of 03/02/2018 11:48  Ref. Range 03/01/2018 08:50 03/01/2018 11:59 03/01/2018 16:54 03/01/2018 21:00 03/02/2018 06:01 03/02/2018 07:34  Glucose-Capillary Latest Ref Range: 70 - 99 mg/dL 409131 (H)  Novolog 7 units  Lantus 20 units  Prednisone 50 mg 245 (H)  Novolog 7 units 316 (H)  Novolog 25 units     Solumedrol 40 mg 317 (H)  Novolog 4 units         Solumedrol 40 mg  313 (H)  Novolog 25 units     Review of Glycemic Control  Diabetes history:DM2 Outpatient Diabetes medications:Glipizide 10 mg QAM, Metformin 2000 mg QPM Current orders for Inpatient glycemic control:Lantus 20 units daily, Novolog 10 units TID with meals, Novolog 0-20 units TID with meals; Solumedrol 40 mg Q12H  Inpatient Diabetes Program Recommendations:   Insulin-Basal: If steroids are continued as ordered, please consider increasing Lantus to 25 units daily. Please note that as steroids are decreased, basal insulin and meal coverage will also need to be adjusted.  Insulin - Meal Coverage: Post prandial glucose is consistently elevated and steroids were increased on 03/01/18.  If steroids are continued as ordered, please consider increasing meal coverage to Novolog 13 units TID with meals.  Thanks, Orlando PennerMarie Dnasia Gauna, RN, MSN, CDE Diabetes Coordinator Inpatient Diabetes Program [phone] (Team Pager from 8am to 5pm)

## 2018-03-02 NOTE — Progress Notes (Signed)
Nsg Discharge Note  Admit Date:  02/25/2018 Discharge date: 03/02/2018   Wendy Summers to be D/C'd Home per MD order.  AVS completed.  Copy for chart, and copy for patient signed, and dated. Patient/caregiver able to verbalize understanding.  Discharge Medication: Allergies as of 03/02/2018   No Known Allergies     Medication List    STOP taking these medications   benzonatate 200 MG capsule Commonly known as:  TESSALON   chlorpheniramine-HYDROcodone 10-8 MG/5ML Suer Commonly known as:  TUSSIONEX   oxyCODONE-acetaminophen 5-325 MG tablet Commonly known as:  PERCOCET   PROBIOTIC DAILY Caps     TAKE these medications   albuterol (2.5 MG/3ML) 0.083% nebulizer solution Commonly known as:  PROVENTIL Take 3 mLs (2.5 mg total) by nebulization every 4 (four) hours as needed for wheezing or shortness of breath. What changed:    when to take this  reasons to take this   albuterol 108 (90 Base) MCG/ACT inhaler Commonly known as:  PROVENTIL HFA;VENTOLIN HFA Inhale 2 puffs into the lungs every 6 (six) hours as needed for wheezing or shortness of breath. What changed:  You were already taking a medication with the same name, and this prescription was added. Make sure you understand how and when to take each.   atorvastatin 40 MG tablet Commonly known as:  LIPITOR Take 40 mg by mouth daily.   carvedilol 25 MG tablet Commonly known as:  COREG Take 25 mg by mouth 2 (two) times daily.   doxycycline 100 MG tablet Commonly known as:  VIBRA-TABS Take 1 tablet (100 mg total) by mouth every 12 (twelve) hours for 9 doses.   Fluticasone-Salmeterol 100-50 MCG/DOSE Aepb Commonly known as:  ADVAIR DISKUS Inhale 1 puff into the lungs 2 (two) times daily.   gabapentin 300 MG capsule Commonly known as:  NEURONTIN Take 1 capsule (300 mg total) by mouth 2 (two) times daily.   glipiZIDE 10 MG tablet Commonly known as:  GLUCOTROL Take 10 mg by mouth daily before breakfast.    guaiFENesin-dextromethorphan 100-10 MG/5ML syrup Commonly known as:  ROBITUSSIN DM Take 5 mLs by mouth every 4 (four) hours as needed for cough.   latanoprost 0.005 % ophthalmic solution Commonly known as:  XALATAN Place 1 drop into both eyes at bedtime.   lisinopril-hydrochlorothiazide 20-25 MG tablet Commonly known as:  PRINZIDE,ZESTORETIC Take 1 tablet by mouth daily.   metFORMIN 1000 MG tablet Commonly known as:  GLUCOPHAGE Take 2,000 mg by mouth daily after supper.   predniSONE 10 MG (21) Tbpk tablet Commonly known as:  STERAPRED UNI-PAK 21 TAB 6 tabs PO x 1 day 5 tabs PO x 2 days 4 tabs PO x 2 days 3 tabs PO x 2 days 2 tabs PO x 2 days 1 tab PO x 2 days and stop   tiotropium 18 MCG inhalation capsule Commonly known as:  SPIRIVA Place 1 capsule (18 mcg total) into inhaler and inhale daily.       Discharge Assessment: Vitals:   03/02/18 0520 03/02/18 1155  BP:  (!) 148/66  Pulse: 79 71  Resp:  16  Temp:  99.2 F (37.3 C)  SpO2: 93% 95%   Skin clean, dry and intact without evidence of skin break down, no evidence of skin tears noted. IV catheter discontinued intact. Site without signs and symptoms of complications - no redness or edema noted at insertion site, patient denies c/o pain - only slight tenderness at site.  Dressing with slight pressure applied.  D/c Instructions-Education: Discharge instructions given to patient/family with verbalized understanding. D/c education completed with patient/family including follow up instructions, medication list, d/c activities limitations if indicated, with other d/c instructions as indicated by MD - patient able to verbalize understanding, all questions fully answered. Patient instructed to return to ED, call 911, or call MD for any changes in condition.  Patient escorted via WC, and D/C home via private auto.  Adair Laundry, RN 03/02/2018 1:50 PM

## 2018-03-02 NOTE — Discharge Instructions (Signed)
Follow-up with primary care physician in 3 to 5 days °

## 2018-03-02 NOTE — Discharge Summary (Signed)
[facility] [place] [facility]   PATIENT NAME: Wendy Pattersonancy Proehl    MR#:  [id number]  DATE OF BIRTH:  [birth year]  DATE OF ADMISSION:  02/25/2018 ADMITTING PHYSICIAN: Auburn BilberryShreyang Patel, MD  DATE OF DISCHARGE:  03/02/18  [facility] C[facility] PHYSIC[facility]: [facility]    ADMISSION DIAGNOSIS:  Acute respiratory failure with hypoxia (HCC) [J96.01] Acute pancreatitis, unspecified complication status, unspecified pancreatitis type [K85.90] Exacerbation of asthma, unspecified asthma severity, unspecified whether persistent [J45.901]  DISCHARGE DIAGNOSIS:  Active Problems:   Acute asthma exacerbation   SECONDARY DIAGNOSIS:   Past Medical History:  Diagnosis Date  . Asthma   . Bronchitis   . Diabetes mellitus without complication (HCC)   . Migraine     HOSPITAL COURSE:  HISTORY OF PRESENT ILLNESS: Wendy Summers  is a 68 y.o. female with a known history of asthma, bronchitis, diabetes and migraine who is presenting to the emergency room with complaint of shortness of breath ongoing for 1 week has gotten progressively worse.  Patient states that she has been having more wheezing and coughing.  Is now requiring oxygen in the emergency room.  She states that her cough is greenish in color.  She does have some fevers.  She states that she has been having epigastric pain with coughing.  In the emergency room she was noted to have a lipase that was mildly elevated  *Uncontrolled diabetes mellitus with hyperglycemia secondary to steroids Clinically doing better.  Discontinue IV steroids and discharge home with p.o. prednisone on oral hypoglycemics at home.  *Acute asthma exacerbation with acute hypoxic respiratory failure Clinically improving wants to go home we will discharge patient - steroid tapering, continue bronchodilator treatments and Doxycycline - Scheduled Nebulizer Weaned off  oxygen prior to discharge  *Probable herpes zoster in the right groin  area Patient was on isolation and treated with valacyclovir.  Zoster PCR is negative discontinue valacyclovir  *Hypertension  Home meds  * Lovenox for DVT prophylax during the hospital course Discharge home DISCHARGE CONDITIONS:   stable CONSULTS OBTAINED:     PROCEDURES  None  DRUG ALLERGIES:  No Known Allergies  DISCHARGE MEDICATIONS:   Allergies as of 03/02/2018   No Known Allergies     Medication List    STOP taking these medications   benzonatate 200 MG capsule Commonly known as:  TESSALON   chlorpheniramine-HYDROcodone 10-8 MG/5ML Suer Commonly known as:  TUSSIONEX   oxyCODONE-acetaminophen 5-325 MG tablet Commonly known as:  PERCOCET   PROBIOTIC DAILY Caps     TAKE these medications   albuterol (2.5 MG/3ML) 0.083% nebulizer solution Commonly known as:  PROVENTIL Take 3 mLs (2.5 mg total) by nebulization every 4 (four) hours as needed for wheezing or shortness of breath. What changed:    when to take this  reasons to take this   albuterol 108 (90 Base) MCG/ACT inhaler Commonly known as:  PROVENTIL HFA;VENTOLIN HFA Inhale 2 puffs into the lungs every 6 (six) hours as needed for wheezing or shortness of breath. What changed:  You were already taking a medication with the same name, and this prescription was added. Make sure you understand how and when to take each.   atorvastatin 40 MG tablet Commonly known as:  LIPITOR Take 40 mg by mouth daily.   carvedilol 25 MG tablet Commonly known as:  COREG Take 25 mg by mouth 2 (two) times daily.   doxycycline 100 MG tablet Commonly known as:  VIBRA-TABS Take 1 tablet (100  mg total) by mouth every 12 (twelve) hours for 9 doses.   Fluticasone-Salmeterol 100-50 MCG/DOSE Aepb Commonly known as:  ADVAIR DISKUS Inhale 1 puff into the lungs 2 (two) times daily.   gabapentin 300 MG capsule Commonly known as:  NEURONTIN Take 1 capsule (300 mg total) by mouth 2 (two) times daily.   glipiZIDE 10 MG  tablet Commonly known as:  GLUCOTROL Take 10 mg by mouth daily before breakfast.   guaiFENesin-dextromethorphan 100-10 MG/5ML syrup Commonly known as:  ROBITUSSIN DM Take 5 mLs by mouth every 4 (four) hours as needed for cough.   latanoprost 0.005 % ophthalmic solution Commonly known as:  XALATAN Place 1 drop into both eyes at bedtime.   lisinopril-hydrochlorothiazide 20-25 MG tablet Commonly known as:  PRINZIDE,ZESTORETIC Take 1 tablet by mouth daily.   metFORMIN 1000 MG tablet Commonly known as:  GLUCOPHAGE Take 2,000 mg by mouth daily after supper.   predniSONE 10 MG (21) Tbpk tablet Commonly known as:  STERAPRED UNI-PAK 21 TAB 6 tabs PO x 1 day 5 tabs PO x 2 days 4 tabs PO x 2 days 3 tabs PO x 2 days 2 tabs PO x 2 days 1 tab PO x 2 days and stop   tiotropium 18 MCG inhalation capsule Commonly known as:  SPIRIVA Place 1 capsule (18 mcg total) into inhaler and inhale daily.        DISCHARGE INSTRUCTIONS:   Follow-up with primary care physician in 3 to 5 days  DIET:  Cardiac diet  DISCHARGE CONDITION:  Stable  ACTIVITY:  Activity as tolerated  OXYGEN:  Home Oxygen: No.   Oxygen Delivery: room air  DISCHARGE LOCATION:  home   If you experience worsening of your admission symptoms, develop shortness of breath, life threatening emergency, suicidal or homicidal thoughts you must seek medical attention immediately by calling 911 or calling your MD immediately  if symptoms less severe.  You Must read complete instructions/literature along with all the possible adverse reactions/side effects for all the Medicines you take and that have been prescribed to you. Take any new Medicines after you have completely understood and accpet all the possible adverse reactions/side effects.   Please note  You were cared for by a hospitalist during your hospital stay. If you have any questions about your discharge medications or the care you received while you were in the  hospital after you are discharged, you can call the unit and asked to speak with the hospitalist on call if the hospitalist that took care of you is not available. Once you are discharged, your primary care physician will handle any further medical issues. Please note that NO REFILLS for any discharge medications will be authorized once you are discharged, as it is imperative that you return to your primary care physician (or establish a relationship with a primary care physician if you do not have one) for your aftercare needs so that they can reassess your need for medications and monitor your lab values.     Today  Chief Complaint  Patient presents with  . Shortness of Breath  . Abdominal Pain   Patient is doing much better.  Shortness of breath and wheezing improved oxygen weaned off.  Wants to go home we will discharge patient home  ROS:  CONSTITUTIONAL: Denies fevers, chills. Denies any fatigue, weakness.  EYES: Denies blurry vision, double vision, eye pain. EARS, NOSE, THROAT: Denies tinnitus, ear pain, hearing loss. RESPIRATORY: Improving cough, denies wheeze, shortness of breath.  CARDIOVASCULAR:  Denies chest pain, palpitations, edema.  GASTROINTESTINAL: Denies nausea, vomiting, diarrhea, abdominal pain. Denies bright red blood per rectum. GENITOURINARY: Denies dysuria, hematuria. ENDOCRINE: Denies nocturia or thyroid problems. HEMATOLOGIC AND LYMPHATIC: Denies easy bruising or bleeding. SKIN: Denies rash or lesion. MUSCULOSKELETAL: Denies pain in neck, back, shoulder, knees, hips or arthritic symptoms.  NEUROLOGIC: Denies paralysis, paresthesias.  PSYCHIATRIC: Denies anxiety or depressive symptoms.   VITAL SIGNS:  Blood pressure (!) 148/66, pulse 71, temperature 99.2 F (37.3 C), temperature source Oral, resp. rate 16, height 5\' 2"  (1.575 m), weight 127 kg, SpO2 95 %.  I/O:    Intake/Output Summary (Last 24 hours) at 03/02/2018 1313 Last data filed at 03/02/2018 0526 Gross  per 24 hour  Intake -  Output 900 ml  Net -900 ml    PHYSICAL EXAMINATION:  GENERAL:  68 y.o.-year-old patient lying in the bed with no acute distress.  EYES: Pupils equal, round, reactive to light and accommodation. No scleral icterus. Extraocular muscles intact.  HEENT: Head atraumatic, normocephalic. Oropharynx and nasopharynx clear.  NECK:  Supple, no jugular venous distention. No thyroid enlargement, no tenderness.  LUNGS: Normal breath sounds bilaterally, no wheezing, rales,rhonchi or crepitation. No use of accessory muscles of respiration.  CARDIOVASCULAR: S1, S2 normal. No murmurs, rubs, or gallops.  ABDOMEN: Soft, non-tender, non-distended. Bowel sounds present.  EXTREMITIES: No pedal edema, cyanosis, or clubbing.  NEUROLOGIC: Awake, alert and oriented x3. Sensation intact. Gait not checked.  PSYCHIATRIC: The patient is alert and oriented x 3.  SKIN: No obvious rash, lesion, or ulcer.   DATA REVIEW:   CBC Recent Labs  Lab 02/26/18 0424  WBC 12.7*  HGB 12.4  HCT 39.0  PLT 283    Chemistries  Recent Labs  Lab 02/25/18 1838  02/26/18 0424  NA 134*  --  133*  K 4.6  --  4.4  CL 98  --  96*  CO2 26  --  23  GLUCOSE 234*  --  461*  BUN 15  --  20  CREATININE 0.90   < > 1.03*  CALCIUM 8.9  --  8.8*  AST 27  --   --   ALT 18  --   --   ALKPHOS 105  --   --   BILITOT 0.9  --   --    < > = values in this interval not displayed.    Cardiac Enzymes No results for input(s): TROPONINI in the last 168 hours.  Microbiology Results  Results for orders placed or performed during the hospital encounter of 11/08/16  Urine culture     Status: Abnormal   Collection Time: 11/08/16  7:30 PM  Result Value Ref Range Status   Specimen Description URINE, RANDOM  Final   Special Requests NONE  Final   Culture >=100,000 COLONIES/mL ESCHERICHIA COLI (A)  Final   Report Status 11/11/2016 FINAL  Final   Organism ID, Bacteria ESCHERICHIA COLI (A)  Final      Susceptibility    Escherichia coli - MIC*    AMPICILLIN 8 SENSITIVE Sensitive     CEFAZOLIN <=4 SENSITIVE Sensitive     CEFTRIAXONE <=1 SENSITIVE Sensitive     CIPROFLOXACIN <=0.25 SENSITIVE Sensitive     GENTAMICIN <=1 SENSITIVE Sensitive     IMIPENEM <=0.25 SENSITIVE Sensitive     NITROFURANTOIN <=16 SENSITIVE Sensitive     TRIMETH/SULFA <=20 SENSITIVE Sensitive     AMPICILLIN/SULBACTAM 4 SENSITIVE Sensitive     Extended ESBL NEGATIVE Sensitive     * >=  100,000 COLONIES/mL ESCHERICHIA COLI    RADIOLOGY:  No results found.  EKG:   Orders placed or performed during the hospital encounter of 02/25/18  . ED EKG  . ED EKG      Management plans discussed with the patient, she is in agreement.  CODE STATUS:     Code Status Orders  (From admission, onward)         Start     Ordered   02/25/18 2225  Full code  Continuous     02/25/18 2225        Code Status History    Date Active Date Inactive Code Status Order ID Comments User Context   07/13/2016 0651 07/18/2016 1555 Full Code [id number]  Ihor AustinPyreddy, Pavan, MD Inpatient      TOTAL TIME TAKING CARE OF THIS PATIENT: 43 minutes.   Note: This dictation was prepared with Dragon dictation along with smaller phrase technology. Any transcriptional errors that result from this process are unintentional.   @M[facility] @  on 03/02/2018 at 1:13 PM  Between 7am to 6pm - Pager - [phone]  After 6pm go to [email or web address] - password [email or web address]  [facility]  Office  [phone]  CC: Primary care physician; [facility] [facility]

## 2018-03-08 ENCOUNTER — Emergency Department: Payer: Medicaid Other

## 2018-03-08 ENCOUNTER — Encounter: Payer: Self-pay | Admitting: Emergency Medicine

## 2018-03-08 ENCOUNTER — Other Ambulatory Visit: Payer: Self-pay

## 2018-03-08 ENCOUNTER — Emergency Department
Admission: EM | Admit: 2018-03-08 | Discharge: 2018-03-09 | Disposition: A | Discharge status: Home / Self Care | Payer: Medicaid Other | Attending: Emergency Medicine | Admitting: Emergency Medicine

## 2018-03-08 DIAGNOSIS — Z79899 Other long term (current) drug therapy: Secondary | ICD-10-CM | POA: Diagnosis not present

## 2018-03-08 DIAGNOSIS — J45909 Unspecified asthma, uncomplicated: Secondary | ICD-10-CM | POA: Diagnosis not present

## 2018-03-08 DIAGNOSIS — E119 Type 2 diabetes mellitus without complications: Secondary | ICD-10-CM | POA: Diagnosis not present

## 2018-03-08 DIAGNOSIS — M542 Cervicalgia: Secondary | ICD-10-CM | POA: Insufficient documentation

## 2018-03-08 DIAGNOSIS — R55 Syncope and collapse: Secondary | ICD-10-CM | POA: Diagnosis not present

## 2018-03-08 LAB — BASIC METABOLIC PANEL
ANION GAP: 10 (ref 5–15)
BUN: 33 mg/dL — ABNORMAL HIGH (ref 8–23)
CO2: 23 mmol/L (ref 22–32)
Calcium: 8.9 mg/dL (ref 8.9–10.3)
Chloride: 104 mmol/L (ref 98–111)
Creatinine, Ser: 1.5 mg/dL — ABNORMAL HIGH (ref 0.44–1.00)
GFR calc non Af Amer: 36 mL/min — ABNORMAL LOW (ref >60)
GFR, EST AFRICAN AMERICAN: 42 mL/min — AB (ref >60)
Glucose, Bld: 195 mg/dL — ABNORMAL HIGH (ref 70–99)
Potassium: 3.9 mmol/L (ref 3.5–5.1)
SODIUM: 137 mmol/L (ref 135–145)

## 2018-03-08 LAB — CBC
HCT: 42.2 % (ref 36.0–46.0)
Hemoglobin: 13.6 g/dL (ref 12.0–15.0)
MCH: 30.6 pg (ref 26.0–34.0)
MCHC: 32.2 g/dL (ref 30.0–36.0)
MCV: 94.8 fL (ref 80.0–100.0)
NRBC: 0 % (ref 0.0–0.2)
Platelets: 368 10*3/uL (ref 150–400)
RBC: 4.45 MIL/uL (ref 3.87–5.11)
RDW: 13.1 % (ref 11.5–15.5)
WBC: 16.4 10*3/uL — ABNORMAL HIGH (ref 4.0–10.5)

## 2018-03-08 LAB — TROPONIN I: Troponin I: <0.03 ng/mL (ref <0.03)

## 2018-03-08 LAB — GLUCOSE, CAPILLARY: Glucose-Capillary: 183 mg/dL — ABNORMAL HIGH (ref 70–99)

## 2018-03-08 MED ORDER — SODIUM CHLORIDE 0.9 % IV BOLUS
1000.0000 mL | Freq: Once | INTRAVENOUS | Status: AC
  Administered 2018-03-08: 1000 mL via INTRAVENOUS

## 2018-03-08 NOTE — ED Provider Notes (Signed)
[facility] Emergency Department Provider Note  ____________________________________________   First MD Initiated Contact with Patient 03/08/18 2028     (approximate)  I have reviewed the triage vital signs and the nursing notes.   HISTORY  Chief Complaint Dizziness and Loss of Consciousness   HPI Wendy Summers is a 68 y.o. female with a history of bronchitis, diabetes and asthma who was presented to the emergency department today complaining of a loss of consciousness.  Says that she was walking to the bathroom when she started having severe neck pain.  She states that she then felt very lightheaded and passed out.  Her husband states that she fell to the ground and was unconscious for approximately 10 minutes.  She is still complaining of posterior neck pain.  Says that she has passed out once before when having neck pain as well.  Denies any injury recently.  Denies any chest pain or shortness of breath.  Says that she has been eating and drinking normally.  Was recently admitted to the hospital for an asthma attack.  Says that she has taken her blood pressure medications as prescribed.  No reports of diarrhea, nausea or vomiting.     Past Medical History:  Diagnosis Date  . Asthma   . Bronchitis   . Diabetes mellitus without complication (HCC)   . Migraine     Patient Active Problem List   Diagnosis Date Noted  . Acute asthma exacerbation 02/25/2018  . Asthmatic bronchitis 07/14/2016  . Near syncope 07/13/2016    Past Surgical History:  Procedure Laterality Date  . CHOLECYSTECTOMY      Prior to Admission medications   Medication Sig Start Date End Date Taking? Authorizing Provider  albuterol (PROVENTIL HFA;VENTOLIN HFA) 108 (90 Base) MCG/ACT inhaler Inhale 2 puffs into the lungs every 6 (six) hours as needed for wheezing or shortness of breath. 03/02/18   Gouru, Deanna Artis, MD  albuterol (PROVENTIL) (2.5 MG/3ML) 0.083% nebulizer solution Take 3 mLs  (2.5 mg total) by nebulization every 4 (four) hours as needed for wheezing or shortness of breath. 03/02/18   Gouru, Deanna Artis, MD  atorvastatin (LIPITOR) 40 MG tablet Take 40 mg by mouth daily.    [provider]  carvedilol (COREG) 25 MG tablet Take 25 mg by mouth 2 (two) times daily.    [provider]  Fluticasone-Salmeterol (ADVAIR DISKUS) 100-50 MCG/DOSE AEPB Inhale 1 puff into the lungs 2 (two) times daily. 03/02/18 03/02/19  Ramonita Lab, MD  gabapentin (NEURONTIN) 300 MG capsule Take 1 capsule (300 mg total) by mouth 2 (two) times daily. 03/02/18   Gouru, Deanna Artis, MD  glipiZIDE (GLUCOTROL) 10 MG tablet Take 10 mg by mouth daily before breakfast.    [provider]  guaiFENesin-dextromethorphan (ROBITUSSIN DM) 100-10 MG/5ML syrup Take 5 mLs by mouth every 4 (four) hours as needed for cough. 03/02/18   Gouru, Deanna Artis, MD  latanoprost (XALATAN) 0.005 % ophthalmic solution Place 1 drop into both eyes at bedtime.    [provider]  lisinopril-hydrochlorothiazide (PRINZIDE,ZESTORETIC) 20-25 MG tablet Take 1 tablet by mouth daily.    [provider]  metFORMIN (GLUCOPHAGE) 1000 MG tablet Take 2,000 mg by mouth daily after supper.     [provider]  predniSONE (STERAPRED UNI-PAK 21 TAB) 10 MG (21) TBPK tablet 6 tabs PO x 1 day 5 tabs PO x 2 days 4 tabs PO x 2 days 3 tabs PO x 2 days 2 tabs PO x 2 days 1  tab PO x 2 days and stop 03/02/18   Ramonita Lab, MD  tiotropium (SPIRIVA) 18 MCG inhalation capsule Place 1 capsule (18 mcg total) into inhaler and inhale daily. 03/02/18   Ramonita Lab, MD    Allergies Patient has no known allergies.  Family History  Problem Relation Age of Onset  . Pancreatic cancer Sister     Social History Social History   Tobacco Use  . Smoking status: Never Smoker  . Smokeless tobacco: Never Used  Substance Use Topics  . Alcohol use: No  . Drug use: No    Review of Systems  Constitutional: No fever/chills Eyes: No  visual changes. ENT: No sore throat. Cardiovascular: Denies chest pain. Respiratory: Denies shortness of breath. Gastrointestinal: No abdominal pain.  No nausea, no vomiting.  No diarrhea.  No constipation. Genitourinary: Negative for dysuria. Musculoskeletal: Negative for back pain. Skin: Negative for rash. Neurological: Negative for headaches, focal weakness or numbness.   ____________________________________________   PHYSICAL EXAM:  VITAL SIGNS: ED Triage Vitals  Enc Vitals Group     BP 03/08/18 2018 (!) 94/45     Pulse Rate 03/08/18 2018 73     Resp 03/08/18 2018 16     Temp 03/08/18 2018 (!) 97.5 F (36.4 C)     Temp Source 03/08/18 2018 Oral     SpO2 03/08/18 2018 94 %     Weight 03/08/18 2019 279 lb 15.8 oz (127 kg)     Height 03/08/18 2019 5\' 2"  (1.575 m)     Head Circumference --      Peak Flow --      Pain Score 03/08/18 2019 10     Pain Loc --      Pain Edu? --      Excl. in GC? --     Constitutional: Alert and oriented. Well appearing and in no acute distress. Eyes: Conjunctivae are normal.  Head: Atraumatic. Nose: No congestion/rhinnorhea. Mouth/Throat: Mucous membranes are moist.  Neck: No stridor.  Tenderness diffusely to the bilateral trapezius region as well as the midline cervical spine.  No deformity or step-off. Cardiovascular: Normal rate, regular rhythm. Grossly normal heart sounds.  Good peripheral circulation with equal and bilateral radial as well as dorsalis pedis pulses. Respiratory: Normal respiratory effort.  No retractions. Lungs CTAB. Gastrointestinal: Soft and nontender. No distention. No CVA tenderness. Musculoskeletal: No lower extremity tenderness nor edema.  No joint effusions. Neurologic:  Normal speech and language. No gross focal neurologic deficits are appreciated. Skin:  Skin is warm, dry and intact. No rash noted. Psychiatric: Mood and affect are normal. Speech and behavior are  normal.  ____________________________________________   LABS (all labs ordered are listed, but only abnormal results are displayed)  Labs Reviewed  GLUCOSE, CAPILLARY - Abnormal; Notable for the following components:      Result Value   Glucose-Capillary 183 (*)    All other components within normal limits  BASIC METABOLIC PANEL - Abnormal; Notable for the following components:   Glucose, Bld 195 (*)    BUN 33 (*)    Creatinine, Ser 1.50 (*)    GFR calc non Af Amer 36 (*)    GFR calc Af Amer 42 (*)    All other components within normal limits  CBC - Abnormal; Notable for the following components:   WBC 16.4 (*)    All other components within normal limits  TROPONIN I  URINALYSIS, COMPLETE (UACMP) WITH MICROSCOPIC  CBG MONITORING, ED   ____________________________________________  EKG  ED ECG REPORT I, Arelia Longestavid M Ezri Landers, the attending physician, personally viewed and interpreted this ECG.   Date: 03/08/2018  EKG Time: 2029  Rate: 75  Rhythm: normal sinus rhythm  Axis: Normal  Intervals:left anterior fascicular block  ST&T Change: No ST segment elevation or depression.  No abnormal T wave inversion.  ____________________________________________  RADIOLOGY  Chest x-ray with mild cardiac enlargement without evidence of active pulmonary disease.  CT without acute intracranial abnormality.  Cervical spondylolysis.  No cervical spine acute injury. ____________________________________________   PROCEDURES  Procedure(s) performed:   Procedures  Critical Care performed:   ____________________________________________   INITIAL IMPRESSION / ASSESSMENT AND PLAN / ED COURSE  Pertinent labs & imaging results that were available during my care of the patient were reviewed by me and considered in my medical decision making (see chart for details).  DDX: Cervical spine fracture, intracranial hemorrhage, vasovagal syncope, aortic dissection, dehydration, hypotension,  renal failure, electrolyte abnormality, arrhythmia As part of my medical decision making, I reviewed the following data within the electronic MEDICAL RECORD NUMBER Notes from prior ED visits  Patient recently admitted to the hospital for asthma and discharged several days ago.  ----------------------------------------- 11:32 PM on 03/08/2018 -----------------------------------------  Patient does not appear to have any changes in home hypertension meds.  Reviewed the discharge summary from March 02, 2018 which shows that she has been continued on her home hypertension meds.  Patient appears to have a prerenal rate pattern on her metabolic panel.  Will give 2 L of fluid and reassess.  Plan is for after reassessment to check blood pressure for return to baseline.  Pending urinalysis still at this time.  Signed out to Dr. Manson PasseyBrown.  Do not suspect aortic catastrophe as the patient has equal bilateral radial as well as dorsalis pedis pulses.  No chest pain.  Neck pain appears somewhat chronic as she says that she has had a similar issue in the past which caused syncope in the past. ____________________________________________   FINAL CLINICAL IMPRESSION(S) / ED DIAGNOSES  Syncope.  Neck pain.  NEW MEDICATIONS STARTED DURING THIS VISIT:  New Prescriptions   No medications on file     Note:  This document was prepared using Dragon voice recognition software and may include unintentional dictation errors.     Myrna BlazerSchaevitz, Tuyen Uncapher Matthew, MD 03/08/18 [phone]

## 2018-03-08 NOTE — ED Triage Notes (Signed)
Pt comes to ER from home via EMS reports she was walking to bathroom felt dizzy and passed out, reports she lives with her husband and does not know how long she passed out for reports neck pain. Pt has been having diarrhea for a couple of days.

## 2018-03-09 LAB — URINALYSIS, COMPLETE (UACMP) WITH MICROSCOPIC
Bilirubin Urine: NEGATIVE
Glucose, UA: NEGATIVE mg/dL
Hgb urine dipstick: NEGATIVE
Ketones, ur: NEGATIVE mg/dL
Nitrite: NEGATIVE
PH: 5 (ref 5.0–8.0)
Protein, ur: NEGATIVE mg/dL
Specific Gravity, Urine: 1.02 (ref 1.005–1.030)

## 2018-03-13 ENCOUNTER — Other Ambulatory Visit: Payer: Self-pay | Admitting: Obstetrics and Gynecology

## 2018-03-13 DIAGNOSIS — J45998 Other asthma: Secondary | ICD-10-CM

## 2018-03-13 DIAGNOSIS — R55 Syncope and collapse: Secondary | ICD-10-CM

## 2018-03-13 DIAGNOSIS — J45909 Unspecified asthma, uncomplicated: Secondary | ICD-10-CM

## 2018-03-20 ENCOUNTER — Ambulatory Visit
Admission: RE | Admit: 2018-03-20 | Discharge: 2018-03-20 | Disposition: A | Discharge status: Home / Self Care | Payer: Medicaid Other | Source: Ambulatory Visit | Attending: Obstetrics and Gynecology | Admitting: Obstetrics and Gynecology

## 2018-03-20 DIAGNOSIS — J45998 Other asthma: Secondary | ICD-10-CM | POA: Diagnosis present

## 2018-03-20 DIAGNOSIS — R55 Syncope and collapse: Secondary | ICD-10-CM | POA: Insufficient documentation

## 2018-03-22 ENCOUNTER — Ambulatory Visit: Payer: Medicaid Other | Attending: Obstetrics and Gynecology

## 2018-03-22 DIAGNOSIS — J45909 Unspecified asthma, uncomplicated: Secondary | ICD-10-CM | POA: Insufficient documentation

## 2018-03-22 DIAGNOSIS — R55 Syncope and collapse: Secondary | ICD-10-CM | POA: Insufficient documentation

## 2018-03-22 MED ORDER — ALBUTEROL SULFATE (2.5 MG/3ML) 0.083% IN NEBU
2.5000 mg | INHALATION_SOLUTION | Freq: Once | RESPIRATORY_TRACT | Status: AC
  Administered 2018-03-22: 2.5 mg via RESPIRATORY_TRACT
  Filled 2018-03-22: qty 3

## 2018-04-16 ENCOUNTER — Other Ambulatory Visit: Payer: Self-pay | Admitting: Internal Medicine

## 2018-04-16 DIAGNOSIS — R0602 Shortness of breath: Secondary | ICD-10-CM

## 2018-04-17 ENCOUNTER — Other Ambulatory Visit: Payer: Self-pay | Admitting: Internal Medicine

## 2018-04-17 DIAGNOSIS — R0602 Shortness of breath: Secondary | ICD-10-CM

## 2018-04-17 DIAGNOSIS — I480 Paroxysmal atrial fibrillation: Secondary | ICD-10-CM

## 2018-04-20 ENCOUNTER — Encounter: Payer: Self-pay | Admitting: Emergency Medicine

## 2018-04-20 ENCOUNTER — Emergency Department: Payer: Medicaid Other

## 2018-04-20 ENCOUNTER — Inpatient Hospital Stay
Admission: EM | Admit: 2018-04-20 | Discharge: 2018-04-23 | DRG: 872 | Disposition: A | Discharge status: Home / Self Care | Payer: Medicaid Other | Attending: Internal Medicine | Admitting: Internal Medicine

## 2018-04-20 DIAGNOSIS — I10 Essential (primary) hypertension: Secondary | ICD-10-CM | POA: Diagnosis present

## 2018-04-20 DIAGNOSIS — G43909 Migraine, unspecified, not intractable, without status migrainosus: Secondary | ICD-10-CM | POA: Diagnosis present

## 2018-04-20 DIAGNOSIS — I4891 Unspecified atrial fibrillation: Secondary | ICD-10-CM | POA: Diagnosis present

## 2018-04-20 DIAGNOSIS — Z833 Family history of diabetes mellitus: Secondary | ICD-10-CM

## 2018-04-20 DIAGNOSIS — Z7951 Long term (current) use of inhaled steroids: Secondary | ICD-10-CM

## 2018-04-20 DIAGNOSIS — Z7901 Long term (current) use of anticoagulants: Secondary | ICD-10-CM

## 2018-04-20 DIAGNOSIS — B9620 Unspecified Escherichia coli [E. coli] as the cause of diseases classified elsewhere: Secondary | ICD-10-CM | POA: Diagnosis present

## 2018-04-20 DIAGNOSIS — N390 Urinary tract infection, site not specified: Secondary | ICD-10-CM

## 2018-04-20 DIAGNOSIS — Z6841 Body Mass Index (BMI) 40.0 and over, adult: Secondary | ICD-10-CM

## 2018-04-20 DIAGNOSIS — E1140 Type 2 diabetes mellitus with diabetic neuropathy, unspecified: Secondary | ICD-10-CM | POA: Diagnosis present

## 2018-04-20 DIAGNOSIS — Z86711 Personal history of pulmonary embolism: Secondary | ICD-10-CM

## 2018-04-20 DIAGNOSIS — E872 Acidosis: Secondary | ICD-10-CM | POA: Diagnosis present

## 2018-04-20 DIAGNOSIS — N136 Pyonephrosis: Secondary | ICD-10-CM | POA: Diagnosis present

## 2018-04-20 DIAGNOSIS — Z800 Family history of malignant neoplasm of digestive organs: Secondary | ICD-10-CM

## 2018-04-20 DIAGNOSIS — Z79899 Other long term (current) drug therapy: Secondary | ICD-10-CM

## 2018-04-20 DIAGNOSIS — R109 Unspecified abdominal pain: Secondary | ICD-10-CM

## 2018-04-20 DIAGNOSIS — A419 Sepsis, unspecified organism: Principal | ICD-10-CM | POA: Diagnosis present

## 2018-04-20 DIAGNOSIS — Z23 Encounter for immunization: Secondary | ICD-10-CM

## 2018-04-20 DIAGNOSIS — Z7984 Long term (current) use of oral hypoglycemic drugs: Secondary | ICD-10-CM

## 2018-04-20 LAB — URINALYSIS, COMPLETE (UACMP) WITH MICROSCOPIC
Bilirubin Urine: NEGATIVE
Glucose, UA: NEGATIVE mg/dL
Ketones, ur: NEGATIVE mg/dL
Nitrite: POSITIVE — AB
Protein, ur: 30 mg/dL — AB
RBC / HPF: >50 RBC/hpf — ABNORMAL HIGH (ref 0–5)
SQUAMOUS EPITHELIAL / LPF: NONE SEEN (ref 0–5)
Specific Gravity, Urine: 1.014 (ref 1.005–1.030)
WBC, UA: >50 WBC/hpf — ABNORMAL HIGH (ref 0–5)
pH: 5 (ref 5.0–8.0)

## 2018-04-20 LAB — CBC
HCT: 43.6 % (ref 36.0–46.0)
Hemoglobin: 13.8 g/dL (ref 12.0–15.0)
MCH: 30.5 pg (ref 26.0–34.0)
MCHC: 31.7 g/dL (ref 30.0–36.0)
MCV: 96.5 fL (ref 80.0–100.0)
NRBC: 0 % (ref 0.0–0.2)
Platelets: 265 10*3/uL (ref 150–400)
RBC: 4.52 MIL/uL (ref 3.87–5.11)
RDW: 13.6 % (ref 11.5–15.5)
WBC: 13 10*3/uL — ABNORMAL HIGH (ref 4.0–10.5)

## 2018-04-20 LAB — BASIC METABOLIC PANEL
ANION GAP: 10 (ref 5–15)
BUN: 27 mg/dL — ABNORMAL HIGH (ref 8–23)
CALCIUM: 9.4 mg/dL (ref 8.9–10.3)
CO2: 22 mmol/L (ref 22–32)
CREATININE: 0.97 mg/dL (ref 0.44–1.00)
Chloride: 107 mmol/L (ref 98–111)
GFR calc Af Amer: >60 mL/min (ref >60)
GFR calc non Af Amer: >60 mL/min (ref >60)
Glucose, Bld: 176 mg/dL — ABNORMAL HIGH (ref 70–99)
Potassium: 4.3 mmol/L (ref 3.5–5.1)
Sodium: 139 mmol/L (ref 135–145)

## 2018-04-20 MED ORDER — KETOROLAC TROMETHAMINE 30 MG/ML IJ SOLN
10.0000 mg | Freq: Once | INTRAMUSCULAR | Status: AC
  Administered 2018-04-21: 9.9 mg via INTRAVENOUS
  Filled 2018-04-20: qty 1

## 2018-04-20 MED ORDER — ONDANSETRON HCL 4 MG/2ML IJ SOLN
4.0000 mg | Freq: Once | INTRAMUSCULAR | Status: AC
  Administered 2018-04-21: 4 mg via INTRAVENOUS
  Filled 2018-04-20: qty 2

## 2018-04-20 MED ORDER — SODIUM CHLORIDE 0.9 % IV BOLUS
1000.0000 mL | Freq: Once | INTRAVENOUS | Status: AC
  Administered 2018-04-21: 1000 mL via INTRAVENOUS

## 2018-04-20 MED ORDER — SODIUM CHLORIDE 0.9 % IV SOLN
1.0000 g | Freq: Once | INTRAVENOUS | Status: AC
  Administered 2018-04-21: 1 g via INTRAVENOUS
  Filled 2018-04-20: qty 10

## 2018-04-20 NOTE — ED Triage Notes (Signed)
Pt reports she has had right sided flank that radiates into lower abdomin as well as feeling "hot." Pt denies medication taken at home for symptoms as well as denies urinary symptoms.

## 2018-04-20 NOTE — ED Notes (Signed)
BMP Re-drawn by this EDT.

## 2018-04-20 NOTE — ED Provider Notes (Signed)
[facility] Emergency Department Provider Note   ____________________________________________   First MD Initiated Contact with Patient 04/20/18 2316     (approximate)  I have reviewed the triage vital signs and the nursing notes.   HISTORY  Chief Complaint Flank Pain; Abdominal Pain; and Fever    HPI Wendy Summers is a 68 y.o. female who presents to the ED from home with a chief complaint of right flank plain.   Patient reports a 1 day history of right flank pain radiating into her right lower abdomen.  States her abdomen feels "hot".  Endorses nausea without vomiting.  Denies associated fever, chills, chest pain, shortness of breath, cough, dysuria, diarrhea.  Denies recent travel or trauma.   Past Medical History:  Diagnosis Date  . Asthma   . Bronchitis   . Diabetes mellitus without complication (HCC)   . Migraine     Patient Active Problem List   Diagnosis Date Noted  . Acute asthma exacerbation 02/25/2018  . Asthmatic bronchitis 07/14/2016  . Near syncope 07/13/2016    Past Surgical History:  Procedure Laterality Date  . CHOLECYSTECTOMY      Prior to Admission medications   Medication Sig Start Date End Date Taking? Authorizing Provider  albuterol (PROVENTIL HFA;VENTOLIN HFA) 108 (90 Base) MCG/ACT inhaler Inhale 2 puffs into the lungs every 6 (six) hours as needed for wheezing or shortness of breath. 03/02/18   Gouru, Deanna ArtisAruna, MD  albuterol (PROVENTIL) (2.5 MG/3ML) 0.083% nebulizer solution Take 3 mLs (2.5 mg total) by nebulization every 4 (four) hours as needed for wheezing or shortness of breath. 03/02/18   Gouru, Deanna ArtisAruna, MD  atorvastatin (LIPITOR) 40 MG tablet Take 40 mg by mouth daily.    [provider]  carvedilol (COREG) 25 MG tablet Take 25 mg by mouth 2 (two) times daily.    [provider]  Fluticasone-Salmeterol (ADVAIR DISKUS) 100-50 MCG/DOSE AEPB Inhale 1 puff into the lungs 2 (two) times daily. 03/02/18 03/02/19   Ramonita LabGouru, Aruna, MD  gabapentin (NEURONTIN) 300 MG capsule Take 1 capsule (300 mg total) by mouth 2 (two) times daily. 03/02/18   Gouru, Deanna ArtisAruna, MD  glipiZIDE (GLUCOTROL) 10 MG tablet Take 10 mg by mouth daily before breakfast.    [provider]  guaiFENesin-dextromethorphan (ROBITUSSIN DM) 100-10 MG/5ML syrup Take 5 mLs by mouth every 4 (four) hours as needed for cough. 03/02/18   Gouru, Deanna ArtisAruna, MD  latanoprost (XALATAN) 0.005 % ophthalmic solution Place 1 drop into both eyes at bedtime.    [provider]  lisinopril-hydrochlorothiazide (PRINZIDE,ZESTORETIC) 20-25 MG tablet Take 1 tablet by mouth daily.    [provider]  metFORMIN (GLUCOPHAGE) 1000 MG tablet Take 2,000 mg by mouth daily after supper.     [provider]  predniSONE (STERAPRED UNI-PAK 21 TAB) 10 MG (21) TBPK tablet 6 tabs PO x 1 day 5 tabs PO x 2 days 4 tabs PO x 2 days 3 tabs PO x 2 days 2 tabs PO x 2 days 1 tab PO x 2 days and stop 03/02/18   Ramonita LabGouru, Aruna, MD  tiotropium (SPIRIVA) 18 MCG inhalation capsule Place 1 capsule (18 mcg total) into inhaler and inhale daily. 03/02/18   Ramonita LabGouru, Aruna, MD    Allergies Patient has no known allergies.  Family History  Problem Relation Age of Onset  . Pancreatic cancer Sister     Social History Social History   Tobacco Use  . Smoking status: Never Smoker  . Smokeless tobacco:  Never Used  Substance Use Topics  . Alcohol use: No  . Drug use: No    Review of Systems  Constitutional: No fever/chills Eyes: No visual changes. ENT: No sore throat. Cardiovascular: Denies chest pain. Respiratory: Denies shortness of breath. Gastrointestinal: Positive for right flank to right lower abdominal pain.  Positive for nausea, no vomiting.  No diarrhea.  No constipation. Genitourinary: Negative for dysuria. Musculoskeletal: Negative for back pain. Skin: Negative for rash. Neurological: Negative for headaches, focal weakness or  numbness.   ____________________________________________   PHYSICAL EXAM:  VITAL SIGNS: ED Triage Vitals  Enc Vitals Group     BP 04/20/18 1956 (!) 152/62     Pulse Rate 04/20/18 1956 87     Resp 04/20/18 1956 18     Temp 04/20/18 1956 98.2 F (36.8 C)     Temp Source 04/20/18 1956 Oral     SpO2 04/20/18 1956 97 %     Weight 04/20/18 1956 236 lb (107 kg)     Height --      Head Circumference --      Peak Flow --      Pain Score 04/20/18 2248 8     Pain Loc --      Pain Edu? --      Excl. in GC? --     Constitutional: Alert and oriented. Well appearing and in no acute distress. Eyes: Conjunctivae are normal. PERRL. EOMI. Head: Atraumatic. Nose: No congestion/rhinnorhea. Mouth/Throat: Mucous membranes are moist.  Oropharynx non-erythematous. Neck: No stridor.   Cardiovascular: Normal rate, regular rhythm. Grossly normal heart sounds.  Good peripheral circulation. Respiratory: Normal respiratory effort.  No retractions. Lungs CTAB. Gastrointestinal: Soft and nontender to light or deep palpation. No distention. No abdominal bruits.  Mild right CVA tenderness. Musculoskeletal: No lower extremity tenderness nor edema.  No joint effusions. Neurologic:  Normal speech and language. No gross focal neurologic deficits are appreciated. No gait instability. Skin:  Skin is warm, dry and intact. No rash noted.  No petechiae. Psychiatric: Mood and affect are normal. Speech and behavior are normal.  ____________________________________________   LABS (all labs ordered are listed, but only abnormal results are displayed)  Labs Reviewed  URINALYSIS, COMPLETE (UACMP) WITH MICROSCOPIC - Abnormal; Notable for the following components:      Result Value   Color, Urine YELLOW (*)    APPearance CLOUDY (*)    Hgb urine dipstick LARGE (*)    Protein, ur 30 (*)    Nitrite POSITIVE (*)    Leukocytes,Ua LARGE (*)    RBC / HPF >50 (*)    WBC, UA >50 (*)    Bacteria, UA FEW (*)    All  other components within normal limits  CBC - Abnormal; Notable for the following components:   WBC 13.0 (*)    All other components within normal limits  BASIC METABOLIC PANEL - Abnormal; Notable for the following components:   Glucose, Bld 176 (*)    BUN 27 (*)    All other components within normal limits  LACTIC ACID, PLASMA - Abnormal; Notable for the following components:   Lactic Acid, Venous 2.5 (*)    All other components within normal limits  CULTURE, BLOOD (ROUTINE X 2)  CULTURE, BLOOD (ROUTINE X 2)  URINE CULTURE  LACTIC ACID, PLASMA   ____________________________________________  EKG  None ____________________________________________  RADIOLOGY  ED MD interpretation: Mild right perinephric stranding most likely infection  Official radiology report(s): Ct Renal Stone Study  Result  Date: 04/20/2018 CLINICAL DATA:  Initial evaluation for acute right-sided flank pain. EXAM: CT ABDOMEN AND PELVIS WITHOUT CONTRAST TECHNIQUE: Multidetector CT imaging of the abdomen and pelvis was performed following the standard protocol without IV contrast. COMPARISON:  Prior CT from 02/25/2018 FINDINGS: Lower chest: Visualized lung bases are clear. Hepatobiliary: Punctate calcification noted within left hepatic lobe. Limited noncontrast evaluation liver otherwise unremarkable. Gallbladder surgically absent. No biliary dilatation. Pancreas: Pancreas within normal limits. Spleen: Spleen within normal limits. Adrenals/Urinary Tract: Adrenal glands are normal. Left kidney unremarkable without nephrolithiasis or hydronephrosis. No radiopaque stones seen along the course of the left renal collecting system. No left-sided hydroureter. On the right, there is mild right hydroureteronephrosis. Associated right perinephric and periureteral fat stranding. No definite radiopaque stones seen along the course of the right renal collecting system. Punctate calcific densities seen adjacent to the distal right  ureter on axial images 70 and 71 felt to be vascular in nature. Finding raises the possibility for a recently passed stone. No other calculi seen within the right kidney. Bladder largely decompressed. No layering stones within the bladder lumen. Stomach/Bowel: Stomach within normal limits. No evidence for bowel obstruction. Normal appendix. No acute inflammatory changes seen about the bowels. Vascular/Lymphatic: Mild aortic atherosclerosis. No aneurysm. No adenopathy. Reproductive: Uterus and ovaries within normal limits for age. Other: No free air or fluid. Musculoskeletal: No acute osseous abnormality. No discrete lytic or blastic osseous lesions. IMPRESSION: 1. Mild right hydroureteronephrosis with associated perinephric and periureteral fat stranding. No obstructive radiopaque stone identified. Finding raises the possibility for a recently passed stone. Possible infection would be the primary differential consideration. Correlation with urinalysis recommended. 2. No other acute intra-abdominal or pelvic process. Electronically Signed   By: Rise Mu M.D.   On: 04/20/2018 23:45    ____________________________________________   PROCEDURES  Procedure(s) performed: None  Procedures  Critical Care performed:   CRITICAL CARE Performed by: Irean Hong   Total critical care time: 30 minutes  Critical care time was exclusive of separately billable procedures and treating other patients.  Critical care was necessary to treat or prevent imminent or life-threatening deterioration.  Critical care was time spent personally by me on the following activities: development of treatment plan with patient and/or surrogate as well as nursing, discussions with consultants, evaluation of patient's response to treatment, examination of patient, obtaining history from patient or surrogate, ordering and performing treatments and interventions, ordering and review of laboratory studies, ordering and  review of radiographic studies, pulse oximetry and re-evaluation of patient's condition.  ____________________________________________   INITIAL IMPRESSION / ASSESSMENT AND PLAN / ED COURSE  As part of my medical decision making, I reviewed the following data within the electronic MEDICAL RECORD NUMBER History obtained from family, Nursing notes reviewed and incorporated, Labs reviewed, Old chart reviewed, Radiograph reviewed and Notes from prior ED visits    68 year old female who presents with right flank pain. Differential diagnosis includes, but is not limited to, ovarian cyst, ovarian torsion, acute appendicitis, diverticulitis, urinary tract infection/pyelonephritis, endometriosis, bowel obstruction, colitis, renal colic, gastroenteritis, hernia, fibroids, etc.   Laboratory results remarkable for mild leukocytosis, nitrite and leukocyte positive UTI.  Patient does not recall ever having a kidney stone.  Will check blood cultures, lactate; start 1 g IV Rocephin, 10 mg IV Toradol for pain, paired with 4 mg IV Zofran for nausea.  Will obtain CT renal colic study to evaluate for obstructive kidney stone.   Clinical Course as of Apr 21 129  Sat Apr 21, 2018  0123 Noted elevated lactate.  Will call ED code sepsis.  Will discuss with hospitalist Dr. Anne Hahn to evaluate patient in the emergency department for admission.   [JS]    Clinical Course User Index [JS] Irean Hong, MD     ____________________________________________   FINAL CLINICAL IMPRESSION(S) / ED DIAGNOSES  Final diagnoses:  Flank pain  Lower urinary tract infectious disease  Sepsis, due to unspecified organism, unspecified whether acute organ dysfunction present [facility])     ED Discharge Orders    None       Note:  This document was prepared using Dragon voice recognition software and may include unintentional dictation errors.   Irean Hong, MD 04/21/18 [phone]

## 2018-04-21 ENCOUNTER — Other Ambulatory Visit: Payer: Self-pay

## 2018-04-21 ENCOUNTER — Encounter: Payer: Self-pay | Admitting: Internal Medicine

## 2018-04-21 DIAGNOSIS — Z7984 Long term (current) use of oral hypoglycemic drugs: Secondary | ICD-10-CM | POA: Diagnosis not present

## 2018-04-21 DIAGNOSIS — Z800 Family history of malignant neoplasm of digestive organs: Secondary | ICD-10-CM | POA: Diagnosis not present

## 2018-04-21 DIAGNOSIS — A419 Sepsis, unspecified organism: Secondary | ICD-10-CM | POA: Diagnosis present

## 2018-04-21 DIAGNOSIS — E872 Acidosis: Secondary | ICD-10-CM | POA: Diagnosis present

## 2018-04-21 DIAGNOSIS — B9620 Unspecified Escherichia coli [E. coli] as the cause of diseases classified elsewhere: Secondary | ICD-10-CM | POA: Diagnosis present

## 2018-04-21 DIAGNOSIS — E1140 Type 2 diabetes mellitus with diabetic neuropathy, unspecified: Secondary | ICD-10-CM | POA: Diagnosis present

## 2018-04-21 DIAGNOSIS — Z86711 Personal history of pulmonary embolism: Secondary | ICD-10-CM | POA: Diagnosis not present

## 2018-04-21 DIAGNOSIS — Z23 Encounter for immunization: Secondary | ICD-10-CM | POA: Diagnosis not present

## 2018-04-21 DIAGNOSIS — G43909 Migraine, unspecified, not intractable, without status migrainosus: Secondary | ICD-10-CM | POA: Diagnosis present

## 2018-04-21 DIAGNOSIS — Z79899 Other long term (current) drug therapy: Secondary | ICD-10-CM | POA: Diagnosis not present

## 2018-04-21 DIAGNOSIS — I10 Essential (primary) hypertension: Secondary | ICD-10-CM | POA: Diagnosis present

## 2018-04-21 DIAGNOSIS — Z7901 Long term (current) use of anticoagulants: Secondary | ICD-10-CM | POA: Diagnosis not present

## 2018-04-21 DIAGNOSIS — N136 Pyonephrosis: Secondary | ICD-10-CM | POA: Diagnosis present

## 2018-04-21 DIAGNOSIS — R509 Fever, unspecified: Secondary | ICD-10-CM | POA: Diagnosis present

## 2018-04-21 DIAGNOSIS — Z7951 Long term (current) use of inhaled steroids: Secondary | ICD-10-CM | POA: Diagnosis not present

## 2018-04-21 DIAGNOSIS — Z833 Family history of diabetes mellitus: Secondary | ICD-10-CM | POA: Diagnosis not present

## 2018-04-21 DIAGNOSIS — Z6841 Body Mass Index (BMI) 40.0 and over, adult: Secondary | ICD-10-CM | POA: Diagnosis not present

## 2018-04-21 DIAGNOSIS — I4891 Unspecified atrial fibrillation: Secondary | ICD-10-CM | POA: Diagnosis present

## 2018-04-21 LAB — GLUCOSE, CAPILLARY
Glucose-Capillary: 157 mg/dL — ABNORMAL HIGH (ref 70–99)
Glucose-Capillary: 153 mg/dL — ABNORMAL HIGH (ref 70–99)
Glucose-Capillary: 170 mg/dL — ABNORMAL HIGH (ref 70–99)
Glucose-Capillary: 205 mg/dL — ABNORMAL HIGH (ref 70–99)

## 2018-04-21 LAB — LACTIC ACID, PLASMA
Lactic Acid, Venous: 1.6 mmol/L (ref 0.5–1.9)
Lactic Acid, Venous: 2.5 mmol/L (ref 0.5–1.9)

## 2018-04-21 LAB — TSH: TSH: 2.859 u[IU]/mL (ref 0.350–4.500)

## 2018-04-21 MED ORDER — ACETAMINOPHEN 325 MG PO TABS
650.0000 mg | ORAL_TABLET | Freq: Four times a day (QID) | ORAL | Status: DC | PRN
  Administered 2018-04-21: 650 mg via ORAL
  Filled 2018-04-21: qty 2

## 2018-04-21 MED ORDER — LISINOPRIL 20 MG PO TABS
20.0000 mg | ORAL_TABLET | Freq: Every day | ORAL | Status: DC
  Administered 2018-04-21 – 2018-04-23 (×3): 20 mg via ORAL
  Filled 2018-04-21 (×3): qty 1

## 2018-04-21 MED ORDER — ACETAMINOPHEN 650 MG RE SUPP: 650.0000 mg | Freq: Four times a day (QID) | RECTAL | Status: DC | PRN

## 2018-04-21 MED ORDER — ONDANSETRON HCL 4 MG/2ML IJ SOLN: 4.0000 mg | Freq: Four times a day (QID) | INTRAMUSCULAR | Status: DC | PRN

## 2018-04-21 MED ORDER — SODIUM CHLORIDE 0.9 % IV BOLUS (SEPSIS): 1000.0000 mL | Freq: Once | INTRAVENOUS | Status: DC

## 2018-04-21 MED ORDER — SODIUM CHLORIDE 0.9 % IV BOLUS (SEPSIS)
1000.0000 mL | Freq: Once | INTRAVENOUS | Status: AC
  Administered 2018-04-21: 1000 mL via INTRAVENOUS

## 2018-04-21 MED ORDER — ENOXAPARIN SODIUM 40 MG/0.4ML ~~LOC~~ SOLN: 40.0000 mg | Freq: Two times a day (BID) | SUBCUTANEOUS | Status: DC

## 2018-04-21 MED ORDER — PNEUMOCOCCAL VAC POLYVALENT 25 MCG/0.5ML IJ INJ
0.5000 mL | INJECTION | INTRAMUSCULAR | Status: AC
  Administered 2018-04-22: 0.5 mL via INTRAMUSCULAR
  Filled 2018-04-21: qty 0.5

## 2018-04-21 MED ORDER — SODIUM CHLORIDE 0.9 % IV BOLUS (SEPSIS): 500.0000 mL | Freq: Once | INTRAVENOUS | Status: DC

## 2018-04-21 MED ORDER — GABAPENTIN 300 MG PO CAPS
300.0000 mg | ORAL_CAPSULE | Freq: Two times a day (BID) | ORAL | Status: DC
  Administered 2018-04-21 – 2018-04-23 (×5): 300 mg via ORAL
  Filled 2018-04-21 (×5): qty 1

## 2018-04-21 MED ORDER — ONDANSETRON HCL 4 MG PO TABS: 4.0000 mg | ORAL_TABLET | Freq: Four times a day (QID) | ORAL | Status: DC | PRN

## 2018-04-21 MED ORDER — ATORVASTATIN CALCIUM 20 MG PO TABS
40.0000 mg | ORAL_TABLET | Freq: Every day | ORAL | Status: DC
  Administered 2018-04-21 – 2018-04-23 (×3): 40 mg via ORAL
  Filled 2018-04-21 (×3): qty 2

## 2018-04-21 MED ORDER — AMLODIPINE BESYLATE 10 MG PO TABS
10.0000 mg | ORAL_TABLET | Freq: Every day | ORAL | Status: DC
  Administered 2018-04-21 – 2018-04-23 (×3): 10 mg via ORAL
  Filled 2018-04-21 (×3): qty 1

## 2018-04-21 MED ORDER — LATANOPROST 0.005 % OP SOLN
1.0000 [drp] | Freq: Every day | OPHTHALMIC | Status: DC
  Administered 2018-04-21 – 2018-04-22 (×2): 1 [drp] via OPHTHALMIC
  Filled 2018-04-21: qty 2.5

## 2018-04-21 MED ORDER — NYSTATIN 100000 UNIT/GM EX POWD
Freq: Two times a day (BID) | CUTANEOUS | Status: DC
  Administered 2018-04-21 – 2018-04-23 (×4): via TOPICAL
  Filled 2018-04-21: qty 15

## 2018-04-21 MED ORDER — SODIUM CHLORIDE 0.9 % IV SOLN
INTRAVENOUS | Status: DC
  Administered 2018-04-21 – 2018-04-22 (×2): via INTRAVENOUS

## 2018-04-21 MED ORDER — INSULIN ASPART 100 UNIT/ML ~~LOC~~ SOLN
0.0000 [IU] | Freq: Three times a day (TID) | SUBCUTANEOUS | Status: DC
  Administered 2018-04-21 – 2018-04-22 (×4): 2 [IU] via SUBCUTANEOUS
  Administered 2018-04-22: 3 [IU] via SUBCUTANEOUS
  Administered 2018-04-22 – 2018-04-23 (×2): 2 [IU] via SUBCUTANEOUS
  Filled 2018-04-21 (×7): qty 1

## 2018-04-21 MED ORDER — DOCUSATE SODIUM 100 MG PO CAPS
100.0000 mg | ORAL_CAPSULE | Freq: Two times a day (BID) | ORAL | Status: DC
  Administered 2018-04-21 – 2018-04-23 (×5): 100 mg via ORAL
  Filled 2018-04-21 (×5): qty 1

## 2018-04-21 MED ORDER — INSULIN ASPART 100 UNIT/ML ~~LOC~~ SOLN
0.0000 [IU] | Freq: Every day | SUBCUTANEOUS | Status: DC
  Administered 2018-04-21: 2 [IU] via SUBCUTANEOUS
  Filled 2018-04-21: qty 1

## 2018-04-21 MED ORDER — SODIUM CHLORIDE 0.9 % IV SOLN
1.0000 g | INTRAVENOUS | Status: DC
  Administered 2018-04-21 – 2018-04-22 (×2): 1 g via INTRAVENOUS
  Filled 2018-04-21 (×2): qty 1
  Filled 2018-04-21: qty 10

## 2018-04-21 MED ORDER — APIXABAN 5 MG PO TABS
5.0000 mg | ORAL_TABLET | Freq: Every day | ORAL | Status: DC
  Administered 2018-04-21 – 2018-04-23 (×3): 5 mg via ORAL
  Filled 2018-04-21 (×3): qty 1

## 2018-04-21 MED ORDER — METOPROLOL TARTRATE 50 MG PO TABS
100.0000 mg | ORAL_TABLET | Freq: Two times a day (BID) | ORAL | Status: DC
  Administered 2018-04-21 – 2018-04-22 (×4): 100 mg via ORAL
  Filled 2018-04-21 (×4): qty 2

## 2018-04-21 NOTE — Progress Notes (Signed)
CODE SEPSIS - PHARMACY COMMUNICATION  **Broad Spectrum Antibiotics should be administered within 1 hour of Sepsis diagnosis**  Time Code Sepsis Called/Page Received: 0222 0124  Antibiotics Ordered: 0221 2324  Time of 1st antibiotic administration: 0222 0006  Additional action taken by pharmacy:   If necessary, Name of Provider/Nurse Contacted:     Erich Montane ,PharmD Clinical Pharmacist  04/21/2018  1:50 AM

## 2018-04-21 NOTE — Progress Notes (Signed)
Advanced care plan.  Purpose of the Encounter: CODE STATUS  Parties in Attendance: Patient herself  Patient's Decision Capacity: Intact  Subjective/Patient's story: Patient 68 year old with history of pulmonary embolism, atrial fibrillation, diabetes type 2 presenting with sepsis   Objective/Medical story  I discussed with the patient regarding her desires for cardiac and pulmonary resuscitation  Goals of care determination:  Patient states that she would like to be a full code   CODE STATUS: Full code   Time spent discussing advanced care planning: 16 minutes

## 2018-04-21 NOTE — Progress Notes (Signed)
Patient seen and examined Admitted with sepsis with UTI History of congestive heart failure we will decrease IV fluid rate, continue IV antibiotics

## 2018-04-21 NOTE — H&P (Signed)
Wendy Summers is an 68 y.o. female.   Chief Complaint: Abdominal pain HPI: The patient with past medical history of diabetes as well as atrial fibrillation presents to the emergency department complaining of abdominal pain.  The patient reports that the pain is in her central lower abdomen and radiates to her back.  Her back began hurting today but the patient has had urinary symptoms for a few days.  She complains of urinary hesitancy as well as subjective fever.  Urinalysis was consistent with UTI.  Patient received ceftriaxone in the emergency department prior to the hospitalist service being called for admission.  Past Medical History:  Diagnosis Date  . Asthma   . Bronchitis   . Diabetes mellitus without complication (HCC)   . Migraine     Past Surgical History:  Procedure Laterality Date  . CHOLECYSTECTOMY      Family History  Problem Relation Age of Onset  . Diabetes Mellitus II Mother   . Pancreatic cancer Sister    Social History:  reports that she has never smoked. She has never used smokeless tobacco. She reports that she does not drink alcohol or use drugs.  Allergies: No Known Allergies  Medications Prior to Admission  Medication Sig Dispense Refill  . amLODipine (NORVASC) 10 MG tablet Take 10 mg by mouth daily. for high blood pressure    . apixaban (ELIQUIS) 5 MG TABS tablet Take 5 mg by mouth daily.    Marland Kitchen atorvastatin (LIPITOR) 40 MG tablet Take 40 mg by mouth daily.    Marland Kitchen gabapentin (NEURONTIN) 300 MG capsule Take 1 capsule (300 mg total) by mouth 2 (two) times daily. 60 capsule 0  . glipiZIDE (GLUCOTROL) 10 MG tablet Take 10 mg by mouth daily before breakfast.    . guaiFENesin-dextromethorphan (ROBITUSSIN DM) 100-10 MG/5ML syrup Take 5 mLs by mouth every 4 (four) hours as needed for cough. 118 mL 0  . latanoprost (XALATAN) 0.005 % ophthalmic solution Place 1 drop into both eyes at bedtime.    Marland Kitchen lisinopril (PRINIVIL,ZESTRIL) 20 MG tablet Take 20 mg by mouth daily. for  high blood pressure    . metFORMIN (GLUCOPHAGE-XR) 500 MG 24 hr tablet Take 2,000 mg by mouth daily.    . metoprolol tartrate (LOPRESSOR) 100 MG tablet Take 100 mg by mouth 2 (two) times daily.    Marland Kitchen SIMBRINZA 1-0.2 % SUSP Apply 1 drop to eye 2 (two) times daily.       Results for orders placed or performed during the hospital encounter of 04/20/18 (from the past 48 hour(s))  Urinalysis, Complete w Microscopic     Status: Abnormal   Collection Time: 04/20/18  8:02 PM  Result Value Ref Range   Color, Urine YELLOW (A) YELLOW   APPearance CLOUDY (A) CLEAR   Specific Gravity, Urine 1.014 1.005 - 1.030   pH 5.0 5.0 - 8.0   Glucose, UA NEGATIVE NEGATIVE mg/dL   Hgb urine dipstick LARGE (A) NEGATIVE   Bilirubin Urine NEGATIVE NEGATIVE   Ketones, ur NEGATIVE NEGATIVE mg/dL   Protein, ur 30 (A) NEGATIVE mg/dL   Nitrite POSITIVE (A) NEGATIVE   Leukocytes,Ua LARGE (A) NEGATIVE   RBC / HPF >50 (H) 0 - 5 RBC/hpf   WBC, UA >50 (H) 0 - 5 WBC/hpf   Bacteria, UA FEW (A) NONE SEEN   Squamous Epithelial / LPF NONE SEEN 0 - 5   WBC Clumps PRESENT     Comment: Performed at [facility], [address]  [place], [place] [address]  CBC     Status: Abnormal   Collection Time: 04/20/18  8:02 PM  Result Value Ref Range   WBC 13.0 (H) 4.0 - 10.5 K/uL   RBC 4.52 3.87 - 5.11 MIL/uL   Hemoglobin 13.8 12.0 - 15.0 g/dL   HCT 37.0 96.4 - 38.3 %   MCV 96.5 80.0 - 100.0 fL   MCH 30.5 26.0 - 34.0 pg   MCHC 31.7 30.0 - 36.0 g/dL   RDW 81.8 40.3 - 75.4 %   Platelets 265 150 - 400 K/uL   nRBC 0.0 0.0 - 0.2 %    Comment: Performed at [facility], [address] [place], [place] [address]  Basic metabolic panel     Status: Abnormal   Collection Time: 04/20/18  8:58 PM  Result Value Ref Range   Sodium 139 135 - 145 mmol/L   Potassium 4.3 3.5 - 5.1 mmol/L   Chloride 107 98 - 111 mmol/L   CO2 22 22 - 32 mmol/L   Glucose, Bld 176 (H) 70 - 99 mg/dL   BUN 27 (H) 8 - 23 mg/dL   Creatinine, Ser  7.03 0.44 - 1.00 mg/dL   Calcium 9.4 8.9 - 40.3 mg/dL   GFR calc non Af Amer >60 >60 mL/min   GFR calc Af Amer >60 >60 mL/min   Anion gap 10 5 - 15    Comment: Performed at [facility], [address] [place], [place] [address]  TSH     Status: None   Collection Time: 04/20/18  8:58 PM  Result Value Ref Range   TSH 2.859 0.350 - 4.500 uIU/mL    Comment: Performed by a 3rd Generation assay with a functional sensitivity of <=0.01 uIU/mL. Performed at [facility], [address] [place], [place] [address]   Lactic acid, plasma     Status: Abnormal   Collection Time: 04/21/18 12:11 AM  Result Value Ref Range   Lactic Acid, Venous 2.5 (HH) 0.5 - 1.9 mmol/L    Comment: CRITICAL RESULT CALLED TO, READ BACK BY AND VERIFIED WITH GRACIE Allan 04/21/18 @ 0041  MLK Performed at [facility], [address] [place], [place] [address]   Lactic acid, plasma     Status: None   Collection Time: 04/21/18  2:12 AM  Result Value Ref Range   Lactic Acid, Venous 1.6 0.5 - 1.9 mmol/L    Comment: Performed at [facility], [address] [place], [place] [address]   Ct Renal Larina Bras Study  Result Date: 04/20/2018 CLINICAL DATA:  Initial evaluation for acute right-sided flank pain. EXAM: CT ABDOMEN AND PELVIS WITHOUT CONTRAST TECHNIQUE: Multidetector CT imaging of the abdomen and pelvis was performed following the standard protocol without IV contrast. COMPARISON:  Prior CT from 02/25/2018 FINDINGS: Lower chest: Visualized lung bases are clear. Hepatobiliary: Punctate calcification noted within left hepatic lobe. Limited noncontrast evaluation liver otherwise unremarkable. Gallbladder surgically absent. No biliary dilatation. Pancreas: Pancreas within normal limits. Spleen: Spleen within normal limits. Adrenals/Urinary Tract: Adrenal glands are normal. Left kidney unremarkable without nephrolithiasis or hydronephrosis. No radiopaque stones seen along the course of the  left renal collecting system. No left-sided hydroureter. On the right, there is mild right hydroureteronephrosis. Associated right perinephric and periureteral fat stranding. No definite radiopaque stones seen along the course of the right renal collecting system. Punctate calcific densities seen adjacent to the distal right ureter on axial images 70 and 71 felt to be vascular in  nature. Finding raises the possibility for a recently passed stone. No other calculi seen within the right kidney. Bladder largely decompressed. No layering stones within the bladder lumen. Stomach/Bowel: Stomach within normal limits. No evidence for bowel obstruction. Normal appendix. No acute inflammatory changes seen about the bowels. Vascular/Lymphatic: Mild aortic atherosclerosis. No aneurysm. No adenopathy. Reproductive: Uterus and ovaries within normal limits for age. Other: No free air or fluid. Musculoskeletal: No acute osseous abnormality. No discrete lytic or blastic osseous lesions. IMPRESSION: 1. Mild right hydroureteronephrosis with associated perinephric and periureteral fat stranding. No obstructive radiopaque stone identified. Finding raises the possibility for a recently passed stone. Possible infection would be the primary differential consideration. Correlation with urinalysis recommended. 2. No other acute intra-abdominal or pelvic process. Electronically Signed   By: Rise Mu M.D.   On: 04/20/2018 23:45    Review of Systems  Constitutional: Negative for chills and fever.  HENT: Negative for sore throat and tinnitus.   Eyes: Negative for blurred vision and redness.  Respiratory: Negative for cough and shortness of breath.   Cardiovascular: Negative for chest pain, palpitations, orthopnea and PND.  Gastrointestinal: Positive for abdominal pain. Negative for diarrhea, nausea and vomiting.  Genitourinary: Negative for dysuria, frequency and urgency.  Musculoskeletal: Negative for joint pain and  myalgias.  Skin: Negative for rash.       No lesions  Neurological: Negative for speech change, focal weakness and weakness.  Endo/Heme/Allergies: Does not bruise/bleed easily.       No temperature intolerance  Psychiatric/Behavioral: Negative for depression and suicidal ideas.    Blood pressure (!) 142/66, pulse 98, temperature 99 F (37.2 C), temperature source Oral, resp. rate 20, height 5\' 2"  (1.575 m), weight 107 kg, SpO2 96 %. Physical Exam  Vitals reviewed. Constitutional: She is oriented to person, place, and time. She appears well-developed and well-nourished. No distress.  HENT:  Head: Normocephalic and atraumatic.  Mouth/Throat: Oropharynx is clear and moist.  Eyes: Pupils are equal, round, and reactive to light. Conjunctivae and EOM are normal. No scleral icterus.  Neck: Normal range of motion. Neck supple. No JVD present. No tracheal deviation present. No thyromegaly present.  Cardiovascular: Normal rate, regular rhythm and normal heart sounds. Exam reveals no gallop and no friction rub.  No murmur heard. Respiratory: Effort normal and breath sounds normal.  GI: Soft. Bowel sounds are normal. She exhibits no distension. There is no abdominal tenderness.  Genitourinary:    Genitourinary Comments: Deferred   Musculoskeletal: Normal range of motion.        General: No edema.  Lymphadenopathy:    She has no cervical adenopathy.  Neurological: She is alert and oriented to person, place, and time. No cranial nerve deficit. She exhibits normal muscle tone.  Skin: Skin is warm and dry. No rash noted. No erythema.  Psychiatric: She has a normal mood and affect. Her behavior is normal. Judgment and thought content normal.     Assessment/Plan This is a 68 year old female admitted for sepsis. 1.  Sepsis: Patient meets criteria via leukocytosis and lactic acidosis.  She is hemodynamically stable.  Source appears to be urine.  Continue Rocephin.  Transition to oral antibiotics  prior to discharge. 2.  UTI: Present on admission; follow urine culture for growth and sensitivities.  Antibiotics as above 3.  Diabetes mellitus type 2:.  Hold oral hypoglycemic agents.  Sliding scale insulin while hospitalized.  Gabapentin for neuropathy 4.  Atrial fibrillation: Rate controlled; continue metoprolol and Eliquis 5.  Hypertension: Controlled;  continue amlodipine and lisinopril 6.  Morbid obesity: BMI 43; encouraged healthy diet and exercise. 7.  DVT prophylaxis: Therapeutic anticoagulation as above 8.  GI prophylaxis: None The patient is a full code.  Time spent on admission orders and patient care approximately 45 minutes   Arnaldo Natal, MD 04/21/2018, 6:19 AM

## 2018-04-21 NOTE — Progress Notes (Signed)
Lovenox changed to 40 mg BID for CrCl >30 and BMI >40. 

## 2018-04-21 NOTE — ED Notes (Signed)
Gracie RN, aware of bed assigned  

## 2018-04-22 LAB — CBC
HCT: 36.3 % (ref 36.0–46.0)
Hemoglobin: 11.2 g/dL — ABNORMAL LOW (ref 12.0–15.0)
MCH: 30 pg (ref 26.0–34.0)
MCHC: 30.9 g/dL (ref 30.0–36.0)
MCV: 97.3 fL (ref 80.0–100.0)
Platelets: 234 10*3/uL (ref 150–400)
RBC: 3.73 MIL/uL — ABNORMAL LOW (ref 3.87–5.11)
RDW: 13.6 % (ref 11.5–15.5)
WBC: 8.3 10*3/uL (ref 4.0–10.5)
nRBC: 0 % (ref 0.0–0.2)

## 2018-04-22 LAB — BASIC METABOLIC PANEL
Anion gap: 7 (ref 5–15)
BUN: 24 mg/dL — ABNORMAL HIGH (ref 8–23)
CO2: 23 mmol/L (ref 22–32)
CREATININE: 1.03 mg/dL — AB (ref 0.44–1.00)
Calcium: 8.3 mg/dL — ABNORMAL LOW (ref 8.9–10.3)
Chloride: 109 mmol/L (ref 98–111)
GFR calc Af Amer: >60 mL/min (ref >60)
GFR calc non Af Amer: 57 mL/min — ABNORMAL LOW (ref >60)
Glucose, Bld: 225 mg/dL — ABNORMAL HIGH (ref 70–99)
Potassium: 4.2 mmol/L (ref 3.5–5.1)
Sodium: 139 mmol/L (ref 135–145)

## 2018-04-22 LAB — GLUCOSE, CAPILLARY
Glucose-Capillary: 180 mg/dL — ABNORMAL HIGH (ref 70–99)
Glucose-Capillary: 224 mg/dL — ABNORMAL HIGH (ref 70–99)
Glucose-Capillary: 152 mg/dL — ABNORMAL HIGH (ref 70–99)
Glucose-Capillary: 162 mg/dL — ABNORMAL HIGH (ref 70–99)

## 2018-04-22 MED ORDER — GLIPIZIDE 10 MG PO TABS
10.0000 mg | ORAL_TABLET | Freq: Every day | ORAL | Status: DC
  Administered 2018-04-23: 10 mg via ORAL
  Filled 2018-04-22: qty 1

## 2018-04-22 MED ORDER — METFORMIN HCL ER 500 MG PO TB24
2000.0000 mg | ORAL_TABLET | Freq: Every day | ORAL | Status: DC
  Administered 2018-04-23: 2000 mg via ORAL
  Filled 2018-04-22: qty 4

## 2018-04-22 NOTE — Progress Notes (Signed)
[employer] [facility] [place] [facility]                                                                                                                                                                                  Patient Demographics   Wendy Summers, is a 68 y.o. female, DOB - July 30, 1950, PYY:[id number]  Admit date - 04/20/2018   Admitting Physician Arnaldo Natal, MD  Outpatient Primary MD for the patient is [facility]   LOS - 1  Subjective: She is feeling better today    Review of Systems:   CONSTITUTIONAL: No documented fever. No fatigue, weakness. No weight gain, no weight loss.  EYES: No blurry or double vision.  ENT: No tinnitus. No postnasal drip. No redness of the oropharynx.  RESPIRATORY: No cough, no wheeze, no hemoptysis. No dyspnea.  CARDIOVASCULAR: No chest pain. No orthopnea. No palpitations. No syncope.  GASTROINTESTINAL: No nausea, no vomiting or diarrhea. No abdominal pain. No melena or hematochezia.  GENITOURINARY: No dysuria or hematuria.  ENDOCRINE: No polyuria or nocturia. No heat or cold intolerance.  HEMATOLOGY: No anemia. No bruising. No bleeding.  INTEGUMENTARY: No rashes. No lesions.  MUSCULOSKELETAL: No arthritis. No swelling. No gout.  NEUROLOGIC: No numbness, tingling, or ataxia. No seizure-type activity.  PSYCHIATRIC: No anxiety. No insomnia. No ADD.    Vitals:   Vitals:   04/21/18 2049 04/22/18 0522 04/22/18 0527 04/22/18 1145  BP: (!) 150/74 127/66  131/67  Pulse: 99 79  71  Resp: 17 19  20   Temp: 98.5 F (36.9 C)  (!) 97.4 F (36.3 C) 98.1 F (36.7 C)  TempSrc: Oral  Oral Oral  SpO2: 96% 97%  96%  Weight:      Height:        Wt Readings from Last 3 Encounters:  04/21/18 107 kg  03/08/18 127 kg  02/25/18 127 kg     Intake/Output Summary (Last 24 hours) at 04/22/2018 1255 Last data filed at 04/22/2018 0900 Gross per 24 hour  Intake 2287.66 ml  Output 1900 ml  Net 387.66 ml    Physical  Exam:   GENERAL: Pleasant-appearing in no apparent distress.  HEAD, EYES, EARS, NOSE AND THROAT: Atraumatic, normocephalic. Extraocular muscles are intact. Pupils equal and reactive to light. Sclerae anicteric. No conjunctival injection. No oro-pharyngeal erythema.  NECK: Supple. There is no jugular venous distention. No bruits, no lymphadenopathy, no thyromegaly.  HEART: Regular rate and rhythm,. No murmurs, no rubs, no clicks.  LUNGS: Clear to auscultation bilaterally. No rales or rhonchi. No wheezes.  ABDOMEN: Soft, flat, nontender, nondistended. Has good bowel sounds. No hepatosplenomegaly appreciated.  EXTREMITIES: No evidence of any  cyanosis, clubbing, or peripheral edema.  +2 pedal and radial pulses bilaterally.  NEUROLOGIC: The patient is alert, awake, and oriented x3 with no focal motor or sensory deficits appreciated bilaterally.  SKIN: Moist and warm with no rashes appreciated.  Psych: Not anxious, depressed LN: No inguinal LN enlargement    Antibiotics   Anti-infectives (From admission, onward)   Start     Dose/Rate Route Frequency Ordered Stop   04/22/18 0000  cefTRIAXone (ROCEPHIN) 1 g in sodium chloride 0.9 % 100 mL IVPB     1 g 200 mL/hr over 30 Minutes Intravenous Every 24 hours 04/21/18 0348     04/20/18 2330  cefTRIAXone (ROCEPHIN) 1 g in sodium chloride 0.9 % 100 mL IVPB     1 g 200 mL/hr over 30 Minutes Intravenous  Once 04/20/18 2324 04/21/18 0112      Medications   Scheduled Meds: . amLODipine  10 mg Oral Daily  . apixaban  5 mg Oral Daily  . atorvastatin  40 mg Oral Daily  . docusate sodium  100 mg Oral BID  . gabapentin  300 mg Oral BID  . insulin aspart  0-5 Units Subcutaneous QHS  . insulin aspart  0-9 Units Subcutaneous TID WC  . latanoprost  1 drop Both Eyes QHS  . lisinopril  20 mg Oral Daily  . metoprolol tartrate  100 mg Oral BID  . nystatin   Topical BID   Continuous Infusions: . cefTRIAXone (ROCEPHIN)  IV Stopped (04/21/18 2256)  . sodium  chloride     And  . sodium chloride     PRN Meds:.acetaminophen **OR** acetaminophen, ondansetron **OR** ondansetron (ZOFRAN) IV   Data Review:   Micro Results Recent Results (from the past 240 hour(s))  Urine culture     Status: Abnormal (Preliminary result)   Collection Time: 04/20/18  8:02 PM  Result Value Ref Range Status   Specimen Description   Final    URINE, RANDOM Performed at [facility], [address] [place], [place] [address]    Special Requests   Final    NONE Performed at [facility], [address], [place], [place] [address]    Culture >=100,000 COLONIES/mL GRAM NEGATIVE RODS (A)  Final   Report Status PENDING  Incomplete  Culture, blood (routine x 2)     Status: None (Preliminary result)   Collection Time: 04/21/18 12:11 AM  Result Value Ref Range Status   Specimen Description BLOOD RIGHT HAND  Final   Special Requests   Final    BOTTLES DRAWN AEROBIC AND ANAEROBIC Blood Culture adequate volume   Culture   Final    NO GROWTH 1 DAY Performed at [facility], [address], [place], [place] [address]    Report Status PENDING  Incomplete  Culture, blood (routine x 2)     Status: None (Preliminary result)   Collection Time: 04/21/18 12:11 AM  Result Value Ref Range Status   Specimen Description BLOOD LEFT HAND  Final   Special Requests   Final    BOTTLES DRAWN AEROBIC AND ANAEROBIC Blood Culture adequate volume   Culture   Final    NO GROWTH 1 DAY Performed at [facility], [address] [place], [place] [address]    Report Status PENDING  Incomplete    Radiology Reports Ct Renal Stone Study  Result Date: 04/20/2018 CLINICAL DATA:  Initial evaluation for acute right-sided flank pain. EXAM: CT ABDOMEN AND PELVIS WITHOUT CONTRAST TECHNIQUE: Multidetector CT  imaging of the abdomen and pelvis was performed following the standard protocol without IV contrast. COMPARISON:  Prior CT from 02/25/2018 FINDINGS:  Lower chest: Visualized lung bases are clear. Hepatobiliary: Punctate calcification noted within left hepatic lobe. Limited noncontrast evaluation liver otherwise unremarkable. Gallbladder surgically absent. No biliary dilatation. Pancreas: Pancreas within normal limits. Spleen: Spleen within normal limits. Adrenals/Urinary Tract: Adrenal glands are normal. Left kidney unremarkable without nephrolithiasis or hydronephrosis. No radiopaque stones seen along the course of the left renal collecting system. No left-sided hydroureter. On the right, there is mild right hydroureteronephrosis. Associated right perinephric and periureteral fat stranding. No definite radiopaque stones seen along the course of the right renal collecting system. Punctate calcific densities seen adjacent to the distal right ureter on axial images 70 and 71 felt to be vascular in nature. Finding raises the possibility for a recently passed stone. No other calculi seen within the right kidney. Bladder largely decompressed. No layering stones within the bladder lumen. Stomach/Bowel: Stomach within normal limits. No evidence for bowel obstruction. Normal appendix. No acute inflammatory changes seen about the bowels. Vascular/Lymphatic: Mild aortic atherosclerosis. No aneurysm. No adenopathy. Reproductive: Uterus and ovaries within normal limits for age. Other: No free air or fluid. Musculoskeletal: No acute osseous abnormality. No discrete lytic or blastic osseous lesions. IMPRESSION: 1. Mild right hydroureteronephrosis with associated perinephric and periureteral fat stranding. No obstructive radiopaque stone identified. Finding raises the possibility for a recently passed stone. Possible infection would be the primary differential consideration. Correlation with urinalysis recommended. 2. No other acute intra-abdominal or pelvic process. Electronically Signed   By: Rise Mu M.D.   On: 04/20/2018 23:45     CBC Recent Labs  Lab  04/20/18 2002 04/22/18 0341  WBC 13.0* 8.3  HGB 13.8 11.2*  HCT 43.6 36.3  PLT 265 234  MCV 96.5 97.3  MCH 30.5 30.0  MCHC 31.7 30.9  RDW 13.6 13.6    Chemistries  Recent Labs  Lab 04/20/18 2058 04/22/18 0341  NA 139 139  K 4.3 4.2  CL 107 109  CO2 22 23  GLUCOSE 176* 225*  BUN 27* 24*  CREATININE 0.97 1.03*  CALCIUM 9.4 8.3*   ------------------------------------------------------------------------------------------------------------------ estimated creatinine clearance is 61.8 mL/min (A) (by C-G formula based on SCr of 1.03 mg/dL (H)). ------------------------------------------------------------------------------------------------------------------ No results for input(s): HGBA1C in the last 72 hours. ------------------------------------------------------------------------------------------------------------------ No results for input(s): CHOL, HDL, LDLCALC, TRIG, CHOLHDL, LDLDIRECT in the last 72 hours. ------------------------------------------------------------------------------------------------------------------ Recent Labs    04/20/18 2058  TSH 2.859   ------------------------------------------------------------------------------------------------------------------ No results for input(s): VITAMINB12, FOLATE, FERRITIN, TIBC, IRON, RETICCTPCT in the last 72 hours.  Coagulation profile No results for input(s): INR, PROTIME in the last 168 hours.  No results for input(s): DDIMER in the last 72 hours.  Cardiac Enzymes No results for input(s): CKMB, TROPONINI, MYOGLOBIN in the last 168 hours.  Invalid input(s): CK ------------------------------------------------------------------------------------------------------------------ Invalid input(s): POCBNP    Assessment & Plan   1.  Sepsis: Due to UTI, await urine cultures 2.  UTI: Present on admission; follow urine culture for growth and sensitivities.  Antibiotics as above 3.  Diabetes mellitus type 2:.     Resume home medications Sliding scale insulin while hospitalized.  Gabapentin for neuropathy 4.  Atrial fibrillation: Rate controlled; continue metoprolol and Eliquis 5.  Hypertension: Controlled; continue amlodipine and lisinopril 6.  Morbid obesity: BMI 43; encouraged healthy diet and exercise. 7.  DVT prophylaxis: Therapeutic anticoagulation as      Code Status Orders  (From admission,  onward)         Start     Ordered   04/21/18 0349  Full code  Continuous     04/21/18 0348        Code Status History    Date Active Date Inactive Code Status Order ID Comments User Context   02/25/2018 2225 03/02/2018 1922 Full Code [id number]  Auburn Bilberry, MD Inpatient   07/13/2016 0651 07/18/2016 1555 Full Code [id number]  Ihor Austin, MD Inpatient           Consults none  DVT Prophylaxis Eliquis  Lab Results  Component Value Date   PLT 234 04/22/2018     Time Spent in minutes 35 minutes greater than 50% of time spent in care coordination and counseling patient regarding the condition and plan of care.   Auburn Bilberry M.D on 04/22/2018 at 12:55 PM  Between 7am to 6pm - Pager - [phone]  After 6pm go to [email or web address] - Social research officer, government  [employer]   Office  [phone]

## 2018-04-23 LAB — GLUCOSE, CAPILLARY
Glucose-Capillary: 172 mg/dL — ABNORMAL HIGH (ref 70–99)
Glucose-Capillary: 115 mg/dL — ABNORMAL HIGH (ref 70–99)

## 2018-04-23 LAB — URINE CULTURE

## 2018-04-23 MED ORDER — NYSTATIN 100000 UNIT/GM EX POWD: Freq: Two times a day (BID) | CUTANEOUS | 0 refills | Status: DC

## 2018-04-23 MED ORDER — CIPROFLOXACIN HCL 500 MG PO TABS: 500.0000 mg | ORAL_TABLET | Freq: Two times a day (BID) | ORAL | 0 refills | Status: AC

## 2018-04-23 NOTE — Discharge Summary (Signed)
[employer] - [place] at [facility]  Husna J Mussa, 68 y.o., DOB [birth year], MRN [id number]. Admission date: 04/20/2018 Discharge Date 04/23/2018 [facility], [facility] Admitting Physician Arnaldo NatalMichael S Diamond, MD  Admission Diagnosis  Lower urinary tract infectious disease [N39.0] Flank pain [R10.9] Sepsis, due to unspecified organism, unspecified whether acute organ dysfunction present [facility](HCC) [A41.9]  Discharge Diagnosis   Active Problems: Sepsis due to UTI UTI Diabetes type 2 Paroxysmal atrial fibrillation Hypertension Morbid obesity  Hospital Course The patient with past medical history of diabetes as well as atrial fibrillation presents to the emergency department complaining of abdominal pain.  The patient reports that the pain is in her central lower abdomen and radiates to her back.    Patient was evaluated in the ED and was noted to have urinary tract infection.  She was started on IV antibiotics.  With improvement in her symptoms.  She is doing much better.  Urine culture show E. coli.  She will be discharged on oral antibiotics.            Consults  None  Significant Tests:  See full reports for all details     Ct Renal Stone Study  Result Date: 04/20/2018 CLINICAL DATA:  Initial evaluation for acute right-sided flank pain. EXAM: CT ABDOMEN AND PELVIS WITHOUT CONTRAST TECHNIQUE: Multidetector CT imaging of the abdomen and pelvis was performed following the standard protocol without IV contrast. COMPARISON:  Prior CT from 02/25/2018 FINDINGS: Lower chest: Visualized lung bases are clear. Hepatobiliary: Punctate calcification noted within left hepatic lobe. Limited noncontrast evaluation liver otherwise unremarkable. Gallbladder surgically absent. No biliary dilatation. Pancreas: Pancreas within normal limits. Spleen: Spleen within normal limits. Adrenals/Urinary Tract: Adrenal glands are normal. Left kidney unremarkable without  nephrolithiasis or hydronephrosis. No radiopaque stones seen along the course of the left renal collecting system. No left-sided hydroureter. On the right, there is mild right hydroureteronephrosis. Associated right perinephric and periureteral fat stranding. No definite radiopaque stones seen along the course of the right renal collecting system. Punctate calcific densities seen adjacent to the distal right ureter on axial images 70 and 71 felt to be vascular in nature. Finding raises the possibility for a recently passed stone. No other calculi seen within the right kidney. Bladder largely decompressed. No layering stones within the bladder lumen. Stomach/Bowel: Stomach within normal limits. No evidence for bowel obstruction. Normal appendix. No acute inflammatory changes seen about the bowels. Vascular/Lymphatic: Mild aortic atherosclerosis. No aneurysm. No adenopathy. Reproductive: Uterus and ovaries within normal limits for age. Other: No free air or fluid. Musculoskeletal: No acute osseous abnormality. No discrete lytic or blastic osseous lesions. IMPRESSION: 1. Mild right hydroureteronephrosis with associated perinephric and periureteral fat stranding. No obstructive radiopaque stone identified. Finding raises the possibility for a recently passed stone. Possible infection would be the primary differential consideration. Correlation with urinalysis recommended. 2. No other acute intra-abdominal or pelvic process. Electronically Signed   By: Rise MuBenjamin  McClintock M.D.   On: 04/20/2018 23:45       Today   Subjective:   Everlean PattersonNancy General patient doing better   Objective:   Blood pressure 129/63, pulse 73, temperature 97.7 F (36.5 C), temperature source Oral, resp. rate 16, height 5\' 2"  (1.575 m), weight 107.3 kg, SpO2 96 %.  .  Intake/Output Summary (Last 24 hours) at 04/23/2018 1345 Last data filed at 04/23/2018 1300 Gross per 24 hour  Intake 815 ml  Output 2300 ml  Net -1485 ml    Exam  VITAL  SIGNS: Blood pressure 129/63, pulse 73, temperature 97.7 F (36.5 C), temperature source Oral, resp. rate 16, height  (1.575 m), weight 107.3 kg, SpO2 96 %.  GENERAL:  68 y.o.-year-old patient lying in the bed with no acute distress.  EYES: Pupils equal, round, reactive to light and accommodation. No scleral icterus. Extraocular muscles intact.  HEENT: Head atraumatic, normocephalic. Oropharynx and nasopharynx clear.  NECK:  Supple, no jugular venous distention. No thyroid enlargement, no tenderness.  LUNGS: Normal breath sounds bilaterally, no wheezing, rales,rhonchi or crepitation. No use of accessory muscles of respiration.  CARDIOVASCULAR: S1, S2 normal. No murmurs, rubs, or gallops.  ABDOMEN: Soft, nontender, nondistended. Bowel sounds present. No organomegaly or mass.  EXTREMITIES: No pedal edema, cyanosis, or clubbing.  NEUROLOGIC: Cranial nerves II through XII are intact. Muscle strength 5/5 in all extremities. Sensation intact. Gait not checked.  PSYCHIATRIC: The patient is alert and oriented x 3.  SKIN: No obvious rash, lesion, or ulcer.   Data Review     CBC w Diff:  Lab Results  Component Value Date   WBC 8.3 04/22/2018   HGB 11.2 (L) 04/22/2018   HGB 13.1 10/01/2013   HCT 36.3 04/22/2018   HCT 39.8 10/01/2013   PLT 234 04/22/2018   PLT 268 10/01/2013   LYMPHOPCT 23 02/25/2018   LYMPHOPCT 25.4 10/01/2013   MONOPCT 10 02/25/2018   MONOPCT 7.2 10/01/2013   EOSPCT 2 02/25/2018   EOSPCT 5.0 10/01/2013   BASOPCT 1 02/25/2018   BASOPCT 0.8 10/01/2013   CMP:  Lab Results  Component Value Date   NA 139 04/22/2018   NA 138 10/01/2013   K 4.2 04/22/2018   K 4.3 03/06/2014   CL 109 04/22/2018   CL 106 10/01/2013   CO2 23 04/22/2018   CO2 22 10/01/2013   BUN 24 (H) 04/22/2018   BUN 12 10/01/2013   CREATININE 1.03 (H) 04/22/2018   CREATININE 1.08 10/01/2013   PROT 7.5 02/25/2018   PROT 7.7 01/31/2012   ALBUMIN 3.8 02/25/2018   ALBUMIN 3.4 01/31/2012    BILITOT 0.9 02/25/2018   BILITOT 0.4 01/31/2012   ALKPHOS 105 02/25/2018   ALKPHOS 147 (H) 01/31/2012   AST 27 02/25/2018   AST 119 (H) 01/31/2012   ALT 18 02/25/2018   ALT 117 (H) 01/31/2012  .  Micro Results Recent Results (from the past 240 hour(s))  Urine culture     Status: Abnormal   Collection Time: 04/20/18  8:02 PM  Result Value Ref Range Status   Specimen Description   Final    URINE, RANDOM Performed at [facility], [address] [place], [place] [address]    Special Requests   Final    NONE Performed at [facility], [address] [place], [place] [address]    Culture >=100,000 COLONIES/mL ESCHERICHIA COLI (A)  Final   Report Status 04/23/2018 FINAL  Final   Organism ID, Bacteria ESCHERICHIA COLI (A)  Final      Susceptibility   Escherichia coli - MIC*    AMPICILLIN >=32 RESISTANT Resistant     CEFAZOLIN <=4 SENSITIVE Sensitive     CEFTRIAXONE <=1 SENSITIVE Sensitive     CIPROFLOXACIN 0.5 SENSITIVE Sensitive     GENTAMICIN <=1 SENSITIVE Sensitive     IMIPENEM <=0.25 SENSITIVE Sensitive     NITROFURANTOIN <=16 SENSITIVE Sensitive     TRIMETH/SULFA >=320 RESISTANT Resistant     AMPICILLIN/SULBACTAM 16 INTERMEDIATE Intermediate     PIP/TAZO <=4 SENSITIVE  Sensitive     Extended ESBL NEGATIVE Sensitive     * >=100,000 COLONIES/mL ESCHERICHIA COLI  Culture, blood (routine x 2)     Status: None (Preliminary result)   Collection Time: 04/21/18 12:11 AM  Result Value Ref Range Status   Specimen Description BLOOD RIGHT HAND  Final   Special Requests   Final    BOTTLES DRAWN AEROBIC AND ANAEROBIC Blood Culture adequate volume   Culture   Final    NO GROWTH 2 DAYS Performed at [facility], [address] [place], [place] [address]    Report Status PENDING  Incomplete  Culture, blood (routine x 2)     Status: None (Preliminary result)   Collection Time: 04/21/18 12:11 AM  Result Value Ref Range Status   Specimen Description  BLOOD LEFT HAND  Final   Special Requests   Final    BOTTLES DRAWN AEROBIC AND ANAEROBIC Blood Culture adequate volume   Culture   Final    NO GROWTH 2 DAYS Performed at [facility], [address] [place], [place] [address]    Report Status PENDING  Incomplete        Code Status Orders  (From admission, onward)         Start     Ordered   04/21/18 0349  Full code  Continuous     04/21/18 0348        Code Status History    Date Active Date Inactive Code Status Order ID Comments User Context   02/25/2018 2225 03/02/2018 1922 Full Code [id number]  Auburn Bilberry, MD Inpatient   07/13/2016 0651 07/18/2016 1555 Full Code [id number]  Ihor Austin, MD Inpatient          Follow-up Information    Inc, [facility]. Go on 04/30/2018.   Why:  Dr. Freida Busman, Monday, 3/2 at 11 a.m.  [phone] Contact information: [address] [place] [address] [phone]           Discharge Medications   Allergies as of 04/23/2018   No Known Allergies     Medication List    TAKE these medications   amLODipine 10 MG tablet Commonly known as:  NORVASC Take 10 mg by mouth daily. for high blood pressure   apixaban 5 MG Tabs tablet Commonly known as:  ELIQUIS Take 5 mg by mouth daily.   atorvastatin 40 MG tablet Commonly known as:  LIPITOR Take 40 mg by mouth daily.   ciprofloxacin 500 MG tablet Commonly known as:  CIPRO Take 1 tablet (500 mg total) by mouth 2 (two) times daily for 7 days.   gabapentin 300 MG capsule Commonly known as:  NEURONTIN Take 1 capsule (300 mg total) by mouth 2 (two) times daily.   glipiZIDE 10 MG tablet Commonly known as:  GLUCOTROL Take 10 mg by mouth daily before breakfast.   guaiFENesin-dextromethorphan 100-10 MG/5ML syrup Commonly known as:  ROBITUSSIN DM Take 5 mLs by mouth every 4 (four) hours as needed for cough.   latanoprost 0.005 % ophthalmic solution Commonly known as:  XALATAN Place 1 drop into both  eyes at bedtime.   lisinopril 20 MG tablet Commonly known as:  PRINIVIL,ZESTRIL Take 20 mg by mouth daily. for high blood pressure   metFORMIN 500 MG 24 hr tablet Commonly known as:  GLUCOPHAGE-XR Take 2,000 mg by mouth daily.   metoprolol tartrate 100 MG tablet Commonly known as:  LOPRESSOR Take 100 mg by mouth 2 (two) times  daily.   nystatin powder Commonly known as:  MYCOSTATIN/NYSTOP Apply topically 2 (two) times daily. To groin area   SIMBRINZA 1-0.2 % Susp Generic drug:  Brinzolamide-Brimonidine Apply 1 drop to eye 2 (two) times daily.          Total Time in preparing paper work, data evaluation and todays exam - 35 minutes  Auburn Bilberry M.D on 04/23/2018 at 1:45 PM [employer]   Office  [phone]

## 2018-04-25 ENCOUNTER — Encounter
Admission: RE | Admit: 2018-04-25 | Discharge: 2018-04-25 | Disposition: A | Discharge status: Home / Self Care | Payer: Medicaid Other | Source: Ambulatory Visit | Attending: Internal Medicine | Admitting: Internal Medicine

## 2018-04-25 DIAGNOSIS — R0602 Shortness of breath: Secondary | ICD-10-CM | POA: Insufficient documentation

## 2018-04-25 DIAGNOSIS — I480 Paroxysmal atrial fibrillation: Secondary | ICD-10-CM | POA: Insufficient documentation

## 2018-04-25 LAB — NM MYOCAR MULTI W/SPECT W/WALL MOTION / EF
CHL CUP NUCLEAR SDS: 0
CHL CUP NUCLEAR SRS: 3
Estimated workload: 1 METS
Exercise duration (min): 1 min
Exercise duration (sec): 0 s
LV dias vol: 56 mL (ref 46–106)
LV sys vol: 16 mL
MPHR: 154 {beats}/min
Peak HR: 103 {beats}/min
Percent HR: 66 %
Rest HR: 90 {beats}/min
SSS: 3
TID: 0.88

## 2018-04-25 MED ORDER — REGADENOSON 0.4 MG/5ML IV SOLN
0.4000 mg | Freq: Once | INTRAVENOUS | Status: AC
  Administered 2018-04-25: 0.4 mg via INTRAVENOUS

## 2018-04-25 MED ORDER — TECHNETIUM TC 99M TETROFOSMIN IV KIT
11.0900 | PACK | Freq: Once | INTRAVENOUS | Status: AC | PRN
  Administered 2018-04-25: 11.09 via INTRAVENOUS

## 2018-04-25 MED ORDER — TECHNETIUM TC 99M TETROFOSMIN IV KIT
30.0000 | PACK | Freq: Once | INTRAVENOUS | Status: AC | PRN
  Administered 2018-04-25: 30.076 via INTRAVENOUS

## 2018-04-26 LAB — CULTURE, BLOOD (ROUTINE X 2)
Culture: NO GROWTH
Culture: NO GROWTH
Special Requests: ADEQUATE
Special Requests: ADEQUATE

## 2018-04-27 ENCOUNTER — Ambulatory Visit: Payer: Medicaid Other

## 2018-05-16 ENCOUNTER — Ambulatory Visit: Payer: Medicaid Other | Attending: Neurology

## 2018-05-16 DIAGNOSIS — R400 Somnolence: Secondary | ICD-10-CM | POA: Insufficient documentation

## 2018-06-02 ENCOUNTER — Other Ambulatory Visit: Payer: Self-pay | Admitting: Obstetrics and Gynecology

## 2018-06-10 ENCOUNTER — Other Ambulatory Visit: Payer: Self-pay | Admitting: Obstetrics and Gynecology

## 2018-06-15 ENCOUNTER — Other Ambulatory Visit: Payer: Self-pay

## 2018-06-15 ENCOUNTER — Inpatient Hospital Stay
Admission: EM | Admit: 2018-06-15 | Discharge: 2018-06-16 | DRG: 177 | Disposition: A | Discharge status: Other Acute Inpatient Hospital | Payer: Medicaid Other | Attending: Internal Medicine | Admitting: Internal Medicine

## 2018-06-15 ENCOUNTER — Emergency Department: Payer: Medicaid Other

## 2018-06-15 ENCOUNTER — Encounter: Payer: Self-pay | Admitting: *Deleted

## 2018-06-15 DIAGNOSIS — Z7901 Long term (current) use of anticoagulants: Secondary | ICD-10-CM

## 2018-06-15 DIAGNOSIS — Z20822 Contact with and (suspected) exposure to covid-19: Secondary | ICD-10-CM | POA: Diagnosis present

## 2018-06-15 DIAGNOSIS — Z79899 Other long term (current) drug therapy: Secondary | ICD-10-CM

## 2018-06-15 DIAGNOSIS — E119 Type 2 diabetes mellitus without complications: Secondary | ICD-10-CM

## 2018-06-15 DIAGNOSIS — R6889 Other general symptoms and signs: Secondary | ICD-10-CM

## 2018-06-15 DIAGNOSIS — J45909 Unspecified asthma, uncomplicated: Secondary | ICD-10-CM | POA: Diagnosis present

## 2018-06-15 DIAGNOSIS — E785 Hyperlipidemia, unspecified: Secondary | ICD-10-CM | POA: Diagnosis present

## 2018-06-15 DIAGNOSIS — G43909 Migraine, unspecified, not intractable, without status migrainosus: Secondary | ICD-10-CM | POA: Diagnosis present

## 2018-06-15 DIAGNOSIS — I5033 Acute on chronic diastolic (congestive) heart failure: Secondary | ICD-10-CM | POA: Diagnosis present

## 2018-06-15 DIAGNOSIS — I4891 Unspecified atrial fibrillation: Secondary | ICD-10-CM | POA: Diagnosis present

## 2018-06-15 DIAGNOSIS — N179 Acute kidney failure, unspecified: Secondary | ICD-10-CM | POA: Diagnosis present

## 2018-06-15 DIAGNOSIS — Z7984 Long term (current) use of oral hypoglycemic drugs: Secondary | ICD-10-CM

## 2018-06-15 DIAGNOSIS — R079 Chest pain, unspecified: Secondary | ICD-10-CM | POA: Diagnosis present

## 2018-06-15 DIAGNOSIS — I4892 Unspecified atrial flutter: Secondary | ICD-10-CM | POA: Diagnosis present

## 2018-06-15 DIAGNOSIS — Z833 Family history of diabetes mellitus: Secondary | ICD-10-CM

## 2018-06-15 DIAGNOSIS — I480 Paroxysmal atrial fibrillation: Secondary | ICD-10-CM | POA: Diagnosis present

## 2018-06-15 LAB — BASIC METABOLIC PANEL
Anion gap: 14 (ref 5–15)
BUN: 35 mg/dL — ABNORMAL HIGH (ref 8–23)
CO2: 21 mmol/L — ABNORMAL LOW (ref 22–32)
Calcium: 8.6 mg/dL — ABNORMAL LOW (ref 8.9–10.3)
Chloride: 99 mmol/L (ref 98–111)
Creatinine, Ser: 1.39 mg/dL — ABNORMAL HIGH (ref 0.44–1.00)
GFR calc Af Amer: 46 mL/min — ABNORMAL LOW (ref >60)
GFR calc non Af Amer: 39 mL/min — ABNORMAL LOW (ref >60)
Glucose, Bld: 266 mg/dL — ABNORMAL HIGH (ref 70–99)
Potassium: 4.8 mmol/L (ref 3.5–5.1)
Sodium: 134 mmol/L — ABNORMAL LOW (ref 135–145)

## 2018-06-15 LAB — CBC
HCT: 41.8 % (ref 36.0–46.0)
Hemoglobin: 13.5 g/dL (ref 12.0–15.0)
MCH: 30.1 pg (ref 26.0–34.0)
MCHC: 32.3 g/dL (ref 30.0–36.0)
MCV: 93.3 fL (ref 80.0–100.0)
Platelets: 313 10*3/uL (ref 150–400)
RBC: 4.48 MIL/uL (ref 3.87–5.11)
RDW: 13 % (ref 11.5–15.5)
WBC: 7.3 10*3/uL (ref 4.0–10.5)
nRBC: 0 % (ref 0.0–0.2)

## 2018-06-15 LAB — BRAIN NATRIURETIC PEPTIDE: B Natriuretic Peptide: 358 pg/mL — ABNORMAL HIGH (ref 0.0–100.0)

## 2018-06-15 LAB — MAGNESIUM: Magnesium: 1.9 mg/dL (ref 1.7–2.4)

## 2018-06-15 LAB — GLUCOSE, CAPILLARY: Glucose-Capillary: 221 mg/dL — ABNORMAL HIGH (ref 70–99)

## 2018-06-15 LAB — TROPONIN I: Troponin I: <0.03 ng/mL (ref <0.03)

## 2018-06-15 LAB — TSH: TSH: 1.181 u[IU]/mL (ref 0.350–4.500)

## 2018-06-15 MED ORDER — ATORVASTATIN CALCIUM 20 MG PO TABS: 40.0000 mg | ORAL_TABLET | Freq: Every evening | ORAL | Status: DC

## 2018-06-15 MED ORDER — LATANOPROST 0.005 % OP SOLN: 1.0000 [drp] | Freq: Every day | OPHTHALMIC | Status: DC

## 2018-06-15 MED ORDER — ONDANSETRON HCL 4 MG PO TABS: 4.0000 mg | ORAL_TABLET | Freq: Four times a day (QID) | ORAL | Status: DC | PRN

## 2018-06-15 MED ORDER — ACETAMINOPHEN 650 MG RE SUPP: 650.0000 mg | Freq: Four times a day (QID) | RECTAL | Status: DC | PRN

## 2018-06-15 MED ORDER — DILTIAZEM LOAD VIA INFUSION
15.0000 mg | Freq: Once | INTRAVENOUS | Status: AC
  Administered 2018-06-15: 15 mg via INTRAVENOUS
  Filled 2018-06-15: qty 15

## 2018-06-15 MED ORDER — INSULIN ASPART 100 UNIT/ML ~~LOC~~ SOLN
0.0000 [IU] | Freq: Three times a day (TID) | SUBCUTANEOUS | Status: DC
  Administered 2018-06-16: 08:00:00 3 [IU] via SUBCUTANEOUS
  Administered 2018-06-16 (×2): 5 [IU] via SUBCUTANEOUS
  Filled 2018-06-15 (×3): qty 1

## 2018-06-15 MED ORDER — APIXABAN 5 MG PO TABS
5.0000 mg | ORAL_TABLET | Freq: Two times a day (BID) | ORAL | Status: DC
  Administered 2018-06-16: 5 mg via ORAL
  Filled 2018-06-15: qty 1

## 2018-06-15 MED ORDER — ACETAMINOPHEN 325 MG PO TABS: 650.0000 mg | ORAL_TABLET | Freq: Once | ORAL | Status: DC

## 2018-06-15 MED ORDER — GABAPENTIN 300 MG PO CAPS: 300.0000 mg | ORAL_CAPSULE | Freq: Two times a day (BID) | ORAL | Status: DC

## 2018-06-15 MED ORDER — DILTIAZEM HCL 100 MG IV SOLR
5.0000 mg/h | INTRAVENOUS | Status: DC
  Administered 2018-06-15: 21:00:00 5 mg/h via INTRAVENOUS
  Administered 2018-06-16 (×2): 15 mg/h via INTRAVENOUS
  Administered 2018-06-16: 12.5 mg/h via INTRAVENOUS
  Administered 2018-06-16: 15 mg/h via INTRAVENOUS
  Filled 2018-06-15 (×4): qty 100

## 2018-06-15 MED ORDER — INSULIN ASPART 100 UNIT/ML ~~LOC~~ SOLN: 0.0000 [IU] | Freq: Every day | SUBCUTANEOUS | Status: DC

## 2018-06-15 MED ORDER — SODIUM CHLORIDE 0.9% FLUSH: 3.0000 mL | Freq: Once | INTRAVENOUS | Status: DC

## 2018-06-15 MED ORDER — ONDANSETRON HCL 4 MG/2ML IJ SOLN
4.0000 mg | Freq: Four times a day (QID) | INTRAMUSCULAR | Status: DC | PRN
  Administered 2018-06-16: 4 mg via INTRAVENOUS
  Filled 2018-06-15: qty 2

## 2018-06-15 MED ORDER — ACETAMINOPHEN 325 MG PO TABS: 650.0000 mg | ORAL_TABLET | Freq: Four times a day (QID) | ORAL | Status: DC | PRN

## 2018-06-15 NOTE — ED Notes (Signed)
ED TO INPATIENT HANDOFF REPORT  ED Nurse Name and Phone #: Gwynneth MunsonButch, RN [phone]  S Name/Age/Gender Wendy BossNancy J Summers 68 y.o. female Room/Bed: ED17A/ED17A  Code Status   Code Status: Prior  Home/SNF/Other Home Patient oriented to: self, place and situation Is this baseline? Yes   Triage Complete: Triage complete  Chief Complaint Nausea  Triage Note Pt presents w/ c/o nausea all day today, woke w/ nausea. Pt c/o chest pain and dyspnea. Pt states this is new onset. Pt is HoH. Pt tachypneic after walking from car to triage room.    Allergies No Known Allergies  Level of Care/Admitting Diagnosis ED Disposition    ED Disposition Condition Comment   Admit  Hospital Area[facility] [[id number]]  Level of Care: Telemetry [5]  Covid Evaluation: N/A  Diagnosis: Atrial fibrillation with [facility](HCC) [[id number]]) [[id number]]  Admitting Physician: Wendy ManisWILLIS, DAVID [[id number]]  Attending Physician: Wendy ManisWILLIS, DAVID [[id number]]  Bed request comments: 2a  PT Class (Do Not Modify): Observation [104]  PT Acc Code (Do Not Modify): Observation [10022]       B Medical/Surgery History Past Medical History:  Diagnosis Date  . Asthma   . Bronchitis   . Diabetes mellitus without complication (HCC)   . Migraine    Past Surgical History:  Procedure Laterality Date  . CHOLECYSTECTOMY       A IV Location/Drains/Wounds Patient Lines/Drains/Airways Status   Active Line/Drains/Airways    Name:   Placement date:   Placement time:   Site:   Days:   Peripheral IV 06/15/18 Right Wrist   06/15/18    2037    Wrist   less than 1   Peripheral IV 06/15/18 Left Hand   06/15/18    2103    Hand   less than 1          Intake/Output Last 24 hours No intake or output data in the 24 hours ending 06/15/18 2321  Labs/Imaging Results for orders placed or performed during the hospital encounter of 06/15/18 (from the past 48 hour(s))  Basic metabolic panel     Status: Abnormal   Collection Time: 06/15/18   8:36 PM  Result Value Ref Range   Sodium 134 (L) 135 - 145 mmol/L   Potassium 4.8 3.5 - 5.1 mmol/L   Chloride 99 98 - 111 mmol/L   CO2 21 (L) 22 - 32 mmol/L   Glucose, Bld 266 (H) 70 - 99 mg/dL   BUN 35 (H) 8 - 23 mg/dL   Creatinine, Ser 4.011.39 (H) 0.44 - 1.00 mg/dL   Calcium 8.6 (L) 8.9 - 10.3 mg/dL   GFR calc non Af Amer 39 (L) >60 mL/min   GFR calc Af Amer 46 (L) >60 mL/min   Anion gap 14 5 - 15    Comment: Performed at [facility], [address] [place], [place] [id number]  CBC     Status: None   Collection Time: 06/15/18  8:36 PM  Result Value Ref Range   WBC 7.3 4.0 - 10.5 K/uL   RBC 4.48 3.87 - 5.11 MIL/uL   Hemoglobin 13.5 12.0 - 15.0 g/dL   HCT 36.641.8 44.036.0 - 34.746.0 %   MCV 93.3 80.0 - 100.0 fL   MCH 30.1 26.0 - 34.0 pg   MCHC 32.3 30.0 - 36.0 g/dL   RDW 42.513.0 95.611.5 - 38.715.5 %   Platelets 313 150 - 400 K/uL   nRBC 0.0 0.0 - 0.2 %    Comment: Performed at [facility], [address] [place], [place] [address]  Troponin I - ONCE - STAT     Status: None   Collection Time: 06/15/18  8:36 PM  Result Value Ref Range   Troponin I <0.03 <0.03 ng/mL    Comment: Performed at [facility], [address] [place], [place] [address]  Magnesium     Status: None   Collection Time: 06/15/18  8:36 PM  Result Value Ref Range   Magnesium 1.9 1.7 - 2.4 mg/dL    Comment: Performed at [facility], [address] [place], [place] [address]  TSH     Status: None   Collection Time: 06/15/18  8:36 PM  Result Value Ref Range   TSH 1.181 0.350 - 4.500 uIU/mL    Comment: Performed by a 3rd Generation assay with a functional sensitivity of <=0.01 uIU/mL. Performed at [facility], [address] [place], [place] [address]   Brain natriuretic peptide     Status: Abnormal   Collection Time: 06/15/18  8:36 PM  Result Value Ref Range   B Natriuretic Peptide 358.0 (H) 0.0 - 100.0 pg/mL    Comment: Performed at [facility], [address] [place], [place] [address]   Dg Chest Port 1 View  Result Date: 06/15/2018 CLINICAL DATA:  Nausea. EXAM: PORTABLE CHEST 1 VIEW COMPARISON:  03/08/2018 FINDINGS: 2038 hours. Patient rotated to the right. The cardio pericardial silhouette is enlarged. There is pulmonary vascular congestion without overt pulmonary edema. Probable interstitial pulmonary edema. No substantial pleural effusion. The visualized bony structures of the thorax are intact. Telemetry leads overlie the chest. IMPRESSION: Cardiomegaly with vascular congestion and probable interstitial edema. Electronically Signed   By: Kennith Center M.D.   On: 06/15/2018 21:28    Pending Labs Unresulted Labs (From admission, onward)    Start     Ordered   06/15/18 2129  Urinalysis, Complete w Microscopic  ONCE - STAT,   STAT     06/15/18 2128   Signed and Held  HIV antibody (Routine Testing)  Once,   R     Signed and Held   Signed and Held  Basic metabolic panel  Tomorrow morning,   R     Signed and Held   Signed and Held  CBC  Tomorrow morning,   R     Signed and Held          Vitals/Pain Today's Vitals   06/15/18 2104 06/15/18 2143 06/15/18 2224 06/15/18 2256  BP:  115/79 127/86 113/71  Pulse:  (!) 111 (!) 103 (!) 115  Resp:  18 18 18   Temp:      TempSrc:      SpO2:  94% 94% 94%  Weight:      Height:      PainSc: 0-No pain   0-No pain    Isolation Precautions No active isolations  Medications Medications  sodium chloride flush (NS) 0.9 % injection 3 mL (has no administration in time range)  diltiazem (CARDIZEM) 100 mg in dextrose 5 % 100 mL (1 mg/mL) infusion (5 mg/hr Intravenous Transfusing/Transfer 06/15/18 2316)  diltiazem (CARDIZEM) 1 mg/mL load via infusion 15 mg (15 mg Intravenous Bolus from Bag 06/15/18 2124)    Mobility walks with person assist Moderate fall risk   Focused Assessments    R Recommendations: See Admitting Provider Note  Report given to:   Additional Notes:

## 2018-06-15 NOTE — H&P (Addendum)
[facility] [place] [facility]   PATIENT NAME: Wendy Summers    MR#:  [id number]  DATE OF BIRTH:  [birth year]  DATE OF ADMISSION:  06/15/2018  PRIMARY CARE PHYSICIAN: [facility]   REQUESTING/REFERRING PHYSICIAN: Alphonzo Lemmings, MD  CHIEF COMPLAINT:   Chief Complaint  Patient presents with  . Nausea  . Chest Pain    HISTORY OF PRESENT ILLNESS:  Wendy Summers  is a 68 y.o. female who presents with chief complaint as above.  Patient is a poor historian and is unable to relate very much information to her HPI.  She does state that she has been feeling short of breath for a few days.  She states that she has had a fast heart rate before, but cannot recall if she is ever been diagnosed with A. fib.  She is in A. fib with RVR here, with mild vascular congestion on chest x-ray.  She denies any fever or cough.  Hospitalist were called for admission.  Of note, subsequent to this patient's admission her husband was evaluated in the ED for cough and was found to have bilateral pneumonia on x-ray and was coronavirus positive.  PAST MEDICAL HISTORY:   Past Medical History:  Diagnosis Date  . Asthma   . Bronchitis   . Diabetes mellitus without complication (HCC)   . Migraine      PAST SURGICAL HISTORY:   Past Surgical History:  Procedure Laterality Date  . CHOLECYSTECTOMY       SOCIAL HISTORY:   Social History   Tobacco Use  . Smoking status: Never Smoker  . Smokeless tobacco: Never Used  Substance Use Topics  . Alcohol use: No     FAMILY HISTORY:   Family History  Problem Relation Age of Onset  . Diabetes Mellitus II Mother   . Pancreatic cancer Sister      DRUG ALLERGIES:  No Known Allergies  MEDICATIONS AT HOME:   Prior to Admission medications   Medication Sig Start Date End Date Taking? Authorizing Provider  amLODipine (NORVASC) 10 MG tablet Take 10 mg by mouth daily. for high blood pressure 03/12/18   [provider]  apixaban (ELIQUIS) 5 MG TABS tablet Take 5 mg by mouth daily.    [provider]  atorvastatin (LIPITOR) 40 MG tablet Take 40 mg by mouth daily.    [provider]  gabapentin (NEURONTIN) 300 MG capsule Take 1 capsule (300 mg total) by mouth 2 (two) times daily. 03/02/18   Gouru, Deanna Artis, MD  glipiZIDE (GLUCOTROL) 10 MG tablet Take 10 mg by mouth daily before breakfast.    [provider]  guaiFENesin-dextromethorphan (ROBITUSSIN DM) 100-10 MG/5ML syrup Take 5 mLs by mouth every 4 (four) hours as needed for cough. 03/02/18   Gouru, Deanna Artis, MD  latanoprost (XALATAN) 0.005 % ophthalmic solution Place 1 drop into both eyes at bedtime.    [provider]  lisinopril (PRINIVIL,ZESTRIL) 20 MG tablet Take 20 mg by mouth daily. for high blood pressure 03/12/18   [provider]  metFORMIN (GLUCOPHAGE-XR) 500 MG 24 hr tablet Take 2,000 mg by mouth daily. 01/18/18   [provider]  metoprolol tartrate (LOPRESSOR) 100 MG tablet Take 100 mg by mouth 2 (two) times daily. 04/08/18   [provider]  nystatin (MYCOSTATIN/NYSTOP) powder Apply topically 2 (two) times daily. To groin area 04/23/18   Auburn Bilberry, MD  [facility] 1-0.2 % SUSP Apply 1 drop to eye 2 (two) times daily.  04/08/18   [provider]    REVIEW OF SYSTEMS:  Review of Systems  Constitutional: Negative for chills, fever, malaise/fatigue and weight loss.  HENT: Negative for ear pain, hearing loss and tinnitus.   Eyes: Negative for blurred vision, double vision, pain and redness.  Respiratory: Positive for shortness of breath. Negative for cough and hemoptysis.   Cardiovascular: Negative for chest pain, palpitations, orthopnea and leg swelling.  Gastrointestinal: Negative for abdominal pain, constipation, diarrhea, nausea and vomiting.  Genitourinary: Negative for dysuria, frequency and hematuria.  Musculoskeletal: Negative for back pain, joint pain and neck pain.   Skin:       No acne, rash, or lesions  Neurological: Negative for dizziness, tremors, focal weakness and weakness.  Endo/Heme/Allergies: Negative for polydipsia. Does not bruise/bleed easily.  Psychiatric/Behavioral: Negative for depression. The patient is not nervous/anxious and does not have insomnia.      VITAL SIGNS:   Vitals:   06/15/18 2046 06/15/18 2104 06/15/18 2143 06/15/18 2224  BP: (!) 153/90 (!) 142/83 115/79 127/86  Pulse:  (!) 109 (!) 111 (!) 103  Resp: 11 20 18 18   Temp:      TempSrc:      SpO2:  95% 94% 94%  Weight:      Height:       Wt Readings from Last 3 Encounters:  06/15/18 108.9 kg  04/23/18 107.3 kg  03/08/18 127 kg    PHYSICAL EXAMINATION:  Physical Exam  Vitals reviewed. Constitutional: She is oriented to person, place, and time. She appears well-developed and well-nourished. No distress.  HENT:  Head: Normocephalic and atraumatic.  Mouth/Throat: Oropharynx is clear and moist.  Eyes: Pupils are equal, round, and reactive to light. Conjunctivae and EOM are normal. No scleral icterus.  Neck: Normal range of motion. Neck supple. No JVD present. No thyromegaly present.  Cardiovascular: Intact distal pulses. Exam reveals no gallop and no friction rub.  No murmur heard. Tachycardic, irregular rhythm  Respiratory: Effort normal. No respiratory distress. She has no wheezes. She has rales.  GI: Soft. Bowel sounds are normal. She exhibits no distension. There is no abdominal tenderness.  Musculoskeletal: Normal range of motion.        General: No edema.     Comments: No arthritis, no gout  Lymphadenopathy:    She has no cervical adenopathy.  Neurological: She is alert and oriented to person, place, and time. No cranial nerve deficit.  No dysarthria, no aphasia  Skin: Skin is warm and dry. No rash noted. No erythema.  Psychiatric: She has a normal mood and affect. Her behavior is normal. Judgment and thought content normal.    LABORATORY PANEL:    CBC Recent Labs  Lab 06/15/18 2036  WBC 7.3  HGB 13.5  HCT 41.8  PLT 313   ------------------------------------------------------------------------------------------------------------------  Chemistries  Recent Labs  Lab 06/15/18 2036  NA 134*  K 4.8  CL 99  CO2 21*  GLUCOSE 266*  BUN 35*  CREATININE 1.39*  CALCIUM 8.6*  MG 1.9   ------------------------------------------------------------------------------------------------------------------  Cardiac Enzymes Recent Labs  Lab 06/15/18 2036  TROPONINI <0.03   ------------------------------------------------------------------------------------------------------------------  RADIOLOGY:  Dg Chest Port 1 View  Result Date: 06/15/2018 CLINICAL DATA:  Nausea. EXAM: PORTABLE CHEST 1 VIEW COMPARISON:  03/08/2018 FINDINGS: 2038 hours. Patient rotated to the right. The cardio pericardial silhouette is enlarged. There is pulmonary vascular congestion without overt pulmonary edema. Probable interstitial pulmonary edema. No substantial pleural effusion. The visualized bony structures of the thorax are intact.  Telemetry leads overlie the chest. IMPRESSION: Cardiomegaly with vascular congestion and probable interstitial edema. Electronically Signed   By: Kennith CenterEric  Mansell M.D.   On: 06/15/2018 21:28    EKG:   Orders placed or performed during the hospital encounter of 06/15/18  . EKG 12-Lead  . EKG 12-Lead  . ED EKG  . ED EKG    IMPRESSION AND PLAN:  Principal Problem:   Atrial fibrillation with RVR (HCC) -patient was given nodal blocking agents in the ED, and then placed on a diltiazem drip.  We will leave her on a diltiazem drip for now, and admit to the cardiac floor.  Cardiology consult ordered Active Problems:   AKI (acute kidney injury) (HCC) -we will hold off on any diuresis at this time as her respiratory status is stable, despite vascular congestion on chest x-ray.  Avoid nephrotoxins.  Given her A. fib RVR and her  vascular congestion we will not administer IV fluids at this time either.   Suspected 2019 Novel Coronavirus Infection -her husband tested positive in our ED tonight.  We will put her on respiratory isolation and contact precautions and test her for normal coronavirus   Diabetes (HCC) -sliding scale insulin   Asthma -home dose inhalers  Chart review performed and case discussed with ED provider. Labs, imaging and/or ECG reviewed by provider and discussed with patient/family. Management plans discussed with the patient and/or family.  DVT PROPHYLAXIS: Systemic anticoagulation  GI PROPHYLAXIS:  None  ADMISSION STATUS: Observation    CODE STATUS: Full Code Status History    Date Active Date Inactive Code Status Order ID Comments User Context   04/21/2018 0348 04/23/2018 1942 Full Code [id number]  Arnaldo Nataliamond, Michael S, MD Inpatient   02/25/2018 2225 03/02/2018 1922 Full Code [id number]  Auburn BilberryPatel, Shreyang, MD Inpatient   07/13/2016 0651 07/18/2016 1555 Full Code [id number]  Ihor AustinPyreddy, Pavan, MD Inpatient      TOTAL TIME TAKING CARE OF THIS PATIENT: 40 minutes.   Barney DrainDavid F Elleigh Cassetta 06/15/2018, 10:43 PM  [facility]  Office  [phone]  CC: Primary care physician; [facility]  Note:  This document was prepared using Conservation officer, historic buildingsDragon voice recognition software and may include unintentional dictation errors.

## 2018-06-15 NOTE — ED Provider Notes (Addendum)
[facility] Emergency Department Provider Note  ____________________________________________   I have reviewed the triage vital signs and the nursing notes. Where available I have reviewed prior notes and, if possible and indicated, outside hospital notes.    HISTORY  Chief Complaint Nausea and Chest Pain    HPI Wendy Summers is a 68 y.o. female  With a history of atrial fibrillation with RVR states she just not feeling well today.  She denies any fever chills or cough.  She states he has been nauseated.  To me, she denies any chest pain, she states that she has been having a uncomfortable feeling is consistent with her atrial fibrillation.  She denies any diarrhea fever cough headache or pain of any variety no abdominal pain.  No leg swelling.  Slightly short of breath.  Patient is a very poor historian.    Past Medical History:  Diagnosis Date  . Asthma   . Bronchitis   . Diabetes mellitus without complication (HCC)   . Migraine     Patient Active Problem List   Diagnosis Date Noted  . Sepsis (HCC) 04/21/2018  . Acute asthma exacerbation 02/25/2018  . Asthmatic bronchitis 07/14/2016  . Near syncope 07/13/2016    Past Surgical History:  Procedure Laterality Date  . CHOLECYSTECTOMY      Prior to Admission medications   Medication Sig Start Date End Date Taking? Authorizing Provider  amLODipine (NORVASC) 10 MG tablet Take 10 mg by mouth daily. for high blood pressure 03/12/18   [provider]  apixaban (ELIQUIS) 5 MG TABS tablet Take 5 mg by mouth daily.    [provider]  atorvastatin (LIPITOR) 40 MG tablet Take 40 mg by mouth daily.    [provider]  gabapentin (NEURONTIN) 300 MG capsule Take 1 capsule (300 mg total) by mouth 2 (two) times daily. 03/02/18   Gouru, Deanna ArtisAruna, MD  glipiZIDE (GLUCOTROL) 10 MG tablet Take 10 mg by mouth daily before breakfast.    [provider]  guaiFENesin-dextromethorphan  (ROBITUSSIN DM) 100-10 MG/5ML syrup Take 5 mLs by mouth every 4 (four) hours as needed for cough. 03/02/18   Gouru, Deanna ArtisAruna, MD  latanoprost (XALATAN) 0.005 % ophthalmic solution Place 1 drop into both eyes at bedtime.    [provider]  lisinopril (PRINIVIL,ZESTRIL) 20 MG tablet Take 20 mg by mouth daily. for high blood pressure 03/12/18   [provider]  metFORMIN (GLUCOPHAGE-XR) 500 MG 24 hr tablet Take 2,000 mg by mouth daily. 01/18/18   [provider]  metoprolol tartrate (LOPRESSOR) 100 MG tablet Take 100 mg by mouth 2 (two) times daily. 04/08/18   [provider]  nystatin (MYCOSTATIN/NYSTOP) powder Apply topically 2 (two) times daily. To groin area 04/23/18   Auburn BilberryPatel, Shreyang, MD  [facility] % SUSP Apply 1 drop to eye 2 (two) times daily.  04/08/18   [provider]    Allergies Patient has no known allergies.  Family History  Problem Relation Age of Onset  . Diabetes Mellitus II Mother   . Pancreatic cancer Sister     Social History Social History   Tobacco Use  . Smoking status: Never Smoker  . Smokeless tobacco: Never Used  Substance Use Topics  . Alcohol use: No  . Drug use: No    Review of Systems Constitutional: No fever/chills Eyes: No visual changes. ENT: No sore throat. No stiff neck no neck pain Cardiovascular: Denies chest pain. Respiratory: Denies shortness of breath. Gastrointestinal:  no vomiting.  No diarrhea.  No constipation. Genitourinary: Negative for dysuria. Musculoskeletal: Negative lower extremity swelling Skin: Negative for rash. Neurological: Negative for severe headaches, focal weakness or numbness.   ____________________________________________   PHYSICAL EXAM:  VITAL SIGNS: ED Triage Vitals  Enc Vitals Group     BP 06/15/18 2021 (!) 145/64     Pulse Rate 06/15/18 2021 (!) 49     Resp 06/15/18 2021 (!) 28     Temp 06/15/18 2021 97.6 F (36.4 C)     Temp Source 06/15/18 2021 Oral      SpO2 06/15/18 2021 95 %     Weight 06/15/18 2022 240 lb (108.9 kg)     Height 06/15/18 2022 5\' 2"  (1.575 m)     Head Circumference --      Peak Flow --      Pain Score 06/15/18 2021 8     Pain Loc --      Pain Edu? --      Excl. in GC? --     Constitutional: Alert and oriented. Well appearing and in no acute distress. Eyes: Conjunctivae are normal Head: Atraumatic HEENT: No congestion/rhinnorhea. Mucous membranes are moist.  Oropharynx non-erythematous Neck:   Nontender with no meningismus, no masses, no stridor Cardiovascular: Irregular, tachycardia to 160 noted. Grossly normal heart sounds.  Good peripheral circulation. Respiratory: Normal respiratory effort.  No retractions.  Diminished with no rales or rhonchi Abdominal: Soft and nontender. No distention. No guarding no rebound Back:  There is no focal tenderness or step off.  there is no midline tenderness there are no lesions noted. there is no CVA tenderness Musculoskeletal: No lower extremity tenderness, no upper extremity tenderness. No joint effusions, no DVT signs strong distal pulses no edema Neurologic:  Normal speech and language. No gross focal neurologic deficits are appreciated.  Skin:  Skin is warm, dry and intact. No rash noted. Psychiatric: Mood and affect are normal. Speech and behavior are normal.  ____________________________________________   LABS (all labs ordered are listed, but only abnormal results are displayed)  Labs Reviewed  BASIC METABOLIC PANEL - Abnormal; Notable for the following components:      Result Value   Sodium 134 (*)    CO2 21 (*)    Glucose, Bld 266 (*)    BUN 35 (*)    Creatinine, Ser 1.39 (*)    Calcium 8.6 (*)    GFR calc non Af Amer 39 (*)    GFR calc Af Amer 46 (*)    All other components within normal limits  BRAIN NATRIURETIC PEPTIDE - Abnormal; Notable for the following components:   B Natriuretic Peptide 358.0 (*)    All other components within normal limits  CBC   TROPONIN I  MAGNESIUM  TSH  URINALYSIS, COMPLETE (UACMP) WITH MICROSCOPIC    Pertinent labs  results that were available during my care of the patient were reviewed by me and considered in my medical decision making (see chart for details). ____________________________________________  EKG  I personally interpreted any EKGs ordered by me or triage Atrial fibrillation with left ventricular response rate 163, PVC also noted.  No acute ST elevation or depression.  _______  RADIOLOGY  Pertinent labs & imaging results that were available during my care of the patient were reviewed by me and considered in my medical decision making (see chart for details). If possible, patient and/or family made aware of any abnormal findings.  Dg Chest Port 1 View  Result Date:  06/15/2018 CLINICAL DATA:  Nausea. EXAM: PORTABLE CHEST 1 VIEW COMPARISON:  03/08/2018 FINDINGS: 2038 hours. Patient rotated to the right. The cardio pericardial silhouette is enlarged. There is pulmonary vascular congestion without overt pulmonary edema. Probable interstitial pulmonary edema. No substantial pleural effusion. The visualized bony structures of the thorax are intact. Telemetry leads overlie the chest. IMPRESSION: Cardiomegaly with vascular congestion and probable interstitial edema. Electronically Signed   By: Kennith Center M.D.   On: 06/15/2018 21:28   ____________________________________________    PROCEDURES  Procedure(s) performed: None  Procedures  Critical Care performed: CRITICAL CARE Performed by: Jeanmarie Plant   Total critical care time: 40 minutes  Critical care time was exclusive of separately billable procedures and treating other patients.  Critical care was necessary to treat or prevent imminent or life-threatening deterioration.  Critical care was time spent personally by me on the following activities: development of treatment plan with patient and/or surrogate as well as nursing,  discussions with consultants, evaluation of patient's response to treatment, examination of patient, obtaining history from patient or surrogate, ordering and performing treatments and interventions, ordering and review of laboratory studies, ordering and review of radiographic studies, pulse oximetry and re-evaluation of patient's condition.   ____________________________________________   INITIAL IMPRESSION / ASSESSMENT AND PLAN / ED COURSE  Pertinent labs & imaging results that were available during my care of the patient were reviewed by me and considered in my medical decision making (see chart for details).  Here with atrial fibrillation with rapid ventricular response and feels unwell as a result.  Did start her on Cardizem, her heart rate is coming down and she feels much much better she states.  TSH is reassuring, she does have some mild edema on her chest x-ray but her vitals are otherwise reassuring and I suspect that once we have better forward squeeze we will have some clearance of that.  I have forestalled giving Lasix as a result as her creatinine is slightly elevated.  Hospitalist, Dr. Anne Hahn, will reassess this issue after a brief period and see if he would like to add Lasix.  Appreciate consult.  She is admitted to the hospitalist service.  Interobserver documenting heart rates that were lower, but that was what was read on the Dinamap and then the nurse stated she rechecked it and it was in the 70s however the moment I got in the room and tracked very well in the 160s exactly with the monitor so I am not sure what that was.  No evidence of bradycardia noted here we have been watching her on the monitor.  On the Cardizem drip, her heart rate is now gradually and appropriately come down to the low 100s and we will continue all monitoring her on the monitor    ____________________________________________   FINAL CLINICAL IMPRESSION(S) / ED DIAGNOSES  Final diagnoses:  Chest pain       This chart was dictated using voice recognition software.  Despite best efforts to proofread,  errors can occur which can change meaning.      Jeanmarie Plant, MD 06/15/18 2208    Jeanmarie Plant, MD 06/15/18 2208

## 2018-06-15 NOTE — ED Triage Notes (Signed)
Pt presents w/ c/o nausea all day today, woke w/ nausea. Pt c/o chest pain and dyspnea. Pt states this is new onset. Pt is HoH. Pt tachypneic after walking from car to triage room.

## 2018-06-16 ENCOUNTER — Encounter (HOSPITAL_COMMUNITY): Payer: Self-pay | Admitting: Internal Medicine

## 2018-06-16 ENCOUNTER — Other Ambulatory Visit: Payer: Self-pay

## 2018-06-16 ENCOUNTER — Inpatient Hospital Stay (HOSPITAL_COMMUNITY)
Admission: EM | Admit: 2018-06-16 | Discharge: 2018-06-22 | DRG: 177 | Disposition: A | Discharge status: Home / Self Care | Payer: Medicaid Other | Source: Other Acute Inpatient Hospital | Attending: Internal Medicine | Admitting: Internal Medicine

## 2018-06-16 DIAGNOSIS — Z800 Family history of malignant neoplasm of digestive organs: Secondary | ICD-10-CM | POA: Diagnosis not present

## 2018-06-16 DIAGNOSIS — R6889 Other general symptoms and signs: Secondary | ICD-10-CM

## 2018-06-16 DIAGNOSIS — Z833 Family history of diabetes mellitus: Secondary | ICD-10-CM

## 2018-06-16 DIAGNOSIS — E1165 Type 2 diabetes mellitus with hyperglycemia: Secondary | ICD-10-CM | POA: Diagnosis not present

## 2018-06-16 DIAGNOSIS — N179 Acute kidney failure, unspecified: Secondary | ICD-10-CM | POA: Diagnosis present

## 2018-06-16 DIAGNOSIS — I5033 Acute on chronic diastolic (congestive) heart failure: Secondary | ICD-10-CM | POA: Diagnosis present

## 2018-06-16 DIAGNOSIS — I4891 Unspecified atrial fibrillation: Secondary | ICD-10-CM | POA: Diagnosis not present

## 2018-06-16 DIAGNOSIS — E785 Hyperlipidemia, unspecified: Secondary | ICD-10-CM | POA: Diagnosis present

## 2018-06-16 DIAGNOSIS — H409 Unspecified glaucoma: Secondary | ICD-10-CM | POA: Diagnosis not present

## 2018-06-16 DIAGNOSIS — Z79899 Other long term (current) drug therapy: Secondary | ICD-10-CM

## 2018-06-16 DIAGNOSIS — E119 Type 2 diabetes mellitus without complications: Secondary | ICD-10-CM

## 2018-06-16 DIAGNOSIS — I4892 Unspecified atrial flutter: Secondary | ICD-10-CM | POA: Diagnosis present

## 2018-06-16 DIAGNOSIS — I10 Essential (primary) hypertension: Secondary | ICD-10-CM | POA: Diagnosis present

## 2018-06-16 DIAGNOSIS — Z20822 Contact with and (suspected) exposure to covid-19: Secondary | ICD-10-CM | POA: Diagnosis present

## 2018-06-16 DIAGNOSIS — J9601 Acute respiratory failure with hypoxia: Secondary | ICD-10-CM | POA: Diagnosis present

## 2018-06-16 DIAGNOSIS — I130 Hypertensive heart and chronic kidney disease with heart failure and stage 1 through stage 4 chronic kidney disease, or unspecified chronic kidney disease: Secondary | ICD-10-CM | POA: Diagnosis present

## 2018-06-16 DIAGNOSIS — J45909 Unspecified asthma, uncomplicated: Secondary | ICD-10-CM | POA: Diagnosis present

## 2018-06-16 DIAGNOSIS — J1289 Other viral pneumonia: Secondary | ICD-10-CM | POA: Diagnosis present

## 2018-06-16 DIAGNOSIS — Z7901 Long term (current) use of anticoagulants: Secondary | ICD-10-CM

## 2018-06-16 DIAGNOSIS — E662 Morbid (severe) obesity with alveolar hypoventilation: Secondary | ICD-10-CM | POA: Diagnosis present

## 2018-06-16 DIAGNOSIS — I480 Paroxysmal atrial fibrillation: Secondary | ICD-10-CM | POA: Diagnosis present

## 2018-06-16 DIAGNOSIS — I959 Hypotension, unspecified: Secondary | ICD-10-CM | POA: Diagnosis not present

## 2018-06-16 DIAGNOSIS — Z6841 Body Mass Index (BMI) 40.0 and over, adult: Secondary | ICD-10-CM

## 2018-06-16 DIAGNOSIS — Z7984 Long term (current) use of oral hypoglycemic drugs: Secondary | ICD-10-CM | POA: Diagnosis not present

## 2018-06-16 DIAGNOSIS — G43909 Migraine, unspecified, not intractable, without status migrainosus: Secondary | ICD-10-CM | POA: Diagnosis present

## 2018-06-16 DIAGNOSIS — B342 Coronavirus infection, unspecified: Secondary | ICD-10-CM | POA: Insufficient documentation

## 2018-06-16 DIAGNOSIS — R079 Chest pain, unspecified: Secondary | ICD-10-CM | POA: Diagnosis not present

## 2018-06-16 DIAGNOSIS — N183 Chronic kidney disease, stage 3 (moderate): Secondary | ICD-10-CM | POA: Diagnosis present

## 2018-06-16 DIAGNOSIS — J984 Other disorders of lung: Secondary | ICD-10-CM | POA: Diagnosis present

## 2018-06-16 HISTORY — DX: Type 2 diabetes mellitus without complications: E11.9

## 2018-06-16 HISTORY — DX: Essential (primary) hypertension: I10

## 2018-06-16 HISTORY — DX: Hyperlipidemia, unspecified: E78.5

## 2018-06-16 LAB — BASIC METABOLIC PANEL
Anion gap: 11 (ref 5–15)
BUN: 28 mg/dL — ABNORMAL HIGH (ref 8–23)
CO2: 21 mmol/L — ABNORMAL LOW (ref 22–32)
Calcium: 8.3 mg/dL — ABNORMAL LOW (ref 8.9–10.3)
Chloride: 104 mmol/L (ref 98–111)
Creatinine, Ser: 1.23 mg/dL — ABNORMAL HIGH (ref 0.44–1.00)
GFR calc Af Amer: 53 mL/min — ABNORMAL LOW (ref >60)
GFR calc non Af Amer: 46 mL/min — ABNORMAL LOW (ref >60)
Glucose, Bld: 235 mg/dL — ABNORMAL HIGH (ref 70–99)
Potassium: 4.3 mmol/L (ref 3.5–5.1)
Sodium: 136 mmol/L (ref 135–145)

## 2018-06-16 LAB — URINALYSIS, COMPLETE (UACMP) WITH MICROSCOPIC
Bacteria, UA: NONE SEEN
Bilirubin Urine: NEGATIVE
Glucose, UA: NEGATIVE mg/dL
Hgb urine dipstick: NEGATIVE
Ketones, ur: 5 mg/dL — AB
Nitrite: NEGATIVE
Protein, ur: 30 mg/dL — AB
Specific Gravity, Urine: 1.013 (ref 1.005–1.030)
pH: 7 (ref 5.0–8.0)

## 2018-06-16 LAB — CBC
HCT: 43.4 % (ref 36.0–46.0)
Hemoglobin: 13.5 g/dL (ref 12.0–15.0)
MCH: 30.1 pg (ref 26.0–34.0)
MCHC: 31.1 g/dL (ref 30.0–36.0)
MCV: 96.9 fL (ref 80.0–100.0)
Platelets: 142 10*3/uL — ABNORMAL LOW (ref 150–400)
RBC: 4.48 MIL/uL (ref 3.87–5.11)
RDW: 13 % (ref 11.5–15.5)
WBC: 6.8 10*3/uL (ref 4.0–10.5)
nRBC: 0 % (ref 0.0–0.2)

## 2018-06-16 LAB — GLUCOSE, CAPILLARY
Glucose-Capillary: 161 mg/dL — ABNORMAL HIGH (ref 70–99)
Glucose-Capillary: 225 mg/dL — ABNORMAL HIGH (ref 70–99)
Glucose-Capillary: 282 mg/dL — ABNORMAL HIGH (ref 70–99)
Glucose-Capillary: 294 mg/dL — ABNORMAL HIGH (ref 70–99)

## 2018-06-16 LAB — TROPONIN I
Troponin I: <0.03 ng/mL (ref <0.03)
Troponin I: <0.03 ng/mL (ref <0.03)

## 2018-06-16 LAB — SARS CORONAVIRUS 2 BY RT PCR (HOSPITAL ORDER, PERFORMED IN ~~LOC~~ HOSPITAL LAB): SARS Coronavirus 2: POSITIVE — AB

## 2018-06-16 MED ORDER — DILTIAZEM HCL 30 MG PO TABS
60.0000 mg | ORAL_TABLET | Freq: Three times a day (TID) | ORAL | Status: DC
  Administered 2018-06-17 – 2018-06-18 (×3): 60 mg via ORAL
  Filled 2018-06-16: qty 1
  Filled 2018-06-16 (×3): qty 2
  Filled 2018-06-16: qty 1
  Filled 2018-06-16: qty 2
  Filled 2018-06-16: qty 1

## 2018-06-16 MED ORDER — SODIUM CHLORIDE 0.9 % IV SOLN
250.0000 mL | INTRAVENOUS | Status: DC | PRN
  Administered 2018-06-17: 10 mL via INTRAVENOUS
  Administered 2018-06-19: 05:00:00 250 mL via INTRAVENOUS

## 2018-06-16 MED ORDER — ACETAMINOPHEN 325 MG PO TABS
650.0000 mg | ORAL_TABLET | Freq: Four times a day (QID) | ORAL | Status: DC | PRN
  Administered 2018-06-17 – 2018-06-18 (×2): 650 mg via ORAL
  Filled 2018-06-16 (×2): qty 2

## 2018-06-16 MED ORDER — INSULIN ASPART 100 UNIT/ML ~~LOC~~ SOLN: 0.0000 [IU] | Freq: Every day | SUBCUTANEOUS | Status: DC

## 2018-06-16 MED ORDER — NITROGLYCERIN 0.4 MG SL SUBL
0.4000 mg | SUBLINGUAL_TABLET | SUBLINGUAL | Status: DC | PRN
  Administered 2018-06-16: 09:00:00 0.4 mg via SUBLINGUAL
  Filled 2018-06-16: qty 1

## 2018-06-16 MED ORDER — BISACODYL 5 MG PO TBEC: 5.0000 mg | DELAYED_RELEASE_TABLET | Freq: Every day | ORAL | Status: DC | PRN

## 2018-06-16 MED ORDER — INSULIN ASPART 100 UNIT/ML ~~LOC~~ SOLN
0.0000 [IU] | Freq: Three times a day (TID) | SUBCUTANEOUS | Status: DC
  Administered 2018-06-17: 3 [IU] via SUBCUTANEOUS
  Administered 2018-06-17: 5 [IU] via SUBCUTANEOUS
  Administered 2018-06-17: 3 [IU] via SUBCUTANEOUS

## 2018-06-16 MED ORDER — DILTIAZEM HCL 30 MG PO TABS
60.0000 mg | ORAL_TABLET | Freq: Four times a day (QID) | ORAL | Status: DC
  Administered 2018-06-16: 60 mg via ORAL
  Filled 2018-06-16: qty 2

## 2018-06-16 MED ORDER — DILTIAZEM HCL 60 MG PO TABS: 60.0000 mg | ORAL_TABLET | Freq: Three times a day (TID) | ORAL | Status: DC

## 2018-06-16 MED ORDER — DOCUSATE SODIUM 100 MG PO CAPS
100.0000 mg | ORAL_CAPSULE | Freq: Two times a day (BID) | ORAL | Status: DC
  Administered 2018-06-17 – 2018-06-22 (×8): 100 mg via ORAL
  Filled 2018-06-16 (×10): qty 1

## 2018-06-16 MED ORDER — ACETAMINOPHEN 650 MG RE SUPP: 650.0000 mg | Freq: Four times a day (QID) | RECTAL | Status: DC | PRN

## 2018-06-16 MED ORDER — DILTIAZEM HCL 100 MG IV SOLR: 5.0000 mg/h | INTRAVENOUS | 0 refills | Status: DC

## 2018-06-16 MED ORDER — ONDANSETRON HCL 4 MG/2ML IJ SOLN
4.0000 mg | Freq: Four times a day (QID) | INTRAMUSCULAR | Status: DC | PRN
  Administered 2018-06-18 – 2018-06-20 (×2): 4 mg via INTRAVENOUS
  Filled 2018-06-16 (×2): qty 2

## 2018-06-16 MED ORDER — ONDANSETRON HCL 4 MG PO TABS: 4.0000 mg | ORAL_TABLET | Freq: Four times a day (QID) | ORAL | Status: DC | PRN

## 2018-06-16 MED ORDER — TRAMADOL HCL 50 MG PO TABS: 50.0000 mg | ORAL_TABLET | Freq: Four times a day (QID) | ORAL | Status: DC | PRN

## 2018-06-16 MED ORDER — METOPROLOL TARTRATE 5 MG/5ML IV SOLN
5.0000 mg | INTRAVENOUS | Status: DC | PRN
  Filled 2018-06-16: qty 5

## 2018-06-16 MED ORDER — METOPROLOL TARTRATE 5 MG/5ML IV SOLN
10.0000 mg | Freq: Once | INTRAVENOUS | Status: AC
  Administered 2018-06-16: 10 mg via INTRAVENOUS
  Filled 2018-06-16: qty 10

## 2018-06-16 MED ORDER — ATORVASTATIN CALCIUM 40 MG PO TABS: 40.0000 mg | ORAL_TABLET | Freq: Every day | ORAL | Status: AC

## 2018-06-16 MED ORDER — NITROGLYCERIN 0.4 MG SL SUBL: 0.4000 mg | SUBLINGUAL_TABLET | SUBLINGUAL | Status: DC | PRN

## 2018-06-16 MED ORDER — METOPROLOL TARTRATE 25 MG PO TABS
100.0000 mg | ORAL_TABLET | Freq: Two times a day (BID) | ORAL | Status: DC
  Administered 2018-06-16 – 2018-06-22 (×12): 100 mg via ORAL
  Filled 2018-06-16 (×12): qty 4

## 2018-06-16 MED ORDER — SODIUM CHLORIDE 0.9% FLUSH
3.0000 mL | Freq: Two times a day (BID) | INTRAVENOUS | Status: DC
  Administered 2018-06-16 – 2018-06-21 (×8): 3 mL via INTRAVENOUS

## 2018-06-16 MED ORDER — FUROSEMIDE 10 MG/ML IJ SOLN
40.0000 mg | Freq: Once | INTRAMUSCULAR | Status: AC
  Administered 2018-06-16: 40 mg via INTRAVENOUS
  Filled 2018-06-16: qty 4

## 2018-06-16 MED ORDER — FUROSEMIDE 10 MG/ML IJ SOLN
20.0000 mg | Freq: Once | INTRAMUSCULAR | Status: AC
  Administered 2018-06-16: 20 mg via INTRAVENOUS
  Filled 2018-06-16: qty 2

## 2018-06-16 MED ORDER — TRAZODONE HCL 50 MG PO TABS: 50.0000 mg | ORAL_TABLET | Freq: Every evening | ORAL | Status: DC | PRN

## 2018-06-16 MED ORDER — DILTIAZEM HCL 60 MG PO TABS
60.0000 mg | ORAL_TABLET | Freq: Three times a day (TID) | ORAL | Status: DC
  Filled 2018-06-16 (×3): qty 1

## 2018-06-16 MED ORDER — METOPROLOL TARTRATE 50 MG PO TABS
100.0000 mg | ORAL_TABLET | Freq: Two times a day (BID) | ORAL | Status: DC
  Administered 2018-06-16: 08:00:00 100 mg via ORAL
  Filled 2018-06-16: qty 2

## 2018-06-16 MED ORDER — ATORVASTATIN CALCIUM 40 MG PO TABS: 40.0000 mg | ORAL_TABLET | Freq: Every day | ORAL | Status: DC

## 2018-06-16 MED ORDER — DILTIAZEM HCL-DEXTROSE 100-5 MG/100ML-% IV SOLN (PREMIX)
5.0000 mg/h | INTRAVENOUS | Status: DC
  Administered 2018-06-16: 5 mg/h via INTRAVENOUS
  Filled 2018-06-16: qty 100

## 2018-06-16 MED ORDER — DILTIAZEM HCL 30 MG PO TABS
60.0000 mg | ORAL_TABLET | Freq: Three times a day (TID) | ORAL | Status: DC
  Administered 2018-06-16 (×2): 60 mg via ORAL
  Filled 2018-06-16 (×3): qty 2

## 2018-06-16 MED ORDER — LEVALBUTEROL TARTRATE 45 MCG/ACT IN AERO
2.0000 | INHALATION_SPRAY | Freq: Four times a day (QID) | RESPIRATORY_TRACT | Status: DC
  Administered 2018-06-17 (×2): 2 via RESPIRATORY_TRACT
  Filled 2018-06-16: qty 15

## 2018-06-16 MED ORDER — ATORVASTATIN CALCIUM 40 MG PO TABS
40.0000 mg | ORAL_TABLET | Freq: Every day | ORAL | Status: DC
  Administered 2018-06-17 – 2018-06-21 (×5): 40 mg via ORAL
  Filled 2018-06-16 (×4): qty 1
  Filled 2018-06-16: qty 4
  Filled 2018-06-16 (×2): qty 1

## 2018-06-16 MED ORDER — APIXABAN 5 MG PO TABS
5.0000 mg | ORAL_TABLET | Freq: Two times a day (BID) | ORAL | Status: DC
  Administered 2018-06-16 – 2018-06-22 (×12): 5 mg via ORAL
  Filled 2018-06-16 (×15): qty 1

## 2018-06-16 MED ORDER — SODIUM CHLORIDE 0.9% FLUSH: 3.0000 mL | INTRAVENOUS | Status: DC | PRN

## 2018-06-16 NOTE — Progress Notes (Signed)
Pt with medications in room, in sealed medication bag.

## 2018-06-16 NOTE — Plan of Care (Signed)
  Problem: Clinical Measurements: Goal: Ability to maintain clinical measurements within normal limits will improve Outcome: Progressing Note:  Patients heart rate came down to 90s-100s.

## 2018-06-16 NOTE — Progress Notes (Addendum)
Patient's heart rate ranging in the 130s 140s this morning with the heart rate going up to the 150s at one point. Patient lying in bed with no complaints. Cardizem drip running at 10ml/hr. PO scheduled meds given. Dr. Juliene Pina notified.   1130: patient's heart rate down to 90s-100s after PRN and scheduled meds given. Chest pain free as well. Will continue to monitor and await for bed at [facility].

## 2018-06-16 NOTE — Progress Notes (Signed)
Dr. Juliann Pares paged to notify about patients current heart rate and cardizem drip status. Heart rate currently in the 90s-100s.. Verbal orders with read back to discontinue cardizem drip and modify PO cardizem 60mg  to Q6 hours. Drip will be discontinued and PO order modified. Will continue to monitor patient.   Report given to Cindy,RN at [facility]. Waiting for [facility].

## 2018-06-16 NOTE — H&P (Signed)
History and Physical    Wendy Summers UJW:[id number] DOB: 11-08-1950 DOA: 06/16/2018  [facility]   Patient coming from: Home, [place] [facility] [facility]    Chief Complaint: Positive novel coronavirus infection, atrial fibrillation with rapid ventricular response  HPI: Wendy Bossancy J Romack is a 68 y.o. female with medical history significant of hypertension, hyperlipidemia, type 2 diabetes on oral hypoglycemics, asthma, paroxysmal atrial fibrillation on apixaban who presented to [facility] on 06/15/2018 with nausea and vague chest pain and was found to be in atrial fibrillation with rapid ventricular response.  Patient is extremely poor historian but reports that several days prior to presentation she began to have diffuse nonspecific chest pain.  She cannot describe this chest pain much further reports it did not radiate.  She also noticed some audible wheezing.  She is not sure if she has had any orthopnea.  She noted some increasing lower extremity edema.  She felt unwell and had some nausea but no emesis.  She did not have any abdominal pain.  She did not have any diarrhea.  She noted a nonproductive cough but no congestion or rhinorrhea.  She reports compliance with her medications at home.  She denies any syncope or so or presyncope.  [facility].  Patient's husband was recently transferred to this hospital on 06/15/2018 with novel coronavirus infection.  [facility] patient was initially on diltiazem drip and then switched to oral diltiazem.  Due to her husband testing positive for the novel coronavirus patient was tested as well and ended up being positive.  She was transferred here.  Currently she only reports some shortness of breath on exertion diffuse wheezing but denies any chest pain, cough, congestion, fever, rhinorrhea, nausea, vomiting, diarrhea.  ED Course: See above  Review of Systems: As per HPI otherwise  10 point review of systems negative.    Past Medical History:  Diagnosis Date  . 2019 novel coronavirus detected 06/16/2018  . Asthma   . Bronchitis   . Diabetes mellitus without complication (HCC)   . HLD (hyperlipidemia) 06/16/2018  . HTN (hypertension) 06/16/2018  . Migraine   . T2DM (type 2 diabetes mellitus) (HCC) 06/16/2018    Past Surgical History:  Procedure Laterality Date  . CHOLECYSTECTOMY    . NO PAST SURGERIES       reports that she has never smoked. She has never used smokeless tobacco. She reports that she does not drink alcohol or use drugs.  No Known Allergies  Family History  Problem Relation Age of Onset  . Diabetes Mellitus II Mother   . Pancreatic cancer Sister      Prior to Admission medications   Medication Sig Start Date End Date Taking? Authorizing Provider  albuterol (PROVENTIL) (2.5 MG/3ML) 0.083% nebulizer solution Take 2.5 mg by nebulization every 4 (four) hours as needed for wheezing or shortness of breath.    [provider]  apixaban (ELIQUIS) 5 MG TABS tablet Take 5 mg by mouth 2 (two) times daily.     [provider]  atorvastatin (LIPITOR) 40 MG tablet Take 1 tablet (40 mg total) by mouth daily. 06/16/18   Adrian SaranMody, Sital, MD  diltiazem (CARDIZEM) 60 MG tablet Take 1 tablet (60 mg total) by mouth every 8 (eight) hours. 06/16/18   Adrian SaranMody, Sital, MD  diltiazem 100 mg in dextrose 5 % 100 mL Inject 5-15 mg/hr into the vein continuous. 06/16/18   Adrian SaranMody, Sital, MD  metFORMIN (GLUCOPHAGE-XR)  500 MG 24 hr tablet Take 2,000 mg by mouth daily. 01/18/18   [provider]  metoprolol tartrate (LOPRESSOR) 100 MG tablet Take 100 mg by mouth 2 (two) times daily. 04/08/18   [provider]  SIMBRINZA 1-0.2 % SUSP Apply 1 drop to eye 3 (three) times daily.  04/08/18   [provider]    Physical Exam: There were no vitals filed for this visit.  Constitutional: NAD, calm, comfortable There were no vitals filed for this visit.  Eyes: Anicteric sclera ENMT: Poor dentition.  Neck: normal, supple Respiratory: No increased work of breathing, audible wheezing, scattered crackles at bases, diffuse wheezing throughout, scattered rhonchi.  Cardiovascular: Tachycardic, irregularly irregular, no murmurs.  Abdomen: Soft, nontender, no rebound or guarding, plus bowel sounds Musculoskeletal: 2+ lower extremity edema Skin: no acute rash, eczematous appearing rash of her face Neurologic: Grossly intact, moving all extremities Psychiatric: Judgment and insight are poor    Labs on Admission: I have personally reviewed following labs and imaging studies  CBC: Recent Labs  Lab 06/15/18 2036 06/16/18 0548  WBC 7.3 6.8  HGB 13.5 13.5  HCT 41.8 43.4  MCV 93.3 96.9  PLT 313 142*   Basic Metabolic Panel: Recent Labs  Lab 06/15/18 2036 06/16/18 0548  NA 134* 136  K 4.8 4.3  CL 99 104  CO2 21* 21*  GLUCOSE 266* 235*  BUN 35* 28*  CREATININE 1.39* 1.23*  CALCIUM 8.6* 8.3*  MG 1.9  --    GFR: Estimated Creatinine Clearance: 50.9 mL/min (A) (by C-G formula based on SCr of 1.23 mg/dL (H)). Liver Function Tests: No results for input(s): AST, ALT, ALKPHOS, BILITOT, PROT, ALBUMIN in the last 168 hours. No results for input(s): LIPASE, AMYLASE in the last 168 hours. No results for input(s): AMMONIA in the last 168 hours. Coagulation Profile: No results for input(s): INR, PROTIME in the last 168 hours. Cardiac Enzymes: Recent Labs  Lab 06/15/18 2036 06/16/18 0912 06/16/18 1438  TROPONINI <0.03 <0.03 <0.03   BNP (last 3 results) No results for input(s): PROBNP in the last 8760 hours. HbA1C: No results for input(s): HGBA1C in the last 72 hours. CBG: Recent Labs  Lab 06/15/18 2352 06/16/18 0726 06/16/18 1147 06/16/18 1625  GLUCAP 221* 225* 282* 294*   Lipid Profile: No results for input(s): CHOL, HDL, LDLCALC, TRIG, CHOLHDL, LDLDIRECT in the last 72 hours. Thyroid Function Tests: Recent Labs    06/15/18  2036  TSH 1.181   Anemia Panel: No results for input(s): VITAMINB12, FOLATE, FERRITIN, TIBC, IRON, RETICCTPCT in the last 72 hours. Urine analysis:    Component Value Date/Time   COLORURINE YELLOW (A) 06/16/2018 0900   APPEARANCEUR HAZY (A) 06/16/2018 0900   APPEARANCEUR Cloudy 10/02/2013 0058   LABSPEC 1.013 06/16/2018 0900   LABSPEC 1.029 10/02/2013 0058   PHURINE 7.0 06/16/2018 0900   GLUCOSEU NEGATIVE 06/16/2018 0900   GLUCOSEU Negative 10/02/2013 0058   HGBUR NEGATIVE 06/16/2018 0900   BILIRUBINUR NEGATIVE 06/16/2018 0900   BILIRUBINUR Negative 10/02/2013 0058   KETONESUR 5 (A) 06/16/2018 0900   PROTEINUR 30 (A) 06/16/2018 0900   NITRITE NEGATIVE 06/16/2018 0900   LEUKOCYTESUR SMALL (A) 06/16/2018 0900   LEUKOCYTESUR 2+ 10/02/2013 0058    Radiological Exams on Admission: Dg Chest Port 1 View  Result Date: 06/15/2018 CLINICAL DATA:  Nausea. EXAM: PORTABLE CHEST 1 VIEW COMPARISON:  03/08/2018 FINDINGS: 2038 hours. Patient rotated to the right. The cardio pericardial silhouette is enlarged. There is pulmonary vascular congestion without overt pulmonary  edema. Probable interstitial pulmonary edema. No substantial pleural effusion. The visualized bony structures of the thorax are intact. Telemetry leads overlie the chest. IMPRESSION: Cardiomegaly with vascular congestion and probable interstitial edema. Electronically Signed   By: Kennith Center M.D.   On: 06/15/2018 21:28    EKG: Independently reviewed.  Irregularly irregular, atrial fibrillation with rapid ventricular response, PVCs, no acute ST segment changes  Assessment/Plan Active Problems:   Atrial fibrillation with RVR (HCC)   Asthma   Atrial flutter with rapid ventricular response (HCC)   2019 novel coronavirus detected   T2DM (type 2 diabetes mellitus) (HCC)   HLD (hyperlipidemia)   HTN (hypertension)   #) Novel coronavirus infection: At this time it is unclear if patient is symptomatic from this.  Her chest  x-ray shows only evidence of pulmonary congestion consistent with heart failure due to atrial fibrillation with rapid ventricular response.  She also appears to be grossly fluid overloaded and is wheezing.  She additionally carries a diagnosis of asthma as well.  She is unfortunately not had a CT scan. - Isolation with airborne droplet precautions -We will consider CTA of the chest if patient continues to worsen from a respiratory status -We will order d-dimer, LDH for prognosis  #) Asthma with wheezing: At this time it does not appear clear if patient has wheezing due to pneumonitis from infection versus asthma exacerbation caused by viral pneumonitis versus cardiac wheezing.  The x-ray with pulmonary vascular congestion does favor cardiac wheezing.  Regardless there is some concern about initiating steroids with coronavirus patients at this time she does not appear to be in respiratory distress.  Would strongly consider steroids if she does appear to be wheezing with respiratory distress though as it is not clear that the clinical outcomes are any better or worsen patients were given steroids. - Scheduled MDI short acting bronchodilator, will use levalbuterol though there is no clear clinical evidence that levalbuterol induces less tachycardia -We will hold on steroids at this time  #) Pulmonary vascular congestion: Patient does not have a recent echo showing evidence of diastolic dysfunction though with her comorbidities suspect this is the most likely cause. -Echo per above - We will give one-time dose of IV furosemide and reassess  #) AKI: Patient was admitted creatinine to 1.39 and mild AKI.  This is slightly improved to 1.23 on transfer. -Hold nephrotoxins -Judicious diuresis  #) Paroxysmal atrial fibrillation with rapid ventricular response: Patient did have a recent nuclear medicine study that showed a normal ejection fraction and no evidence of coronary artery disease but last echo was  from 2018. -We will order echo -Continue diltiazem drip -Continue oral diltiazem 60 mg every 8 hours -Continue home metoprolol tartrate 100 mg twice daily -We will consider digoxin versus amiodarone pending clinical course if patient's rate cannot be controlled -Continue apixaban 5 mg twice daily  #) Hypertension/hyperlipidemia: -Beta-blockers per above -Continue atorvastatin 40 mg daily  #) Type 2 diabetes: -We will order hemoglobin A1c -Sliding scale insulin, AC at bedtime -Hold metformin 2000 mg daily  Fluids: Hold Electrolytes: Monitor and supplement Nutrition: Carb restricted/heart healthy diet  Prophylaxis: On apixaban  Disposition: Pending heart rate control patient can isolate home if she is not on significant amounts of oxygen  Full code  Delaine Lame MD [facility]   If 7PM-7AM, please contact night-coverage [email or web address] Password [facility]  06/16/2018, 10:06 PM

## 2018-06-16 NOTE — Progress Notes (Signed)
Notifed by ER doctor and Hospitalists of pt's husband being positive for COVID-19. Pt placed on isolation with contact and droplet percautions. Pt moved to isolation room. Covid test ordered. Will continue to monitor and assess.

## 2018-06-16 NOTE — Discharge Summary (Signed)
[facility] [place] [facility]   PATIENT NAME: Wendy Summers    MR#:  [id number]  DATE OF BIRTH:  [birth year]  DATE OF ADMISSION:  06/15/2018 ADMITTING PHYSICIAN: Oralia Manis, MD  DATE OF DISCHARGE: 06/16/2018  PRIMARY CARE PHYSICIAN: [facility]    ADMISSION DIAGNOSIS:  Chest pain [R07.9]  DISCHARGE DIAGNOSIS:  Principal Problem:   Atrial fibrillation with RVR (HCC) Active Problems:   AKI (acute kidney injury) (HCC)   Diabetes (HCC)   Asthma   Suspected 2019 Novel Coronavirus Infection   SECONDARY DIAGNOSIS:   Past Medical History:  Diagnosis Date  . Asthma   . Bronchitis   . Diabetes mellitus without complication (HCC)   . Migraine     HOSPITAL COURSE:   68 year old female with history of PAF who presents with palpitations and noted to be in atrial fibrillation with RVR.  1.  Atrial fibrillation with RVR: Patient was continued on metoprolol 100 mg p.o. twice daily.  She was started on diltiazem drip as well as oral diltiazem. IF this does not control hert rate then I suggest digoxin load or Amiodarone load. She will continue anticoagulation for stroke prevention with Eliquis.  TSH 1.181 2. Acute on chronic diastolic heart failure: CXR consistent with pulmonary edema.  She has been provided IV Lasix. Consider scheduling IV Lasix. Monitor intake and output and daily weight  3.  Diabetes: Continue sliding scale with ADA diet.  Patient is on metformin  4.  Hyperlipidemia: Continue statin  5.  Positive COVID-19: Patient was tested for COVID-19 as her husband tested positive and her test is positive.  She needs to be transferred to [facility] in [place].  Accepting physician Dr. Rito Ehrlich. Continue precuations   6.  Chest pain due to atrial fibrillation with RVR: Follow telemetry and troponins   DISCHARGE CONDITIONS AND DIET:   Patient will be transferred to [facility]  Cardiac diabetic  diet  CONSULTS OBTAINED:    DRUG ALLERGIES:  No Known Allergies  DISCHARGE MEDICATIONS:   Allergies as of 06/16/2018   No Known Allergies     Medication List    STOP taking these medications   amLODipine 10 MG tablet Commonly known as:  NORVASC   lisinopril 20 MG tablet Commonly known as:  ZESTRIL   nystatin powder Commonly known as:  MYCOSTATIN/NYSTOP     TAKE these medications   albuterol (2.5 MG/3ML) 0.083% nebulizer solution Commonly known as:  PROVENTIL Take 2.5 mg by nebulization every 4 (four) hours as needed for wheezing or shortness of breath.   apixaban 5 MG Tabs tablet Commonly known as:  ELIQUIS Take 5 mg by mouth 2 (two) times daily.   atorvastatin 40 MG tablet Commonly known as:  LIPITOR Take 1 tablet (40 mg total) by mouth daily.   diltiazem 100 mg in dextrose 5 % 100 mL Inject 5-15 mg/hr into the vein continuous.   diltiazem 60 MG tablet Commonly known as:  CARDIZEM Take 1 tablet (60 mg total) by mouth every 8 (eight) hours.   metFORMIN 500 MG 24 hr tablet Commonly known as:  GLUCOPHAGE-XR Take 2,000 mg by mouth daily.   metoprolol tartrate 100 MG tablet Commonly known as:  LOPRESSOR Take 100 mg by mouth 2 (two) times daily.   Simbrinza 1-0.2 % Susp Generic drug:  Brinzolamide-Brimonidine Apply 1 drop to eye 3 (three) times daily.         Today   CHIEF COMPLAINT:  Patient here  with chest pain shortness of breath   VITAL SIGNS:  Blood pressure 112/83, pulse (!) 104, temperature 99.1 F (37.3 C), temperature source Oral, resp. rate 20, height  (1.575 m), weight 103.8 kg, SpO2 93 %.   REVIEW OF SYSTEMS:  Review of Systems  Constitutional: Positive for malaise/fatigue. Negative for chills and fever.  HENT: Negative.  Negative for ear discharge, ear pain, hearing loss, nosebleeds and sore throat.   Eyes: Negative.  Negative for blurred vision and pain.  Respiratory: Positive for shortness of breath. Negative for cough,  hemoptysis and wheezing.   Cardiovascular: Positive for palpitations and leg swelling. Negative for chest pain.  Gastrointestinal: Negative.  Negative for abdominal pain, blood in stool, diarrhea, nausea and vomiting.  Genitourinary: Negative.  Negative for dysuria.  Musculoskeletal: Negative.  Negative for back pain.  Skin: Negative.   Neurological: Negative for dizziness, tremors, speech change, focal weakness, seizures and headaches.  Endo/Heme/Allergies: Negative.  Does not bruise/bleed easily.  Psychiatric/Behavioral: Negative.  Negative for depression, hallucinations and suicidal ideas.     PHYSICAL EXAMINATION:  GENERAL:  68 y.o.-year-old patient lying in the bed with no acute distress.   CARDIOVASCULAR:irr, irr tachycardic  PSYCHIATRIC: The patient is alert and oriented x 3.    DATA REVIEW:   CBC Recent Labs  Lab 06/16/18 0548  WBC 6.8  HGB 13.5  HCT 43.4  PLT 142*    Chemistries  Recent Labs  Lab 06/15/18 2036 06/16/18 0548  NA 134* 136  K 4.8 4.3  CL 99 104  CO2 21* 21*  GLUCOSE 266* 235*  BUN 35* 28*  CREATININE 1.39* 1.23*  CALCIUM 8.6* 8.3*  MG 1.9  --     Cardiac Enzymes Recent Labs  Lab 06/15/18 2036  TROPONINI <0.03    Microbiology Results  @  RADIOLOGY:  Dg Chest Port 1 View  Result Date: 06/15/2018 CLINICAL DATA:  Nausea. EXAM: PORTABLE CHEST 1 VIEW COMPARISON:  03/08/2018 FINDINGS: 2038 hours. Patient rotated to the right. The cardio pericardial silhouette is enlarged. There is pulmonary vascular congestion without overt pulmonary edema. Probable interstitial pulmonary edema. No substantial pleural effusion. The visualized bony structures of the thorax are intact. Telemetry leads overlie the chest. IMPRESSION: Cardiomegaly with vascular congestion and probable interstitial edema. Electronically Signed   By: Kennith Center M.D.   On: 06/15/2018 21:28      Allergies as of 06/16/2018   No Known Allergies     Medication  List    STOP taking these medications   amLODipine 10 MG tablet Commonly known as:  NORVASC   lisinopril 20 MG tablet Commonly known as:  ZESTRIL   nystatin powder Commonly known as:  MYCOSTATIN/NYSTOP     TAKE these medications   albuterol (2.5 MG/3ML) 0.083% nebulizer solution Commonly known as:  PROVENTIL Take 2.5 mg by nebulization every 4 (four) hours as needed for wheezing or shortness of breath.   apixaban 5 MG Tabs tablet Commonly known as:  ELIQUIS Take 5 mg by mouth 2 (two) times daily.   atorvastatin 40 MG tablet Commonly known as:  LIPITOR Take 1 tablet (40 mg total) by mouth daily.   diltiazem 100 mg in dextrose 5 % 100 mL Inject 5-15 mg/hr into the vein continuous.   diltiazem 60 MG tablet Commonly known as:  CARDIZEM Take 1 tablet (60 mg total) by mouth every 8 (eight) hours.   metFORMIN 500 MG 24 hr tablet Commonly known as:  GLUCOPHAGE-XR Take 2,000 mg by mouth daily.  metoprolol tartrate 100 MG tablet Commonly known as:  LOPRESSOR Take 100 mg by mouth 2 (two) times daily.   Simbrinza 1-0.2 % Susp Generic drug:  Brinzolamide-Brimonidine Apply 1 drop to eye 3 (three) times daily.          Management plans discussed with the patient and she is in agreement. Stable for discharge   Patient should follow up with cardiology after discharge  CODE STATUS:     Code Status Orders  (From admission, onward)         Start     Ordered   06/15/18 2353  Full code  Continuous     06/15/18 2352        Code Status History    Date Active Date Inactive Code Status Order ID Comments User Context   04/21/2018 0348 04/23/2018 1942 Full Code [id number]  Arnaldo Nataliamond, Michael S, MD Inpatient   02/25/2018 2225 03/02/2018 1922 Full Code [id number]  Auburn BilberryPatel, Shreyang, MD Inpatient   07/13/2016 0651 07/18/2016 1555 Full Code [id number]  Ihor AustinPyreddy, Pavan, MD Inpatient      TOTAL TIME TAKING CARE OF THIS PATIENT: 38 minutes.    Note: This dictation was prepared with  Dragon dictation along with smaller phrase technology. Any transcriptional errors that result from this process are unintentional.  Adrian SaranSital Xolani Degracia M.D on 06/16/2018 at 8:44 AM  Between 7am to 6pm - Pager - [phone] After 6pm go to [email or web address] - Social research officer, governmentpassword [email or web address] [email or web address]  [facility]  Office  [phone]  CC: Primary care physician; [facility]

## 2018-06-17 DIAGNOSIS — E785 Hyperlipidemia, unspecified: Secondary | ICD-10-CM

## 2018-06-17 DIAGNOSIS — J45909 Unspecified asthma, uncomplicated: Secondary | ICD-10-CM

## 2018-06-17 DIAGNOSIS — E119 Type 2 diabetes mellitus without complications: Secondary | ICD-10-CM

## 2018-06-17 DIAGNOSIS — I5033 Acute on chronic diastolic (congestive) heart failure: Secondary | ICD-10-CM | POA: Diagnosis present

## 2018-06-17 DIAGNOSIS — J9601 Acute respiratory failure with hypoxia: Secondary | ICD-10-CM | POA: Diagnosis present

## 2018-06-17 LAB — HEMOGLOBIN A1C
Hgb A1c MFr Bld: 7.5 % — ABNORMAL HIGH (ref 4.8–5.6)
Mean Plasma Glucose: 168.55 mg/dL

## 2018-06-17 LAB — BASIC METABOLIC PANEL
Anion gap: 11 (ref 5–15)
BUN: 36 mg/dL — ABNORMAL HIGH (ref 8–23)
CO2: 24 mmol/L (ref 22–32)
Calcium: 8.3 mg/dL — ABNORMAL LOW (ref 8.9–10.3)
Chloride: 99 mmol/L (ref 98–111)
Creatinine, Ser: 1.51 mg/dL — ABNORMAL HIGH (ref 0.44–1.00)
GFR calc Af Amer: 41 mL/min — ABNORMAL LOW (ref >60)
GFR calc non Af Amer: 36 mL/min — ABNORMAL LOW (ref >60)
Glucose, Bld: 241 mg/dL — ABNORMAL HIGH (ref 70–99)
Potassium: 4.1 mmol/L (ref 3.5–5.1)
Sodium: 134 mmol/L — ABNORMAL LOW (ref 135–145)

## 2018-06-17 LAB — GLUCOSE, CAPILLARY
Glucose-Capillary: 262 mg/dL — ABNORMAL HIGH (ref 70–99)
Glucose-Capillary: 231 mg/dL — ABNORMAL HIGH (ref 70–99)
Glucose-Capillary: 247 mg/dL — ABNORMAL HIGH (ref 70–99)
Glucose-Capillary: 232 mg/dL — ABNORMAL HIGH (ref 70–99)

## 2018-06-17 LAB — CBC
HCT: 39.7 % (ref 36.0–46.0)
Hemoglobin: 12.3 g/dL (ref 12.0–15.0)
MCH: 29.7 pg (ref 26.0–34.0)
MCHC: 31 g/dL (ref 30.0–36.0)
MCV: 95.9 fL (ref 80.0–100.0)
Platelets: 290 10*3/uL (ref 150–400)
RBC: 4.14 MIL/uL (ref 3.87–5.11)
RDW: 13 % (ref 11.5–15.5)
WBC: 6.6 10*3/uL (ref 4.0–10.5)
nRBC: 0 % (ref 0.0–0.2)

## 2018-06-17 LAB — C-REACTIVE PROTEIN: CRP: 8.8 mg/dL — ABNORMAL HIGH (ref <1.0)

## 2018-06-17 LAB — LACTATE DEHYDROGENASE: LDH: 198 U/L — ABNORMAL HIGH (ref 98–192)

## 2018-06-17 LAB — D-DIMER, QUANTITATIVE: D-Dimer, Quant: 0.89 ug/mL-FEU — ABNORMAL HIGH (ref 0.00–0.50)

## 2018-06-17 MED ORDER — AMIODARONE HCL IN DEXTROSE 360-4.14 MG/200ML-% IV SOLN
60.0000 mg/h | INTRAVENOUS | Status: AC
  Administered 2018-06-17: 60 mg/h via INTRAVENOUS
  Filled 2018-06-17: qty 200

## 2018-06-17 MED ORDER — FUROSEMIDE 10 MG/ML IJ SOLN
40.0000 mg | Freq: Every day | INTRAMUSCULAR | Status: DC
  Administered 2018-06-17 – 2018-06-18 (×2): 40 mg via INTRAVENOUS
  Filled 2018-06-17 (×2): qty 4

## 2018-06-17 MED ORDER — LEVALBUTEROL TARTRATE 45 MCG/ACT IN AERO
2.0000 | INHALATION_SPRAY | Freq: Three times a day (TID) | RESPIRATORY_TRACT | Status: DC
  Administered 2018-06-17 – 2018-06-22 (×16): 2 via RESPIRATORY_TRACT
  Filled 2018-06-17: qty 15

## 2018-06-17 MED ORDER — INSULIN ASPART 100 UNIT/ML ~~LOC~~ SOLN
0.0000 [IU] | Freq: Three times a day (TID) | SUBCUTANEOUS | Status: DC
  Administered 2018-06-18 (×2): 5 [IU] via SUBCUTANEOUS
  Administered 2018-06-18: 08:00:00 11 [IU] via SUBCUTANEOUS
  Administered 2018-06-19: 3 [IU] via SUBCUTANEOUS
  Administered 2018-06-19 (×2): 5 [IU] via SUBCUTANEOUS
  Administered 2018-06-20: 3 [IU] via SUBCUTANEOUS
  Administered 2018-06-20: 8 [IU] via SUBCUTANEOUS
  Administered 2018-06-20 – 2018-06-21 (×4): 3 [IU] via SUBCUTANEOUS
  Administered 2018-06-22: 8 [IU] via SUBCUTANEOUS
  Administered 2018-06-22: 3 [IU] via SUBCUTANEOUS

## 2018-06-17 MED ORDER — AMIODARONE HCL IN DEXTROSE 360-4.14 MG/200ML-% IV SOLN
30.0000 mg/h | INTRAVENOUS | Status: DC
  Administered 2018-06-17 – 2018-06-19 (×5): 30 mg/h via INTRAVENOUS
  Filled 2018-06-17 (×10): qty 200

## 2018-06-17 MED ORDER — INSULIN ASPART 100 UNIT/ML ~~LOC~~ SOLN
0.0000 [IU] | Freq: Every day | SUBCUTANEOUS | Status: DC
  Administered 2018-06-17 – 2018-06-18 (×2): 2 [IU] via SUBCUTANEOUS
  Administered 2018-06-21: 3 [IU] via SUBCUTANEOUS

## 2018-06-17 NOTE — Progress Notes (Signed)
BP 82/40, hr 100's-110's, cardizem drip @ 5mg /hr, received IV lasix and po metoprolol at 2330, placed on o2 at 3 liters due to sats of 84-87% while sleeping, spoke with hospitalist (Dr. Clearnce Sorrel), orders given to hold po Cardizem and leave Cardizem drip at current rate as long as HR stays in the 100's-110's, will continue to monitor, pt asymptomatic at this time

## 2018-06-17 NOTE — Progress Notes (Signed)
PT Cancellation Note  Patient Details Name: Wendy Summers MRN: [id number] DOB: [birth year]   Cancelled Treatment:    Reason Eval/Treat Not Completed: Medical issues which prohibited therapy;  Pt with elevated HR earlier (130s at  Rest), now  on amiodarone drip with improvement, however BP remains soft. Pt with chest pain earlier today  when mobilizing OOB with RN--  will  hold PT for now and check on pt tomorrow or as schedule permits; Danna, RN aware and in agreement with plan  Drucilla Chalet, PT  Pager: [phone] Acute Rehab Dept [facility]): [phone] Lynnell Catalan Cell: [phone]   06/17/2018  [facility] 06/17/2018, 6:46 PM

## 2018-06-17 NOTE — Progress Notes (Addendum)
PROGRESS NOTE  Wendy Summers  ZOX:[id number] DOB: 08-26-1950 DOA: 06/16/2018 PCP: Inc, [facility]   Brief Narrative: Wendy Summers is a 68 y.o. female with a history of HTN, HLD, T2DM, BD-responsive restrictive lung disease, and PAF on eliquis who presented to [facility] ED 4/17 with nausea, vague chest pain, and feeling ill found to have AFib with rapid ventricular response. She began feeling ill about 2 weeks prior with intermittent fevers and some nonproductive cough. Her husband is currently admitted at [facility] for covid-19 infection. When her symptoms worsened and included chest pain and wheezing she sought care, was given diltiazem gtt for rate control and confirmed to be covid positive, transferred to [facility] 4/18. CXR demonstrated vascular congestion and BNP was elevated. She was given lasix and an inhaler for wheezing. She continues to have shortness of breath with any exertion and having high heart rate with rate control complicated by hypotension.  Assessment & Plan: Active Problems:   Atrial fibrillation with RVR (HCC)   Asthma   Atrial flutter with rapid ventricular response (HCC)   2019 novel coronavirus detected   T2DM (type 2 diabetes mellitus) (HCC)   HLD (hyperlipidemia)   HTN (hypertension)   Coronavirus infection  Acute hypoxic respiratory failure due to covid-19 infection and AFib with RVR causing acute on chronic diastolic CHF.  - Continue supplemental oxygen to maintain SpO2 >90%. Will keep under close monitoring.   PAF with RVR:  - Continue diltiatzem gtt, currently /hr with aims to discontinue and transition to po (  q8h home med).  - Continue metoprolol  po BID home medications - Will start amiodarone without bolus to avoid hypotension. Discussed with cardiology, Dr. Tenny Craw.  - Continue eliquis - TSH 1.181. Maintain K and Mg levels.   Acute on chronic diastolic CHF: Suspect tachycardia-mediated worsening. Has recent myoview showing preserved EF, low  risk.  - Will avoid exposure that would be required to repeat echo pending clinical trajectory.  - Repeat lasix dosing. Cr up and will be monitored. Strict I/O, daily weights discussed w/patient and RN today.  - Anticipate improved volume status once HR better controlled.  Covid-19 infection:  - Continue airborne, contact precautions. PPE including gown, gloves, face shield, cap, shoe covers, N-95 used during this encounter.  - Check daily labs: CBC w/diff, CMP, d-dimer, fibrinogen, ferritin, LDH, CRP - Troponin is reassuringly negative.  - Avoid NSAIDs, will keep negative balance as below - Monitor for decompensation at which time off-labed use of HCQ would be considered.  Wheezing, history of asthma: Has never been hospitalized for asthma. PFTs reviewed showing restrictive lung disease with bronchodilator response.   - Avoid nebulized treatments if possible. Substitute with xopenex MDI TID. - Will not start steroids at this time as wheezing could be cardiogenic.  AKI vs. stage III CKD: Cr slightly improved from admission. Historical values indicate an abnormal baseline but has been as low as 0.9. - Avoid nephrotoxins - Give lasix and monitor.   T2DM:  - SSI, anticipate hyperglycemia in setting of infection - Hold metformin for now.  HLD:  - Continue statin  Morbid obesity: BMI 42. Poor prognostic finding. - Weight loss recommended.   DVT prophylaxis: Eliquis Code Status: Full Family Communication: Husband updated in person (also admitted on this floor for covid) Disposition Plan: Uncertain. Continue PCU.   Consultants:   Cardiology, Dr. Tenny Craw by phone 4/19.   Procedures:   None  Antimicrobials:  None   Subjective: Feels no better than yesterday as  far as breathing, but chest tightness in the right upper chest poorly defined is improved. Both worse when laying back, got to [facility] (74ft) and had severe dyspnea, weakness. Still wheezing.  Objective: Vitals:   06/17/18  0900 06/17/18 1000 06/17/18 1025 06/17/18 1116  BP:    94/64  Pulse: (!) 33 (!) 54  (!) 130  Resp: 19 16    Temp:      TempSrc:      SpO2: 95% 96%    Weight:   105.8 kg   Height:        Intake/Output Summary (Last 24 hours) at 06/17/2018 1135 Last data filed at 06/17/2018 0600 Gross per 24 hour  Intake 391.82 ml  Output 350 ml  Net 41.82 ml   Filed Weights   06/16/18 2200 06/17/18 1025  Weight: 103 kg 105.8 kg    Gen: 68 y.o. female in no distress Pulm: Tachypneic with crackles at bases, upper and lower expiratory wheezing noted.  CV: Irregular tachycardia between 105 - 130bpm while laying at rest. No murmur, rub, or gallop. No JVD, 1+ pitting pedal edema. GI: Abdomen soft, non-tender, non-distended, with normoactive bowel sounds. No organomegaly or masses felt. Ext: Warm, no deformities Skin: No rashes, lesions or ulcers Neuro: Alert and oriented. No focal neurological deficits. Psych: Judgement and insight appear fair, cognitive status below normal. Mood & affect appropriate.   Data Reviewed: I have personally reviewed following labs and imaging studies  CBC: Recent Labs  Lab 06/15/18 2036 06/16/18 0548 06/17/18 0615  WBC 7.3 6.8 6.6  HGB 13.5 13.5 12.3  HCT 41.8 43.4 39.7  MCV 93.3 96.9 95.9  PLT 313 142* 290   Basic Metabolic Panel: Recent Labs  Lab 06/15/18 2036 06/16/18 0548 06/17/18 0615  NA 134* 136 134*  K 4.8 4.3 4.1  CL 99 104 99  CO2 21* 21* 24  GLUCOSE 266* 235* 241*  BUN 35* 28* 36*  CREATININE 1.39* 1.23* 1.51*  CALCIUM 8.6* 8.3* 8.3*  MG 1.9  --   --    GFR: Estimated Creatinine Clearance: 41.9 mL/min (A) (by C-G formula based on SCr of 1.51 mg/dL (H)). Liver Function Tests: No results for input(s): AST, ALT, ALKPHOS, BILITOT, PROT, ALBUMIN in the last 168 hours. No results for input(s): LIPASE, AMYLASE in the last 168 hours. No results for input(s): AMMONIA in the last 168 hours. Coagulation Profile: No results for input(s): INR,  PROTIME in the last 168 hours. Cardiac Enzymes: Recent Labs  Lab 06/15/18 2036 06/16/18 0912 06/16/18 1438  TROPONINI <0.03 <0.03 <0.03   BNP (last 3 results) No results for input(s): PROBNP in the last 8760 hours. HbA1C: No results for input(s): HGBA1C in the last 72 hours. CBG: Recent Labs  Lab 06/16/18 0726 06/16/18 1147 06/16/18 1625 06/16/18 2335 06/17/18 0820  GLUCAP 225* 282* 294* 161* 262*   Lipid Profile: No results for input(s): CHOL, HDL, LDLCALC, TRIG, CHOLHDL, LDLDIRECT in the last 72 hours. Thyroid Function Tests: Recent Labs    06/15/18 2036  TSH 1.181   Anemia Panel: No results for input(s): VITAMINB12, FOLATE, FERRITIN, TIBC, IRON, RETICCTPCT in the last 72 hours. Urine analysis:    Component Value Date/Time   COLORURINE YELLOW (A) 06/16/2018 0900   APPEARANCEUR HAZY (A) 06/16/2018 0900   APPEARANCEUR Cloudy 10/02/2013 0058   LABSPEC 1.013 06/16/2018 0900   LABSPEC 1.029 10/02/2013 0058   PHURINE 7.0 06/16/2018 0900   GLUCOSEU NEGATIVE 06/16/2018 0900   GLUCOSEU Negative 10/02/2013 0058  HGBUR NEGATIVE 06/16/2018 0900   BILIRUBINUR NEGATIVE 06/16/2018 0900   BILIRUBINUR Negative 10/02/2013 0058   KETONESUR 5 (A) 06/16/2018 0900   PROTEINUR 30 (A) 06/16/2018 0900   NITRITE NEGATIVE 06/16/2018 0900   LEUKOCYTESUR SMALL (A) 06/16/2018 0900   LEUKOCYTESUR 2+ 10/02/2013 0058   Recent Results (from the past 240 hour(s))  SARS Coronavirus 2 [facility](Hospital order, Performed in [facility])     Status: Abnormal   Collection Time: 06/16/18  1:34 AM  Result Value Ref Range Status   SARS Coronavirus 2 POSITIVE (A) NEGATIVE Final    Comment: RESULT CALLED TO, READ BACK BY AND VERIFIED WITH: KAT MURRAY RN 06/16/2018 @ 0245 RDW (NOTE) If result is NEGATIVE SARS-CoV-2 target nucleic acids are NOT DETECTED. The SARS-CoV-2 RNA is generally detectable in upper and lower  respiratory specimens during the acute phase of infection. The lowest    concentration of SARS-CoV-2 viral copies this assay can detect is 250  copies / mL. A negative result does not preclude SARS-CoV-2 infection  and should not be used as the sole basis for treatment or other  patient management decisions.  A negative result may occur with  improper specimen collection / handling, submission of specimen other  than nasopharyngeal swab, presence of viral mutation(s) within the  areas targeted by this assay, and inadequate number of viral copies  (<250 copies / mL). A negative result must be combined with clinical  observations, patient history, and epidemiological information. If result is POSITIVE SARS-CoV-2 target nucleic acids are DETECTED. T he SARS-CoV-2 RNA is generally detectable in upper and lower  respiratory specimens during the acute phase of infection.  Positive  results are indicative of active infection with SARS-CoV-2.  Clinical  correlation with patient history and other diagnostic information is  necessary to determine patient infection status.  Positive results do  not rule out bacterial infection or co-infection with other viruses. If result is PRESUMPTIVE POSTIVE SARS-CoV-2 nucleic acids MAY BE PRESENT.   A presumptive positive result was obtained on the submitted specimen  and confirmed on repeat testing.  While 2019 novel coronavirus  (SARS-CoV-2) nucleic acids may be present in the submitted sample  additional confirmatory testing may be necessary for epidemiological  and / or clinical management purposes  to differentiate between  SARS-CoV-2 and other Sarbecovirus currently known to infect humans.  If clinically indicated additional testing with an alternate test  methodology [id number]) is  advised. The SARS-CoV-2 RNA is generally  detectable in upper and lower respiratory specimens during the acute  phase of infection. The expected result is Negative. Fact Sheet for Patients:  BoilerBrush.com.cyhttps://www.fda.gov/media/136312/download Fact Sheet  for Healthcare Providers: https://pope.com/https://www.fda.gov/media/136313/download This test is not yet approved or cleared by the [place] FDA and has been authorized for detection and/or diagnosis of SARS-CoV-2 by FDA under an Emergency Use Authorization (EUA).  This EUA will remain in effect (meaning this test can be used) for the duration of the COVID-19 declaration under Section 564(b)(1) of the Act, 21 U.S.C. section 360bbb-3(b)(1), unless the authorization is terminated or revoked sooner. Performed at [facility], [address] [place], [place] [address]       Radiology Studies: [facility]  Result Date: 06/15/2018 CLINICAL DATA:  Nausea. EXAM: PORTABLE CHEST 1 VIEW COMPARISON:  03/08/2018 FINDINGS: 2038 hours. Patient rotated to the right. The cardio pericardial silhouette is enlarged. There is pulmonary vascular congestion without overt pulmonary edema. Probable interstitial pulmonary edema. No substantial pleural effusion.  The visualized bony structures of the thorax are intact. Telemetry leads overlie the chest. IMPRESSION: Cardiomegaly with vascular congestion and probable interstitial edema. Electronically Signed   By: Kennith Center M.D.   On: 06/15/2018 21:28    Scheduled Meds:  apixaban  5 mg Oral BID   atorvastatin  40 mg Oral QHS   diltiazem  60 mg Oral Q8H   docusate sodium  100 mg Oral BID   insulin aspart  0-5 Units Subcutaneous QHS   insulin aspart  0-9 Units Subcutaneous TID WC   levalbuterol  2 puff Inhalation TID   metoprolol tartrate  100 mg Oral BID   sodium chloride flush  3 mL Intravenous Q12H   Continuous Infusions:  sodium chloride     amiodarone     amiodarone     diltiazem (CARDIZEM) infusion 5 mg/hr (06/17/18 0600)     LOS: 1 day   Time spent: 35 minutes.  Tyrone Nine, MD [facility] [email or web address] Password [facility] 06/17/2018, 11:35 AM

## 2018-06-17 NOTE — Plan of Care (Signed)

## 2018-06-18 LAB — COMPREHENSIVE METABOLIC PANEL
ALT: 22 U/L (ref 0–44)
AST: 18 U/L (ref 15–41)
Albumin: 3.1 g/dL — ABNORMAL LOW (ref 3.5–5.0)
Alkaline Phosphatase: 96 U/L (ref 38–126)
Anion gap: 9 (ref 5–15)
BUN: 34 mg/dL — ABNORMAL HIGH (ref 8–23)
CO2: 24 mmol/L (ref 22–32)
Calcium: 8.3 mg/dL — ABNORMAL LOW (ref 8.9–10.3)
Chloride: 101 mmol/L (ref 98–111)
Creatinine, Ser: 1.42 mg/dL — ABNORMAL HIGH (ref 0.44–1.00)
GFR calc Af Amer: 44 mL/min — ABNORMAL LOW (ref >60)
GFR calc non Af Amer: 38 mL/min — ABNORMAL LOW (ref >60)
Glucose, Bld: 311 mg/dL — ABNORMAL HIGH (ref 70–99)
Potassium: 4.4 mmol/L (ref 3.5–5.1)
Sodium: 134 mmol/L — ABNORMAL LOW (ref 135–145)
Total Bilirubin: 0.4 mg/dL (ref 0.3–1.2)
Total Protein: 6.6 g/dL (ref 6.5–8.1)

## 2018-06-18 LAB — CBC WITH DIFFERENTIAL/PLATELET
Abs Immature Granulocytes: 0.09 10*3/uL — ABNORMAL HIGH (ref 0.00–0.07)
Basophils Absolute: 0 10*3/uL (ref 0.0–0.1)
Basophils Relative: 0 %
Eosinophils Absolute: 0 10*3/uL (ref 0.0–0.5)
Eosinophils Relative: 1 %
HCT: 40.6 % (ref 36.0–46.0)
Hemoglobin: 13 g/dL (ref 12.0–15.0)
Immature Granulocytes: 1 %
Lymphocytes Relative: 22 %
Lymphs Abs: 1.6 10*3/uL (ref 0.7–4.0)
MCH: 30.4 pg (ref 26.0–34.0)
MCHC: 32 g/dL (ref 30.0–36.0)
MCV: 94.9 fL (ref 80.0–100.0)
Monocytes Absolute: 0.4 10*3/uL (ref 0.1–1.0)
Monocytes Relative: 5 %
Neutro Abs: 5 10*3/uL (ref 1.7–7.7)
Neutrophils Relative %: 71 %
Platelets: 323 10*3/uL (ref 150–400)
RBC: 4.28 MIL/uL (ref 3.87–5.11)
RDW: 13.1 % (ref 11.5–15.5)
WBC: 7.1 10*3/uL (ref 4.0–10.5)
nRBC: 0 % (ref 0.0–0.2)

## 2018-06-18 LAB — GLUCOSE, CAPILLARY
Glucose-Capillary: 305 mg/dL — ABNORMAL HIGH (ref 70–99)
Glucose-Capillary: 208 mg/dL — ABNORMAL HIGH (ref 70–99)
Glucose-Capillary: 207 mg/dL — ABNORMAL HIGH (ref 70–99)
Glucose-Capillary: 227 mg/dL — ABNORMAL HIGH (ref 70–99)

## 2018-06-18 LAB — FIBRINOGEN: Fibrinogen: 566 mg/dL — ABNORMAL HIGH (ref 210–475)

## 2018-06-18 LAB — C-REACTIVE PROTEIN: CRP: 5.6 mg/dL — ABNORMAL HIGH (ref <1.0)

## 2018-06-18 LAB — D-DIMER, QUANTITATIVE: D-Dimer, Quant: 1.14 ug/mL-FEU — ABNORMAL HIGH (ref 0.00–0.50)

## 2018-06-18 LAB — FERRITIN: Ferritin: 138 ng/mL (ref 11–307)

## 2018-06-18 LAB — LACTATE DEHYDROGENASE: LDH: 168 U/L (ref 98–192)

## 2018-06-18 LAB — HIV ANTIBODY (ROUTINE TESTING W REFLEX): HIV Screen 4th Generation wRfx: NONREACTIVE

## 2018-06-18 MED ORDER — INSULIN GLARGINE 100 UNIT/ML ~~LOC~~ SOLN
10.0000 [IU] | Freq: Every day | SUBCUTANEOUS | Status: DC
  Administered 2018-06-18: 10 [IU] via SUBCUTANEOUS
  Filled 2018-06-18 (×2): qty 0.1

## 2018-06-18 MED ORDER — BRIMONIDINE TARTRATE 0.2 % OP SOLN
1.0000 [drp] | Freq: Three times a day (TID) | OPHTHALMIC | Status: DC
  Administered 2018-06-18 – 2018-06-22 (×13): 1 [drp] via OPHTHALMIC
  Filled 2018-06-18: qty 5

## 2018-06-18 MED ORDER — ALUM & MAG HYDROXIDE-SIMETH 200-200-20 MG/5ML PO SUSP
30.0000 mL | Freq: Four times a day (QID) | ORAL | Status: DC | PRN
  Administered 2018-06-18 (×2): 30 mL via ORAL
  Filled 2018-06-18 (×3): qty 30

## 2018-06-18 MED ORDER — BRINZOLAMIDE 1 % OP SUSP
1.0000 [drp] | Freq: Three times a day (TID) | OPHTHALMIC | Status: DC
  Administered 2018-06-18 – 2018-06-22 (×13): 1 [drp] via OPHTHALMIC
  Filled 2018-06-18: qty 10

## 2018-06-18 MED ORDER — INSULIN ASPART 100 UNIT/ML ~~LOC~~ SOLN
3.0000 [IU] | Freq: Three times a day (TID) | SUBCUTANEOUS | Status: DC
  Administered 2018-06-18 (×3): 3 [IU] via SUBCUTANEOUS

## 2018-06-18 MED ORDER — DILTIAZEM HCL 30 MG PO TABS
90.0000 mg | ORAL_TABLET | Freq: Three times a day (TID) | ORAL | Status: DC
  Administered 2018-06-18 – 2018-06-19 (×3): 90 mg via ORAL
  Filled 2018-06-18: qty 3
  Filled 2018-06-18: qty 1
  Filled 2018-06-18 (×2): qty 3

## 2018-06-18 NOTE — Progress Notes (Signed)
PROGRESS NOTE  Wendy Summers  IAX:[id number] DOB: [birth year] DOA: 06/16/2018 PCP: Inc, [facility]   Brief Narrative: Wendy Summers is a 68 y.o. female with a history of HTN, HLD, T2DM, BD-responsive restrictive lung disease, and PAF on eliquis who presented to [facility] ED 4/17 with nausea, vague chest pain, and feeling ill found to have AFib with rapid ventricular response. She began feeling ill about 2 weeks prior with intermittent fevers and some nonproductive cough. Her husband is currently admitted at [facility] for covid-19 infection. When her symptoms worsened and included chest pain and wheezing she sought care, was given diltiazem gtt for rate control and confirmed to be covid positive, transferred to [facility] 4/18. CXR demonstrated vascular congestion and BNP was elevated. She was given lasix and an inhaler for wheezing. She continues to have shortness of breath with any exertion and having high heart rate with rate control complicated by hypotension.  Assessment & Plan: Principal Problem:   Acute respiratory failure with hypoxia (HCC) Active Problems:   Asthmatic bronchitis   Atrial fibrillation with RVR (HCC)   Asthma   2019 novel coronavirus detected   T2DM (type 2 diabetes mellitus) (HCC)   HLD (hyperlipidemia)   HTN (hypertension)   Acute on chronic diastolic CHF (congestive heart failure) (HCC)   Obesity, Class III, BMI 40-49.9 (morbid obesity) (HCC)  Acute hypoxic respiratory failure due to covid-19 infection and AFib with RVR causing acute on chronic diastolic CHF.  - Continue supplemental oxygen to maintain SpO2 >90%. Will keep under close monitoring.   PAF with RVR:  - Transitioned to po diltiazem, limited by hypotension. Will increase 60mg  q8h home dose to 90mg  q8h as tolerated.  - Continue metoprolol 100mg  po BID home medications - Continue amiodarone gtt, will consider digoxin if not improved with or unable to use dilt po increased dose.   - Continue eliquis - TSH  1.181. Maintain K and Mg levels.   Acute on chronic diastolic CHF: Suspect tachycardia-mediated worsening. Has recent myoview showing preserved EF, low risk.  - Will avoid exposure that would be required to repeat echo pending clinical trajectory.  - Crackles improved, still some swelling. 1750 out over 24hrs. Wt down 237lbs > 233lbs.  Cr improved, will give single dose lasix once more today and monitor I/O, weights, BMP.  - Anticipate improved volume status once HR better controlled.  Covid-19 infection:  - Continue airborne, contact precautions. PPE including gown, gloves, face shield, cap, shoe covers, N-95 used during this encounter.  - Check daily labs: CBC w/diff, CMP, d-dimer, fibrinogen, ferritin, LDH, CRP. Overall stabilizing. - Troponin is reassuringly negative.  - Avoid NSAIDs, will keep negative balance as above - Monitor for decompensation at which time off-labed use of HCQ would be considered.  Wheezing, history of asthma: Has never been hospitalized for asthma. PFTs reviewed showing restrictive lung disease with bronchodilator response.   - Avoid nebulized treatments if possible. Substitute with xopenex MDI TID. - Will not start steroids at this time as wheezing could be cardiogenic.  AKI vs. stage III CKD: Cr slightly improved from admission. Historical values indicate an abnormal baseline but has been as low as 0.9. - Avoid nephrotoxins - Continue monitoring.   T2DM uncontrolled with severe infection-induced hyperglycemia:  - Add mealtime insulin 3u, continue mod SSI and HS coverage in this patient not on insulin at home.  - Add lantus this AM, AM CBG >300mg /dl.  - Hold metformin for now.  HLD:  - Continue statin  Morbid obesity: BMI 42. Poor prognostic finding. - Weight loss recommended.   Glaucoma: No symptoms at present - Reorder formulary equivalent of TID gtt's  DVT prophylaxis: Eliquis Code Status: Full Family Communication: Husband updated in person (also  admitted on this floor for covid-related illness) Disposition Plan: Uncertain, needs improved rate control. Continue PCU.   Consultants:   Cardiology, Dr. Tenny Craw by phone 4/19.   Procedures:   None  Antimicrobials:  None   Subjective: Breathing a little better, chest tightness has remained resolved. Had severe reflux symptoms, burning in lower chest/upper abdomen, nausea, gagging after dinner last night which was resolved with maalox. No fevers. Feels palpitations frequently. Had BM, no syncope when getting to [facility].   Objective: Vitals:   06/18/18 0434 06/18/18 0550 06/18/18 0600 06/18/18 0715  BP: 103/67 112/74 (!) 113/96 127/77  Pulse: 100  88 (!) 47  Resp:   18 17  Temp: 97.6 F (36.4 C)   97.9 F (36.6 C)  TempSrc: Oral   Oral  SpO2:   95% 96%  Weight: 106.1 kg     Height:        Intake/Output Summary (Last 24 hours) at 06/18/2018 0817 Last data filed at 06/18/2018 0600 Gross per 24 hour  Intake 1381.63 ml  Output 1750 ml  Net -368.37 ml   Filed Weights   06/16/18 2200 06/17/18 1025 06/18/18 0434  Weight: 103 kg 105.8 kg 106.1 kg   Gen: Pleasant, obese female in no distress Pulm: Nonlabored, rate 15/min. Crackles have resolved, expiratory wheezing is stable. CV: Rapid irregular 107-125 during encounter. No murmur, rub, or gallop. No JVD, trace nonpitting LE dependent edema. GI: Abdomen soft, non-tender, non-distended, with normoactive bowel sounds.  Ext: Warm, no deformities Skin: Facial hirsutism stable, no new rashes, lesions or ulcers on visualized skin. Neuro: Alert and oriented. No focal neurological deficits. Psych: Judgement and insight appear fair, low-normal cognition. Mood euthymic & affect congruent. Behavior is appropriate.    CBC: Recent Labs  Lab 06/15/18 2036 06/16/18 0548 06/17/18 0615  WBC 7.3 6.8 6.6  HGB 13.5 13.5 12.3  HCT 41.8 43.4 39.7  MCV 93.3 96.9 95.9  PLT 313 142* 290   Basic Metabolic Panel: Recent Labs  Lab 06/15/18 2036  06/16/18 0548 06/17/18 0615 06/18/18 0500  NA 134* 136 134* 134*  K 4.8 4.3 4.1 4.4  CL 99 104 99 101  CO2 21* 21* 24 24  GLUCOSE 266* 235* 241* 311*  BUN 35* 28* 36* 34*  CREATININE 1.39* 1.23* 1.51* 1.42*  CALCIUM 8.6* 8.3* 8.3* 8.3*  MG 1.9  --   --   --     Cardiac Enzymes: Recent Labs  Lab 06/15/18 2036 06/16/18 0912 06/16/18 1438  TROPONINI <0.03 <0.03 <0.03   HbA1C: Recent Labs    06/17/18 0615  HGBA1C 7.5*   CBG: Recent Labs  Lab 06/17/18 0820 06/17/18 1143 06/17/18 1657 06/17/18 2109 06/18/18 0726  GLUCAP 262* 231* 247* 232* 305*   Thyroid Function Tests: Recent Labs    06/15/18 2036  TSH 1.181   Anemia Panel: Recent Labs    06/18/18 0500  FERRITIN 138   Covid: Positive  Radiology Studies: No results found.  Scheduled Meds: . apixaban  5 mg Oral BID  . atorvastatin  40 mg Oral QHS  . diltiazem  90 mg Oral Q8H  . docusate sodium  100 mg Oral BID  . furosemide  40 mg Intravenous Daily  . insulin aspart  0-15 Units Subcutaneous TID  WC  . insulin aspart  0-5 Units Subcutaneous QHS  . insulin aspart  3 Units Subcutaneous TID WC  . insulin glargine  10 Units Subcutaneous Daily  . levalbuterol  2 puff Inhalation TID  . metoprolol tartrate  100 mg Oral BID  . sodium chloride flush  3 mL Intravenous Q12H   Continuous Infusions: . sodium chloride 10 mL/hr at 06/18/18 0600  . amiodarone 30 mg/hr (06/18/18 0354)     LOS: 2 days   Time spent: 35 minutes.  Tyrone Nineyan B Demetrius Barrell, MD [facility] [email or web address] Password [email or web address] 06/18/2018, 8:17 AM

## 2018-06-18 NOTE — Progress Notes (Signed)
Patient transferred to chair to visit with husband in his room down hall to eat lunch (visit approximately 30 min), update given on care.

## 2018-06-18 NOTE — Evaluation (Signed)
Physical Therapy Evaluation Patient Details Name: Wendy Summers MRN: [id number] DOB: 06-07-1950 Today's Date: 06/18/2018   History of Present Illness  68 y.o. female with a history of HTN, HLD, T2DM, BD-responsive restrictive lung disease, and PAF on eliquis who [facility] ED 4/17 with nausea, vague chest pain, and feeling ill found to have AFib with rapid ventricular response.  confirmed to be covid positive, [facility]; pt husband is also at [facility]               Clinical Impression  The patient  Currently on 2 liters [place]. Patient assisted with 1 [facility] then to recliner. HR max 131. Sats >95%. PATIENT SHOULD PROGRESS TO RETURN TO HOME . Pt admitted with above diagnosis. Pt currently with functional limitations due to the deficits listed below (see PT Problem List). Pt will benefit from skilled PT to increase their independence and safety with mobility to allow discharge to the venue listed below.       Follow Up Recommendations Home health PT    Equipment Recommendations  None recommended by PT    Recommendations for Other Services       Precautions / Restrictions Precautions Precautions: Fall Precaution Comments: monitor VS, on [place] today      Mobility  Bed Mobility Overal bed mobility: Needs Assistance Bed Mobility: Supine to Sit     Supine to sit: Min assist     General bed mobility comments: slight assist with trunk  Transfers Overall transfer level: Needs assistance   Transfers: Sit to/from Stand;Stand Pivot Transfers Sit to Stand: Min assist Stand pivot transfers: Min assist       General transfer comment: steady assist to rise, [facility] then steps to recliner. HR 131. On 2 Liters [place]  Ambulation/Gait             General Gait Details: tba  Stairs            Wheelchair Mobility    Modified Rankin (Stroke Patients Only)       Balance Overall balance assessment: Needs assistance Sitting-balance support: Feet  supported;No upper extremity supported Sitting balance-Leahy Scale: Good     Standing balance support: During functional activity;Single extremity supported Standing balance-Leahy Scale: Fair                               Pertinent Vitals/Pain Pain Assessment: Faces Faces Pain Scale: Hurts little more Pain Location: right wrist IV site Pain Intervention(s): Monitored during session    Home Living Family/patient expects to be discharged to:: Private residence Living Arrangements: Spouse/significant other Available Help at Discharge: Family Type of Home: House Home Access: Stairs to enter   Secretary/administratorntrance Stairs-Number of Steps: 1 Home Layout: One level Home Equipment: None Additional Comments: spouse in [facility] with Covid-19    Prior Function Level of Independence: Independent         Comments: does not drive     Hand Dominance        Extremity/Trunk Assessment   Upper Extremity Assessment Upper Extremity Assessment: Defer to OT evaluation    Lower Extremity Assessment Lower Extremity Assessment: Generalized weakness    Cervical / Trunk Assessment Cervical / Trunk Assessment: Normal  Communication   Communication: No difficulties  Cognition Arousal/Alertness: Awake/alert Behavior During Therapy: WFL for tasks assessed/performed Overall Cognitive Status: Within Functional Limits for tasks assessed  General Comments      Exercises     Assessment/Plan    PT Assessment Patient needs continued PT services  PT Problem List Decreased strength;Decreased activity tolerance;Decreased mobility;Decreased knowledge of precautions;Decreased safety awareness;Decreased knowledge of use of DME;Cardiopulmonary status limiting activity       PT Treatment Interventions DME instruction;Gait training;Functional mobility training;Therapeutic activities;Patient/family education    PT Goals (Current goals can be  found in the Care Plan section)  Acute Rehab PT Goals Patient Stated Goal: to go home PT Goal Formulation: With patient Time For Goal Achievement: 07/02/18 Potential to Achieve Goals: Good    Frequency Min 3X/week   Barriers to discharge        Co-evaluation               AM-PAC PT "6 Clicks" Mobility  Outcome Measure Help needed turning from your back to your side while in a flat bed without using bedrails?: A Little Help needed moving from lying on your back to sitting on the side of a flat bed without using bedrails?: A Little Help needed moving to and from a bed to a chair (including a wheelchair)?: A Little Help needed standing up from a chair using your arms (e.g., wheelchair or bedside chair)?: A Little Help needed to walk in hospital room?: A Lot Help needed climbing 3-5 steps with a railing? : A Lot 6 Click Score: 16    End of Session   Activity Tolerance: Patient tolerated treatment well Patient left: in chair;with call bell/phone within reach Nurse Communication: Mobility status PT Visit Diagnosis: Unsteadiness on feet (R26.81)    Time: 1520-1600 PT Time Calculation (min) (ACUTE ONLY): 40 min   Charges:   PT Evaluation $PT Eval Low Complexity: 1 Low PT Treatments $Therapeutic Activity: 23-37 mins        Blanchard Kelch PT Acute Rehabilitation Services Pager [phone] Office [phone]   Rada Hay 06/18/2018, 4:10 PM

## 2018-06-18 NOTE — Progress Notes (Signed)
Pt repositioned in bed. NAD will continue to monitor.

## 2018-06-19 LAB — GLUCOSE, CAPILLARY
Glucose-Capillary: 195 mg/dL — ABNORMAL HIGH (ref 70–99)
Glucose-Capillary: 201 mg/dL — ABNORMAL HIGH (ref 70–99)
Glucose-Capillary: 221 mg/dL — ABNORMAL HIGH (ref 70–99)
Glucose-Capillary: 168 mg/dL — ABNORMAL HIGH (ref 70–99)

## 2018-06-19 LAB — COMPREHENSIVE METABOLIC PANEL
ALT: 18 U/L (ref 0–44)
AST: 15 U/L (ref 15–41)
Albumin: 3 g/dL — ABNORMAL LOW (ref 3.5–5.0)
Alkaline Phosphatase: 89 U/L (ref 38–126)
Anion gap: 9 (ref 5–15)
BUN: 33 mg/dL — ABNORMAL HIGH (ref 8–23)
CO2: 26 mmol/L (ref 22–32)
Calcium: 8.7 mg/dL — ABNORMAL LOW (ref 8.9–10.3)
Chloride: 100 mmol/L (ref 98–111)
Creatinine, Ser: 1.27 mg/dL — ABNORMAL HIGH (ref 0.44–1.00)
GFR calc Af Amer: 51 mL/min — ABNORMAL LOW (ref >60)
GFR calc non Af Amer: 44 mL/min — ABNORMAL LOW (ref >60)
Glucose, Bld: 213 mg/dL — ABNORMAL HIGH (ref 70–99)
Potassium: 4 mmol/L (ref 3.5–5.1)
Sodium: 135 mmol/L (ref 135–145)
Total Bilirubin: 0.3 mg/dL (ref 0.3–1.2)
Total Protein: 6.5 g/dL (ref 6.5–8.1)

## 2018-06-19 LAB — LACTATE DEHYDROGENASE: LDH: 150 U/L (ref 98–192)

## 2018-06-19 LAB — CBC WITH DIFFERENTIAL/PLATELET
Abs Immature Granulocytes: 0.07 10*3/uL (ref 0.00–0.07)
Basophils Absolute: 0 10*3/uL (ref 0.0–0.1)
Basophils Relative: 1 %
Eosinophils Absolute: 0.2 10*3/uL (ref 0.0–0.5)
Eosinophils Relative: 2 %
HCT: 40.1 % (ref 36.0–46.0)
Hemoglobin: 12.5 g/dL (ref 12.0–15.0)
Immature Granulocytes: 1 %
Lymphocytes Relative: 30 %
Lymphs Abs: 2.3 10*3/uL (ref 0.7–4.0)
MCH: 29.4 pg (ref 26.0–34.0)
MCHC: 31.2 g/dL (ref 30.0–36.0)
MCV: 94.4 fL (ref 80.0–100.0)
Monocytes Absolute: 0.6 10*3/uL (ref 0.1–1.0)
Monocytes Relative: 7 %
Neutro Abs: 4.6 10*3/uL (ref 1.7–7.7)
Neutrophils Relative %: 59 %
Platelets: 345 10*3/uL (ref 150–400)
RBC: 4.25 MIL/uL (ref 3.87–5.11)
RDW: 12.9 % (ref 11.5–15.5)
WBC: 7.8 10*3/uL (ref 4.0–10.5)
nRBC: 0 % (ref 0.0–0.2)

## 2018-06-19 LAB — MAGNESIUM: Magnesium: 2.1 mg/dL (ref 1.7–2.4)

## 2018-06-19 LAB — FERRITIN: Ferritin: 113 ng/mL (ref 11–307)

## 2018-06-19 LAB — C-REACTIVE PROTEIN: CRP: 3.1 mg/dL — ABNORMAL HIGH (ref <1.0)

## 2018-06-19 LAB — D-DIMER, QUANTITATIVE: D-Dimer, Quant: 1.4 ug/mL-FEU — ABNORMAL HIGH (ref 0.00–0.50)

## 2018-06-19 MED ORDER — INSULIN ASPART 100 UNIT/ML ~~LOC~~ SOLN
5.0000 [IU] | Freq: Three times a day (TID) | SUBCUTANEOUS | Status: DC
  Administered 2018-06-19 – 2018-06-20 (×4): 5 [IU] via SUBCUTANEOUS

## 2018-06-19 MED ORDER — INSULIN GLARGINE 100 UNIT/ML ~~LOC~~ SOLN
12.0000 [IU] | Freq: Every day | SUBCUTANEOUS | Status: DC
  Administered 2018-06-19 – 2018-06-22 (×4): 12 [IU] via SUBCUTANEOUS
  Filled 2018-06-19 (×4): qty 0.12

## 2018-06-19 MED ORDER — DILTIAZEM HCL 30 MG PO TABS
120.0000 mg | ORAL_TABLET | Freq: Three times a day (TID) | ORAL | Status: DC
  Administered 2018-06-19 – 2018-06-20 (×3): 120 mg via ORAL
  Filled 2018-06-19 (×4): qty 4

## 2018-06-19 MED ORDER — ORAL CARE MOUTH RINSE
15.0000 mL | Freq: Two times a day (BID) | OROMUCOSAL | Status: DC
  Administered 2018-06-19 – 2018-06-21 (×7): 15 mL via OROMUCOSAL

## 2018-06-19 NOTE — TOC Initial Note (Signed)
Transition of Care [facility](TOC) - Initial/Assessment Note    Patient Details  Name: Wendy Summers MRN: [id number] Date of Birth: 1950-09-15  Transition of Care [facility](TOC) CM/SW Contact:    Durenda GuthrieBrady, Jacobus Colvin Naomi, RN Phone Number: 06/19/2018, 1:50 PM  Clinical Narrative: 68 yr old female admitted with Acute respiratory failure, COVID 19 Positive. Patient is slowly improving. She continues to have shortness of breath with any exertion and having high heart rate with rate control complicated by hypotension.  Case manager spoke with patient and her husband, Wendy Summers, who is also hospitalized for COVID-19, concerning Home Health needs at discharge. Choice for Home Health agency offered, referral called to Dara Hoyeriffany Watson, [facility]. No DME needs. Case manager will continue to monitor for possible oxygen needs at discharge.  Expected Discharge Plan: Home w Home Health Services Barriers to Discharge: No Barriers Identified   Patient Goals and CMS Choice     Choice offered to / list presented to : Patient  Expected Discharge Plan and Services Expected Discharge Plan: Home w Home Health Services   Discharge Planning Services: CM Consult Post Acute Care Choice: Home Health Living arrangements for the past 2 months: Single Family Home                 DME Arranged: N/A DME Agency: NA HH Arranged: PT, Nurse's Aide [facility]  Prior Living Arrangements/Services Living arrangements for the past 2 months: Single Family Home Lives with:: Spouse Patient language and need for interpreter reviewed:: No Do you feel safe going back to the place where you live?: Yes      Need for Family Participation in Patient Care: Yes (Comment) Care giver support system in place?: Yes (comment)   Criminal Activity/Legal Involvement Pertinent to Current Situation/Hospitalization: No - Comment as needed  Activities of Daily Living Home Assistive  Devices/Equipment: Cane (specify quad or straight), Dentures (specify type), Eyeglasses, Nebulizer ADL Screening (condition at time of admission) Patient's cognitive ability adequate to safely complete daily activities?: Yes Is the patient deaf or have difficulty hearing?: No Does the patient have difficulty seeing, even when wearing glasses/contacts?: No Does the patient have difficulty concentrating, remembering, or making decisions?: No Patient able to express need for assistance with ADLs?: Yes Does the patient have difficulty dressing or bathing?: No Independently performs ADLs?: Yes (appropriate for developmental age) Does the patient have difficulty walking or climbing stairs?: No Weakness of Legs: None Weakness of Arms/Hands: None  Permission Sought/Granted Permission sought to share information with : Case Manager                Emotional Assessment       Orientation: : Oriented to Self, Oriented to Situation, Oriented to Place, Oriented to  Time Alcohol / Substance Use: Not Applicable    Admission diagnosis:  COVID-19 POSITIVE  Patient Active Problem List   Diagnosis Date Noted  . Acute respiratory failure with hypoxia (HCC) 06/17/2018  . Acute on chronic diastolic CHF (congestive heart failure) (HCC) 06/17/2018  . Obesity, Class III, BMI 40-49.9 (morbid obesity) (HCC) 06/17/2018  . Atrial flutter with rapid ventricular response (HCC) 06/16/2018  . 2019 novel coronavirus detected 06/16/2018  . T2DM (type 2 diabetes mellitus) (HCC) 06/16/2018  . HLD (hyperlipidemia) 06/16/2018  . HTN (hypertension) 06/16/2018  . Coronavirus infection 06/16/2018  . Atrial fibrillation with RVR (HCC) 06/15/2018  . Diabetes (HCC) 06/15/2018  . Asthma 06/15/2018  . Sepsis (HCC) 04/21/2018  .  Asthmatic bronchitis 07/14/2016  . Near syncope 07/13/2016   PCP:  [facility]:   [id number] - [place], [place] - [address]  [place] [address] Phone: [phone] Fax: [phone]     Social Determinants of Health (SDOH) Interventions    Readmission Risk Interventions No flowsheet data found.

## 2018-06-19 NOTE — Progress Notes (Signed)
Physical Therapy Treatment Patient Details Name: Wendy Summers MRN: [id number] DOB: Apr 26, 1950 Today's Date: 06/19/2018    History of Present Illness 68 y.o. female with a history of HTN, HLD, T2DM, BD-responsive restrictive lung disease, and PAF on eliquis who presented to [facility] ED 4/17 with nausea, vague chest pain, and feeling ill found to have AFib with rapid ventricular response.  confirmed to be covid positive, transferred to [facility] 4/18; pt husband is also at [facility]                 PT Comments    The patient's  HR noted to be in range from 110 ant 150's while therapist in romm. RN gave PT clearance to assist patient OOB to recliner. Patient's mobility is  Not at baseline.  Continue PT efforts for progressive mobility.  Follow Up Recommendations  Home health PT     Equipment Recommendations  None recommended by PT    Recommendations for Other Services       Precautions / Restrictions Precautions Precautions: Fall Precaution Comments: monitor VS, HR up to 150's    Mobility  Bed Mobility Overal bed mobility: Needs Assistance Bed Mobility: Supine to Sit     Supine to sit: Min assist     General bed mobility comments: slight assist with trunk  Transfers Overall transfer level: Needs assistance Equipment used: 1 person hand held assist Transfers: Sit to/from Stand   Stand pivot transfers: Min guard       General transfer comment: steady assist to rise,   Ambulation/Gait   Gait Distance (Feet): 5 Feet Assistive device: 1 person hand held assist Gait Pattern/deviations: Step-through pattern     General Gait Details: short  amb to Recliner. HR ranges from 110-150.   Stairs             Wheelchair Mobility    Modified Rankin (Stroke Patients Only)       Balance                                            Cognition Arousal/Alertness: Awake/alert                                            Exercises       General Comments        Pertinent Vitals/Pain Pain Assessment: No/denies pain    Home Living                      Prior Function            PT Goals (current goals can now be found in the care plan section) Progress towards PT goals: Progressing toward goals    Frequency    Min 3X/week      PT Plan Current plan remains appropriate    Co-evaluation              AM-PAC PT "6 Clicks" Mobility   Outcome Measure  Help needed turning from your back to your side while in a flat bed without using bedrails?: A Little Help needed moving from lying on your back to sitting on the side of a flat bed without using bedrails?: A Little Help needed moving to and from a bed to a  chair (including a wheelchair)?: A Little Help needed standing up from a chair using your arms (e.g., wheelchair or bedside chair)?: A Little Help needed to walk in hospital room?: A Lot Help needed climbing 3-5 steps with a railing? : A Lot 6 Click Score: 16    End of Session   Activity Tolerance: Treatment limited secondary to medical complications (Comment) Patient left: in chair;with call bell/phone within reach;with family/visitor present Nurse Communication: Mobility status PT Visit Diagnosis: Unsteadiness on feet (R26.81)     Time: 1410-1430 PT Time Calculation (min) (ACUTE ONLY): 20 min  Charges:  $Gait Training: 8-22 mins                     Blanchard KelchKaren Abdulaziz Toman PT Acute Rehabilitation Services Pager [phone] Office [phone]    Rada HayHill, Lynia Landry Elizabeth 06/19/2018, 2:35 PM

## 2018-06-19 NOTE — Progress Notes (Signed)
PROGRESS NOTE  Wendy Summers  HMC:[id number] DOB: 30-Jun-1950 DOA: 06/16/2018 PCP: [facility], [facility]   Brief Narrative: Wendy Summers is a 68 y.o. female with a history of HTN, HLD, T2DM, BD-responsive restrictive lung disease, and PAF on eliquis who presented to [facility] ED 4/17 with nausea, vague chest pain, and feeling ill found to have AFib with rapid ventricular response. She began feeling ill about 2 weeks prior with intermittent fevers and some nonproductive cough. Her husband is currently admitted at [facility] for covid-19 infection. When her symptoms worsened and included chest pain and wheezing she sought care, was given diltiazem gtt for rate control and confirmed to be covid positive, transferred to [facility] 4/18. CXR demonstrated vascular congestion and BNP was elevated. She was given lasix and an inhaler for wheezing. She continues to have shortness of breath with any exertion and having high heart rate with rate control complicated by hypotension.  Assessment & Plan: Principal Problem:   Acute respiratory failure with hypoxia (HCC) Active Problems:   Asthmatic bronchitis   Atrial fibrillation with RVR (HCC)   Asthma   2019 novel coronavirus detected   T2DM (type 2 diabetes mellitus) (HCC)   HLD (hyperlipidemia)   HTN (hypertension)   Acute on chronic diastolic CHF (congestive heart failure) (HCC)   Obesity, Class III, BMI 40-49.9 (morbid obesity) (HCC)  Acute hypoxic respiratory failure due to covid-19 infection and AFib with RVR causing acute on chronic diastolic CHF.  - Continue supplemental oxygen to maintain SpO2 >90%. Will keep under close monitoring.   PAF with RVR:  - Transitioned to po diltiazem, limited by hypotension. Increased 60mg  q8h home dose to 90mg  q8h as tolerated. BP is improving. Increase slightly. - Continue metoprolol 100mg  po BID home medications - Continue amiodarone gtt, will consider digoxin if not improved with or unable to use dilt po increased dose.    - Continue eliquis - TSH 1.181. Maintain K and Mg levels >4 and >2.  Acute on chronic diastolic CHF: Suspect tachycardia-mediated worsening. Has recent myoview showing preserved EF, low risk.  - Will avoid exposure that would be required to repeat echo pending clinical trajectory.  - Crackles improved, still some swelling. 1750 out over 24hrs. Wt down 237lbs > 233lbs.  Cr improved, will give single dose lasix once more today and monitor I/O, weights, BMP.  - Anticipate improved volume status once HR better controlled.  Covid-19 infection:  - Continue airborne, contact precautions. PPE including gown, gloves, face shield, cap, shoe covers, N-95 used during this encounter.  - Check daily labs: CBC w/diff, CMP, d-dimer, LDH, CRP. CRP, LDH improving, d-dimer up a bit. - Troponin is reassuringly negative.  - Avoid NSAIDs, will keep negative balance as above - Monitor for decompensation at which time off-labed use of HCQ would be considered, though QTc would need close monitoring, currently ~447msec during this encounter.  Wheezing, history of asthma: Has never been hospitalized for asthma. PFTs reviewed showing restrictive lung disease with bronchodilator response.   - Avoid nebulized treatments if possible. Substitute with xopenex MDI TID. - Will not start steroids at this time as wheezing could be cardiogenic, upper airway, and is improving.  AKI vs. stage III CKD: Cr improving from admission. Historical values indicate an abnormal baseline but has been as low as 0.9. - Avoid nephrotoxins - Continue monitoring.   T2DM uncontrolled with severe infection-induced hyperglycemia:  - This patient not on insulin at home. All CBGs>200, increase lantus 10 > 12; mealtime 3>5, cont  SSI, HS. - Hold metformin for now.  HLD:  - Continue statin  Morbid obesity: BMI 42. Poor prognostic finding. - Weight loss recommended.   Glaucoma: No symptoms at present - Reordered formulary equivalent of TID  gtt's  DVT prophylaxis: Eliquis Code Status: Full Family Communication: Husband updated in person Disposition Plan: Uncertain, needs improved rate control. Continue PCU, plan to transfer to husband's room. Possible DC home pending clinical course in next 48 hours.  Consultants:   Cardiology, Dr. Tenny Craw by phone 4/19.   Procedures:   None  Antimicrobials:  None   Subjective: Feels better than yesterday, breathing better, less palpitations. No chest pain. Still getting sick to her stomach intermittently improved with maalox. Her younger sister died of cancer yesterday, though she is coping well with her husband's help here.  Objective: Vitals:   06/19/18 0600 06/19/18 0700 06/19/18 0800 06/19/18 0929  BP: 109/75 (!) 89/64 112/82 114/87  Pulse: 91 (!) 52 (!) 43 (!) 113  Resp: Temp:    98.4 F (36.9 C)  TempSrc:    Oral  SpO2: 96% 96% 97% 92%  Weight:      Height:        Intake/Output Summary (Last 24 hours) at 06/19/2018 1025 Last data filed at 06/19/2018 7829 Gross per 24 hour  Intake 1479.27 ml  Output 1575 ml  Net -95.73 ml   Filed Weights   06/17/18 1025 06/18/18 0434 06/19/18 0443  Weight: 105.8 kg 106.1 kg 104.5 kg   Gen: Pleasant obese female in no distress Pulm: Nonlabored breathing supplemental oxygen. No crackles, no lower wheezing, transmitted . CV: Irreg irreg, rate in low 100's. No new murmur, rub, or gallop. No JVD, trace dependent edema. GI: Abdomen soft, non-tender, non-distended, with normoactive bowel sounds.  Ext: Warm, no deformities Skin: No rashes, lesions or ulcers on visualized skin. Neuro: Alert and oriented. No focal neurological deficits. Psych: Judgement and insight appear fair. Mood euthymic & affect congruent.   CBC: Recent Labs  Lab 06/15/18 2036 06/16/18 0548 06/17/18 0615 06/18/18 0500 06/19/18 0450  WBC 7.3 6.8 6.6 7.1 7.8  NEUTROABS  --   --   --  5.0 4.6  HGB 13.5 13.5 12.3 13.0 12.5  HCT 41.8 43.4 39.7 40.6  40.1  MCV 93.3 96.9 95.9 94.9 94.4  PLT 313 142* 290 323 345   Basic Metabolic Panel: Recent Labs  Lab 06/15/18 2036 06/16/18 0548 06/17/18 0615 06/18/18 0500 06/19/18 0450  NA 134* 136 134* 134* 135  K 4.8 4.3 4.1 4.4 4.0  CL 99 104 99 101 100  CO2 21* 21* GLUCOSE 266* 235* 241* 311* 213*  BUN 35* 28* 36* 34* 33*  CREATININE 1.39* 1.23* 1.51* 1.42* 1.27*  CALCIUM 8.6* 8.3* 8.3* 8.3* 8.7*  MG 1.9  --   --   --  2.1    Cardiac Enzymes: Recent Labs  Lab 06/15/18 2036 06/16/18 0912 06/16/18 1438  TROPONINI <0.03 <0.03 <0.03   HbA1C: Recent Labs    06/17/18 0615  HGBA1C 7.5*   CBG: Recent Labs  Lab 06/17/18 2109 06/18/18 0726 06/18/18 1136 06/18/18 1650 06/18/18 2047  GLUCAP 232* 305* 208* 207* 227*   Thyroid Function Tests: No results for input(s): TSH, T4TOTAL, FREET4, T3FREE, THYROIDAB in the last 72 hours. Anemia Panel: Recent Labs    06/18/18 0500 06/19/18 0450  FERRITIN 138 113   Covid: Positive  Radiology Studies: No results found.  Scheduled Meds:  apixaban  5 mg Oral BID   atorvastatin  40 mg Oral QHS   brimonidine  1 drop Both Eyes TID   brinzolamide  1 drop Both Eyes TID   diltiazem  90 mg Oral Q8H   docusate sodium  100 mg Oral BID   furosemide  40 mg Intravenous Daily   insulin aspart  0-15 Units Subcutaneous TID WC   insulin aspart  0-5 Units Subcutaneous QHS   insulin aspart  5 Units Subcutaneous TID WC   insulin glargine  12 Units Subcutaneous Daily   levalbuterol  2 puff Inhalation TID   mouth rinse  15 mL Mouth Rinse BID   metoprolol tartrate  100 mg Oral BID   sodium chloride flush  3 mL Intravenous Q12H   Continuous Infusions:  sodium chloride 250 mL (06/19/18 0433)   amiodarone 30 mg/hr (06/19/18 0437)     LOS: 3 days   Time spent: 35 minutes.  Tyrone Nineyan B Saramarie Stinger, MD [facility] [email or web address] Password [email or web address] 06/19/2018, 10:25 AM

## 2018-06-20 LAB — CBC WITH DIFFERENTIAL/PLATELET
Abs Immature Granulocytes: 0.08 10*3/uL — ABNORMAL HIGH (ref 0.00–0.07)
Basophils Absolute: 0 10*3/uL (ref 0.0–0.1)
Basophils Relative: 0 %
Eosinophils Absolute: 0.3 10*3/uL (ref 0.0–0.5)
Eosinophils Relative: 3 %
HCT: 40.3 % (ref 36.0–46.0)
Hemoglobin: 12.9 g/dL (ref 12.0–15.0)
Immature Granulocytes: 1 %
Lymphocytes Relative: 21 %
Lymphs Abs: 1.9 10*3/uL (ref 0.7–4.0)
MCH: 30.1 pg (ref 26.0–34.0)
MCHC: 32 g/dL (ref 30.0–36.0)
MCV: 94.2 fL (ref 80.0–100.0)
Monocytes Absolute: 0.7 10*3/uL (ref 0.1–1.0)
Monocytes Relative: 7 %
Neutro Abs: 6.1 10*3/uL (ref 1.7–7.7)
Neutrophils Relative %: 68 %
Platelets: 370 10*3/uL (ref 150–400)
RBC: 4.28 MIL/uL (ref 3.87–5.11)
RDW: 12.9 % (ref 11.5–15.5)
WBC: 9 10*3/uL (ref 4.0–10.5)
nRBC: 0 % (ref 0.0–0.2)

## 2018-06-20 LAB — COMPREHENSIVE METABOLIC PANEL
ALT: 20 U/L (ref 0–44)
AST: 21 U/L (ref 15–41)
Albumin: 3.4 g/dL — ABNORMAL LOW (ref 3.5–5.0)
Alkaline Phosphatase: 100 U/L (ref 38–126)
Anion gap: 10 (ref 5–15)
BUN: 26 mg/dL — ABNORMAL HIGH (ref 8–23)
CO2: 26 mmol/L (ref 22–32)
Calcium: 9.2 mg/dL (ref 8.9–10.3)
Chloride: 100 mmol/L (ref 98–111)
Creatinine, Ser: 1.14 mg/dL — ABNORMAL HIGH (ref 0.44–1.00)
GFR calc Af Amer: 58 mL/min — ABNORMAL LOW (ref >60)
GFR calc non Af Amer: 50 mL/min — ABNORMAL LOW (ref >60)
Glucose, Bld: 191 mg/dL — ABNORMAL HIGH (ref 70–99)
Potassium: 3.9 mmol/L (ref 3.5–5.1)
Sodium: 136 mmol/L (ref 135–145)
Total Bilirubin: 0.4 mg/dL (ref 0.3–1.2)
Total Protein: 6.8 g/dL (ref 6.5–8.1)

## 2018-06-20 LAB — LACTATE DEHYDROGENASE: LDH: 168 U/L (ref 98–192)

## 2018-06-20 LAB — GLUCOSE, CAPILLARY
Glucose-Capillary: 161 mg/dL — ABNORMAL HIGH (ref 70–99)
Glucose-Capillary: 184 mg/dL — ABNORMAL HIGH (ref 70–99)
Glucose-Capillary: 167 mg/dL — ABNORMAL HIGH (ref 70–99)
Glucose-Capillary: 173 mg/dL — ABNORMAL HIGH (ref 70–99)

## 2018-06-20 LAB — C-REACTIVE PROTEIN: CRP: 2.2 mg/dL — ABNORMAL HIGH (ref <1.0)

## 2018-06-20 LAB — D-DIMER, QUANTITATIVE: D-Dimer, Quant: 1.15 ug/mL-FEU — ABNORMAL HIGH (ref 0.00–0.50)

## 2018-06-20 LAB — MAGNESIUM: Magnesium: 2.2 mg/dL (ref 1.7–2.4)

## 2018-06-20 MED ORDER — INSULIN ASPART 100 UNIT/ML ~~LOC~~ SOLN
8.0000 [IU] | Freq: Three times a day (TID) | SUBCUTANEOUS | Status: DC
  Administered 2018-06-20 – 2018-06-22 (×6): 8 [IU] via SUBCUTANEOUS

## 2018-06-20 MED ORDER — DILTIAZEM HCL 30 MG PO TABS
90.0000 mg | ORAL_TABLET | Freq: Three times a day (TID) | ORAL | Status: DC
  Administered 2018-06-20 – 2018-06-22 (×7): 90 mg via ORAL
  Filled 2018-06-20 (×10): qty 3

## 2018-06-20 NOTE — Progress Notes (Signed)
Pt HR decreased to 55 converting to NSR from A-Fib. Amiodarone on hold. MD aware.

## 2018-06-20 NOTE — Evaluation (Addendum)
Occupational Therapy Evaluation Patient Details Name: Wendy Summers MRN: [id number] DOB: November 01, 1950 Today's Date: 06/20/2018    History of Present Illness 68 y.o. female with a history of HTN, HLD, T2DM, BD-responsive restrictive lung disease, and PAF on eliquis who presented [facility] ED 4/17 with nausea, vague chest pain, and feeling ill found to have AFib with rapid ventricular response.  confirmed to be covid positive, transferred to [facility] 4/18; pt husband is also at [facility]                Clinical Impression   PTA, pt was living with her husband and was perform BADLs; husband performed IADLs including driving. Pt currently requiring Min Guard for LB ADLs, toileting, and functional mobility. Pt presenting with decreased safety awareness, balance, and strength. Pt with increased RR during functional mobility and presenting with decreased activity tolerance. Pt and her husband are both admitted to [facility], COVID (+), and are deconditioned. Pt at a high risks for falls and reports only her husband for support at home. Pt would benefit from further acute OT to facilitate safe dc. Pending pt progress, recommend dc to SNF for further OT to optimize safety, independence with ADLs, and return to PLOF.   SpO2 89%-95% on RA. HR 90s. RR 39 (highest rate) with activity    Follow Up Recommendations  SNF;Supervision/Assistance - 24 hour    Equipment Recommendations  None recommended by OT    Recommendations for Other Services PT consult     Precautions / Restrictions Precautions Precautions: Fall Precaution Comments: Monitor HR and SpO2 Restrictions Weight Bearing Restrictions: No      Mobility Bed Mobility Overal bed mobility: Modified Independent Bed Mobility: Supine to Sit     Supine to sit: Modified independent (Device/Increase time)     General bed mobility comments: Increased time and effort.   Transfers Overall transfer level: Needs assistance Equipment used: None Transfers: Sit  to/from Stand Sit to Stand: Min guard         General transfer comment: Min Guard A for safety    Balance Overall balance assessment: Needs assistance Sitting-balance support: Feet supported;No upper extremity supported Sitting balance-Leahy Scale: Good     Standing balance support: During functional activity Standing balance-Leahy Scale: Good                             ADL either performed or assessed with clinical judgement   ADL Overall ADL's : Needs assistance/impaired Eating/Feeding: Set up;Supervision/ safety;Sitting   Grooming: Standing;Min guard;Brushing hair;Wash/dry hands   Upper Body Bathing: Supervision/ safety;Sitting   Lower Body Bathing: Min guard;Sit to/from stand   Upper Body Dressing : Supervision/safety;Sitting   Lower Body Dressing: Min guard;Sit to/from stand Lower Body Dressing Details (indicate cue type and reason): Min Guard A for safety Toilet Transfer: Min guard;Ambulation;Regular Teacher, adult educationToilet Toilet Transfer Details (indicate cue type and reason): Min Guard A for safety Toileting- Clothing Manipulation and Hygiene: Min guard;Sit to/from stand;Cueing for sequencing       Functional mobility during ADLs: Min guard General ADL Comments: Pt presenting with decreased activity tolerance, balance, and awareness. Pt with increased RR during functional mobility.      Vision Baseline Vision/History: Wears glasses Wears Glasses: At all times Patient Visual Report: No change from baseline       Perception     Praxis      Pertinent Vitals/Pain Pain Assessment: No/denies pain     Hand Dominance Right  Extremity/Trunk Assessment Upper Extremity Assessment Upper Extremity Assessment: Generalized weakness   Lower Extremity Assessment Lower Extremity Assessment: Defer to PT evaluation   Cervical / Trunk Assessment Cervical / Trunk Assessment: Other exceptions Cervical / Trunk Exceptions: Increased body habitus   Communication  Communication Communication: No difficulties   Cognition Arousal/Alertness: Awake/alert Behavior During Therapy: WFL for tasks assessed/performed Overall Cognitive Status: Within Functional Limits for tasks assessed                                     General Comments  SpO2 89%-95% on RA. HR 90s. RR 39 (highest rate) with activity    Exercises     Shoulder Instructions      Home Living Family/patient expects to be discharged to:: Private residence Living Arrangements: Spouse/significant other Available Help at Discharge: Family Type of Home: House Home Access: Stairs to enter Secretary/administrator of Steps: 2-3   Home Layout: One level     Bathroom Shower/Tub: Tub only   Firefighter: Standard     Home Equipment: None          Prior Functioning/Environment Level of Independence: Independent        Comments: Performs BADLs. does not drive. husband performs IADLs        OT Problem List: Decreased strength;Decreased range of motion;Decreased activity tolerance;Impaired balance (sitting and/or standing);Decreased cognition;Decreased safety awareness;Decreased knowledge of use of DME or AE;Decreased knowledge of precautions      OT Treatment/Interventions: Self-care/ADL training;Therapeutic exercise;Energy conservation;DME and/or AE instruction;Therapeutic activities;Patient/family education    OT Goals(Current goals can be found in the care plan section) Acute Rehab OT Goals Patient Stated Goal: "I want to sit on my front porch and watch the cars drive by." OT Goal Formulation: Patient unable to participate in goal setting Time For Goal Achievement: 07/04/18 Potential to Achieve Goals: Good  OT Frequency: Min 3X/week   Barriers to D/C: Decreased caregiver support  Both [name] have COVID and are deconditioned       Co-evaluation              AM-PAC OT "6 Clicks" Daily Activity     Outcome Measure Help from another  person eating meals?: None Help from another person taking care of personal grooming?: None Help from another person toileting, which includes using toliet, bedpan, or urinal?: A Little Help from another person bathing (including washing, rinsing, drying)?: A Little Help from another person to put on and taking off regular upper body clothing?: None Help from another person to put on and taking off regular lower body clothing?: A Little 6 Click Score: 21   End of Session Nurse Communication: Mobility status  Activity Tolerance: Patient tolerated treatment well Patient left: in bed;with call bell/phone within reach(sitting at EOB)  OT Visit Diagnosis: Unsteadiness on feet (R26.81);Other abnormalities of gait and mobility (R26.89);Muscle weakness (generalized) (M62.81)                Time: 6948-5462 OT Time Calculation (min): 11 min Charges:  OT General Charges $OT Visit: 1 Visit OT Evaluation $OT Eval Moderate Complexity: 1 Mod  Ryelle Ruvalcaba MSOT, OTR/L Acute Rehab Pager: [phone] Office: [phone]  Theodoro Grist Rami Budhu 06/20/2018, 6:37 PM

## 2018-06-20 NOTE — Progress Notes (Signed)
Inpatient Diabetes Program Recommendations  AACE/ADA: New Consensus Statement on Inpatient Glycemic Control  Target Ranges:  Prepandial:   less than 140 mg/dL      Peak postprandial:   less than 180 mg/dL (1-2 hours)      Critically ill patients:  140 - 180 mg/dL   Results for Wendy Summers, Wendy Summers (MRN [id number]) as of 06/20/2018 09:57  Ref. Range 06/19/2018 07:21 06/19/2018 11:49 06/19/2018 16:44 06/19/2018 20:50 06/20/2018 07:51  Glucose-Capillary Latest Ref Range: 70 - 99 mg/dL 676 (H) 195 (H) 093 (H) 168 (H) 161 (H)   Review of Glycemic Control  Diabetes history: DM2 Outpatient Diabetes medications: Metformin XR 2000 mg daily Current orders for Inpatient glycemic control: Lantus 12 units daily, Novolog 5 units TID with meals for meal coverage, Novolog 0-15 units TID with meals, Novolog 0-5 units QHS  Inpatient Diabetes Program Recommendations:   Insulin - Meal Coverage: Please consider increasing meal coverage to Novolog 8 units TID with meals.  Thanks, Orlando Penner, RN, MSN, CDE Diabetes Coordinator Inpatient Diabetes Program [phone] (Team Pager from 8am to 5pm)

## 2018-06-20 NOTE — Progress Notes (Signed)
Physical Therapy Treatment Patient Details Name: Wendy Summers MRN: [id number] DOB: April 07, 1950 Today's Date: 06/20/2018    History of Present Illness 68 y.o. female with a history of HTN, HLD, T2DM, BD-responsive restrictive lung disease, and PAF on eliquis who [facility]17 with nausea, vague chest pain, and feeling ill found to have AFib with rapid ventricular response.  confirmed to be covid positive, transferred to [facility] 4/18; pt husband is also at [facility]                 PT Comments     The  Patient's HR remained in 68 range during mobility. Patient on RA at 95%. Patient is  Progressing well and  Does not require Oxygen  Nor DME.   Follow Up Recommendations  Home health PT;Supervision for mobility/OOB     Equipment Recommendations  None recommended by PT    Recommendations for Other Services       Precautions / Restrictions Precautions Precaution Comments: monitor VS, HR-converted to NSR overnight    Mobility  Bed Mobility   Bed Mobility: Supine to Sit     Supine to sit: Modified independent (Device/Increase time)        Transfers Overall transfer level: Needs assistance Equipment used: None Transfers: Sit to/from Stand Sit to Stand: Supervision            Ambulation/Gait Ambulation/Gait assistance: Supervision Gait Distance (Feet): 80 Feet Assistive device: None Gait Pattern/deviations: WFL(Within Functional Limits)   Gait velocity interpretation: <1.31 ft/sec, indicative of household ambulator General Gait Details: gait is steady , no balance losses noted with turns and manipulating lines   Stairs             Wheelchair Mobility    Modified Rankin (Stroke Patients Only)       Balance           Standing balance support: During functional activity Standing balance-Leahy Scale: Good                              Cognition Arousal/Alertness: Awake/alert                                            Exercises      General Comments        Pertinent Vitals/Pain Pain Assessment: No/denies pain    Home Living                      Prior Function            PT Goals (current goals can now be found in the care plan section) Progress towards PT goals: Progressing toward goals    Frequency    Min 4X/week      PT Plan Current plan remains appropriate;Frequency needs to be updated    Co-evaluation              AM-PAC PT "6 Clicks" Mobility   Outcome Measure  Help needed turning from your back to your side while in a flat bed without using bedrails?: None Help needed moving from lying on your back to sitting on the side of a flat bed without using bedrails?: None Help needed moving to and from a bed to a chair (including a wheelchair)?: A Little Help needed standing up from a  chair using your arms (e.g., wheelchair or bedside chair)?: A Little Help needed to walk in hospital room?: A Little Help needed climbing 3-5 steps with a railing? : A Lot 6 Click Score: 19    End of Session Equipment Utilized During Treatment: Gait belt Activity Tolerance: Patient tolerated treatment well Patient left: in chair;with call bell/phone within reach;with family/visitor present Nurse Communication: Mobility status PT Visit Diagnosis: Unsteadiness on feet (R26.81)     Time:  -     Charges:                        Wendy Summers PT Acute Rehabilitation Services Pager [phone] Office [phone]    Wendy Summers 06/20/2018, 11:49 AM

## 2018-06-20 NOTE — Progress Notes (Signed)
PROGRESS NOTE  Wendy Summers  JJH:[id number] DOB: September 06, 1950 DOA: 06/16/2018 PCP: [facility]   Brief Narrative: Wendy Summers is a 68 y.o. female with a history of HTN, HLD, T2DM, BD-responsive restrictive lung disease, and PAF on eliquis who presented to [facility] ED 4/17 with nausea, vague chest pain, and feeling ill found to have AFib with rapid ventricular response. She began feeling ill about 2 weeks prior with intermittent fevers and some nonproductive cough. Her husband is currently admitted at [facility] for covid-19 infection. When her symptoms worsened and included chest pain and wheezing she sought care, was given diltiazem gtt for rate control and confirmed to be covid positive, transferred to [facility] 4/18. CXR demonstrated vascular congestion and BNP was elevated. She was given lasix and an inhaler for wheezing. She continues to have shortness of breath with any exertion and having high heart rate with rate control complicated by hypotension.  Assessment & Plan: Principal Problem:   Acute respiratory failure with hypoxia (HCC) Active Problems:   Asthmatic bronchitis   Atrial fibrillation with RVR (HCC)   Asthma   2019 novel coronavirus detected   T2DM (type 2 diabetes mellitus) (HCC)   HLD (hyperlipidemia)   HTN (hypertension)   Acute on chronic diastolic CHF (congestive heart failure) (HCC)   Obesity, Class III, BMI 40-49.9 (morbid obesity) (HCC)  Acute hypoxic respiratory failure due to covid-19 infection and AFib with RVR causing acute on chronic diastolic CHF.  - Continue supplemental oxygen to maintain SpO2 >90%. Will check ambulatory pulse oximetry prior to discharge.  PAF with RVR: Converted to NSR this AM.  - Stopped amio gtt, will decrease dilt po dose back today and monitor.   - Continue metoprolol 100mg  po BID home medications - Continue eliquis - TSH 1.181. Maintain K and Mg levels >4 and >2.  Acute on chronic diastolic CHF: Suspect tachycardia-mediated  worsening. Has recent myoview showing preserved EF, low risk.  - Will avoid exposure that would be required to repeat echo pending clinical trajectory.  - Crackles resolved, I/O incompletely charted. Will not repeat lasix today. Wt down 237lbs > 230lbs.   - Anticipate improved volume status now that is HR better controlled.  Covid-19 infection:  - Continue airborne, contact precautions. PPE including gown, gloves, face shield, cap, shoe covers, N-95 used during this encounter.  - Check daily labs: CBC w/diff, CMP, d-dimer, LDH, CRP. Generally improving trend. - Troponin is reassuringly negative.  - Avoid NSAIDs, will keep negative balance as above - Monitor for decompensation at which time off-labed use of HCQ would be considered, though QTc would need close monitoring.  Wheezing, history of asthma: Has never been hospitalized for asthma. PFTs reviewed showing restrictive lung disease with bronchodilator response.   - Avoid nebulized treatments if possible. Substituted with xopenex MDI TID. - Wheezing improving, suspect cardiogenic component. No steroids.  AKI: Cr improving from admission. Historical values indicate an abnormal baseline but has been as low as 0.9. - Avoid nephrotoxins - Continue monitoring.   T2DM uncontrolled with severe infection-induced hyperglycemia: HbA1c 7.5%. - This patient not on insulin at home. Fasting CBG 161 and postprandials higher, will increase mealtime insulin 5>8, continue lantus at 12; cont SSI, HS. - Holding metformin for now.  HLD:  - Continue statin  Morbid obesity: BMI 42. Poor prognostic finding. - Weight loss recommended.   Glaucoma: No symptoms at present - Reordered formulary equivalent of TID gtt's  DVT prophylaxis: Eliquis Code Status: Full Family Communication: Husband updated  in person (staying in the same hospital room) Disposition Plan: Home with [facility] likely in next 24 hours if HR stable while deescalating rate control agents.   Consultants:   Cardiology, Dr. Tenny Craw by phone 4/19.   Procedures:   None  Antimicrobials:  None   Subjective: Converted to NSR overnight, does not have palpitations or chest pain any more. Still having some upset stomach but no vomiting. No fevers, is >2 weeks from symptom onset. Very happy to be in the room with her husband.   Objective: Vitals:   06/20/18 0023 06/20/18 0446 06/20/18 0500 06/20/18 0622  BP: 110/73 109/60  128/72  Pulse:      Resp:      Temp: 97.9 F (36.6 C) 97.8 F (36.6 C)    TempSrc: Oral Oral    SpO2: 94%     Weight:   104.4 kg   Height:        Intake/Output Summary (Last 24 hours) at 06/20/2018 1005 Last data filed at 06/20/2018 0300 Gross per 24 hour  Intake 929.96 ml  Output 426 ml  Net 503.96 ml   Filed Weights   06/18/18 0434 06/19/18 0443 06/20/18 0500  Weight: 106.1 kg 104.5 kg 104.4 kg  Gen: Pleasant, obese female in no distress Pulm: Nonlabored breathing room air. Clear. CV: Regular borderline bradycardia. No new murmur, rub, or gallop. No JVD, no dependent edema. GI: Abdomen soft, non-tender, non-distended, with normoactive bowel sounds.  Ext: Warm, no deformities Skin: No rashes, lesions or ulcers on visualized skin. Neuro: Alert and oriented. No focal neurological deficits. Psych: Judgement and insight appear fair. Mood euthymic & affect congruent. Behavior is appropriate.  CBC: Recent Labs  Lab 06/16/18 0548 06/17/18 0615 06/18/18 0500 06/19/18 0450 06/20/18 0500  WBC 6.8 6.6 7.1 7.8 9.0  NEUTROABS  --   --  5.0 4.6 6.1  HGB 13.5 12.3 13.0 12.5 12.9  HCT 43.4 39.7 40.6 40.1 40.3  MCV 96.9 95.9 94.9 94.4 94.2  PLT 142* 290 323 345 370   Basic Metabolic Panel: Recent Labs  Lab 06/15/18 2036 06/16/18 0548 06/17/18 0615 06/18/18 0500 06/19/18 0450 06/20/18 0500  NA 134* 136 134* 134* 135 136  K 4.8 4.3 4.1 4.4 4.0 3.9  CL 99 104 99 101 100 100  CO2 21* 21* GLUCOSE 266* 235* 241* 311* 213* 191*  BUN  35* 28* 36* 34* 33* 26*  CREATININE 1.39* 1.23* 1.51* 1.42* 1.27* 1.14*  CALCIUM 8.6* 8.3* 8.3* 8.3* 8.7* 9.2  MG 1.9  --   --   --  2.1 2.2    Cardiac Enzymes: Recent Labs  Lab 06/15/18 2036 06/16/18 0912 06/16/18 1438  TROPONINI <0.03 <0.03 <0.03   CBG: Recent Labs  Lab 06/19/18 0721 06/19/18 1149 06/19/18 1644 06/19/18 2050 06/20/18 0751  GLUCAP 195* 201* 221* 168* 161*   Anemia Panel: Recent Labs    06/18/18 0500 06/19/18 0450  FERRITIN 138 113   Covid: Positive  Radiology Studies: No results found.  Scheduled Meds: . apixaban  5 mg Oral BID  . atorvastatin  40 mg Oral QHS  . brimonidine  1 drop Both Eyes TID  . brinzolamide  1 drop Both Eyes TID  . diltiazem  90 mg Oral Q8H  . docusate sodium  100 mg Oral BID  . insulin aspart  0-15 Units Subcutaneous TID WC  . insulin aspart  0-5 Units Subcutaneous QHS  . insulin aspart  8 Units Subcutaneous TID WC  .  insulin glargine  12 Units Subcutaneous Daily  . levalbuterol  2 puff Inhalation TID  . mouth rinse  15 mL Mouth Rinse BID  . metoprolol tartrate  100 mg Oral BID  . sodium chloride flush  3 mL Intravenous Q12H   Continuous Infusions: . sodium chloride 10 mL/hr at 06/20/18 0300  . amiodarone 30 mg/hr (06/20/18 0300)     LOS: 4 days   Time spent: 35 minutes.  Tyrone Nineyan B Carling Liberman, MD [facility] [email or web address] Password [email or web address] 06/20/2018, 10:05 AM

## 2018-06-21 DIAGNOSIS — J9601 Acute respiratory failure with hypoxia: Secondary | ICD-10-CM

## 2018-06-21 LAB — D-DIMER, QUANTITATIVE: D-Dimer, Quant: 1.27 ug/mL-FEU — ABNORMAL HIGH (ref 0.00–0.50)

## 2018-06-21 LAB — CBC WITH DIFFERENTIAL/PLATELET
Abs Immature Granulocytes: 0.09 10*3/uL — ABNORMAL HIGH (ref 0.00–0.07)
Basophils Absolute: 0 10*3/uL (ref 0.0–0.1)
Basophils Relative: 1 %
Eosinophils Absolute: 0.4 10*3/uL (ref 0.0–0.5)
Eosinophils Relative: 5 %
HCT: 38.7 % (ref 36.0–46.0)
Hemoglobin: 12.2 g/dL (ref 12.0–15.0)
Immature Granulocytes: 1 %
Lymphocytes Relative: 22 %
Lymphs Abs: 1.8 10*3/uL (ref 0.7–4.0)
MCH: 30.3 pg (ref 26.0–34.0)
MCHC: 31.5 g/dL (ref 30.0–36.0)
MCV: 96.3 fL (ref 80.0–100.0)
Monocytes Absolute: 0.7 10*3/uL (ref 0.1–1.0)
Monocytes Relative: 9 %
Neutro Abs: 5.1 10*3/uL (ref 1.7–7.7)
Neutrophils Relative %: 62 %
Platelets: 367 10*3/uL (ref 150–400)
RBC: 4.02 MIL/uL (ref 3.87–5.11)
RDW: 13 % (ref 11.5–15.5)
WBC: 8.1 10*3/uL (ref 4.0–10.5)
nRBC: 0 % (ref 0.0–0.2)

## 2018-06-21 LAB — COMPREHENSIVE METABOLIC PANEL
ALT: 21 U/L (ref 0–44)
AST: 17 U/L (ref 15–41)
Albumin: 3.5 g/dL (ref 3.5–5.0)
Alkaline Phosphatase: 103 U/L (ref 38–126)
Anion gap: 10 (ref 5–15)
BUN: 27 mg/dL — ABNORMAL HIGH (ref 8–23)
CO2: 27 mmol/L (ref 22–32)
Calcium: 9.9 mg/dL (ref 8.9–10.3)
Chloride: 100 mmol/L (ref 98–111)
Creatinine, Ser: 1.16 mg/dL — ABNORMAL HIGH (ref 0.44–1.00)
GFR calc Af Amer: 57 mL/min — ABNORMAL LOW (ref >60)
GFR calc non Af Amer: 49 mL/min — ABNORMAL LOW (ref >60)
Glucose, Bld: 230 mg/dL — ABNORMAL HIGH (ref 70–99)
Potassium: 4.5 mmol/L (ref 3.5–5.1)
Sodium: 137 mmol/L (ref 135–145)
Total Bilirubin: 0.4 mg/dL (ref 0.3–1.2)
Total Protein: 7 g/dL (ref 6.5–8.1)

## 2018-06-21 LAB — MAGNESIUM: Magnesium: 2.1 mg/dL (ref 1.7–2.4)

## 2018-06-21 LAB — GLUCOSE, CAPILLARY
Glucose-Capillary: 196 mg/dL — ABNORMAL HIGH (ref 70–99)
Glucose-Capillary: 152 mg/dL — ABNORMAL HIGH (ref 70–99)
Glucose-Capillary: 295 mg/dL — ABNORMAL HIGH (ref 70–99)

## 2018-06-21 LAB — C-REACTIVE PROTEIN: CRP: 2.1 mg/dL — ABNORMAL HIGH (ref <1.0)

## 2018-06-21 LAB — LACTATE DEHYDROGENASE: LDH: 173 U/L (ref 98–192)

## 2018-06-21 MED ORDER — FUROSEMIDE 10 MG/ML IJ SOLN
60.0000 mg | Freq: Once | INTRAMUSCULAR | Status: AC
  Administered 2018-06-21: 11:00:00 60 mg via INTRAVENOUS
  Filled 2018-06-21: qty 6

## 2018-06-21 NOTE — Progress Notes (Signed)
Occupational Therapy Treatment Patient Details Name: Wendy Summers MRN: [id number] DOB: 01/14/1951 Today's Date: 06/21/2018    History of present illness 68 y.o. female with a history of HTN, HLD, T2DM, BD-responsive restrictive lung disease, and PAF on eliquis who presented to [facility] ED 4/17 with nausea, vague chest pain, and feeling ill found to have AFib with rapid ventricular response.  confirmed to be covid positive, transferred to [facility] 4/18; pt husband is also at [facility]                OT comments  Pt presents sitting up in recliner, agreeable to therapy session. Pt performing room level mobility with HHA and minA; completing toileting and standing grooming ADL with minguard-minA throughout. Pt on RA during functional tasks, noted SpO2 down to 84% with activity, especially once bending forward with ADL completion; sats return to >90% on RA once more upright and end of session once seated in recliner. Pt noted to have increased difficulty with LB dressing ADL and suspect with peri-care as noted to have some redness in buttocks region. Will plan to initiate education of AE as well as further address energy conservation strategies in following treatment session. Noted pt returning home vs SNF at time of discharge, have updated discharge recommendations to reflect. Pt will require HH OT services to maximize her safety and independence with ADL and mobility. Will continue to follow acutely.    Follow Up Recommendations  Home health OT;Supervision/Assistance - 24 hour    Equipment Recommendations  3 in 1 bedside commode;Tub/shower bench          Precautions / Restrictions Precautions Precautions: Fall Precaution Comments: Monitor HR and SpO2, had "weak spell " while ambulating. Restrictions Weight Bearing Restrictions: No       Mobility Bed Mobility               General bed mobility comments: received up in recliner  Transfers Overall transfer level: Needs assistance Equipment  used: None Transfers: Sit to/from Stand Sit to Stand: Supervision         General transfer comment: from recliner and toilet, supervision for safety and immediate standing balance    Balance Overall balance assessment: Needs assistance   Sitting balance-Leahy Scale: Good     Standing balance support: During functional activity;Single extremity supported Standing balance-Leahy Scale: Fair Standing balance comment: use of single UE support with dynamic mobility                           ADL either performed or assessed with clinical judgement   ADL Overall ADL's : Needs assistance/impaired     Grooming: Wash/dry hands;Minimal assistance;Min guard;Standing               Lower Body Dressing: Minimal assistance;Sit to/from stand Lower Body Dressing Details (indicate cue type and reason): pt with increased difficulty bringing LEs up to adjust her socks, discussed option of sock aide - will plan to educate in following sessions Toilet Transfer: Min guard;Minimal assistance;Ambulation;Regular Toilet;Grab bars   Toileting- Clothing Manipulation and Hygiene: Minimal assistance;Min guard;Sit to/from stand Toileting - Clothing Manipulation Details (indicate cue type and reason): pt performing peri-care in standing, notable redness/skin breakdown on buttocks - suspect pt may have difficulty performing peri-care with BMs, will educate on AE      Functional mobility during ADLs: Min guard;Minimal assistance General ADL Comments: pt continues to have decreased activity tolerance and SOB with activity  Vision       Perception     Praxis      Cognition Arousal/Alertness: Awake/alert(more lethargic end of session) Behavior During Therapy: WFL for tasks assessed/performed Overall Cognitive Status: No family/caregiver present to determine baseline cognitive functioning                                 General Comments: noted some delay in processing,  decreased awareness of current deficits/safety; suspect pt may be close to her baseline         Exercises     Shoulder Instructions       General Comments Pt on RA during session, lowest sat noted 84% during toileting task (most notable when bending forward with toileting), increased to 90% and above with standing upright, seated rest and cues for deep breathing; SpO2 >94% end of session once seated in recliner on RA    Pertinent Vitals/ Pain       Pain Assessment: No/denies pain  Home Living                                          Prior Functioning/Environment              Frequency  Min 3X/week        Progress Toward Goals  OT Goals(current goals can now be found in the care plan section)  Progress towards OT goals: Progressing toward goals  Acute Rehab OT Goals Patient Stated Goal: ready to go home to her dog OT Goal Formulation: With patient Time For Goal Achievement: 07/04/18 Potential to Achieve Goals: Good  Plan Discharge plan needs to be updated    Co-evaluation                 AM-PAC OT "6 Clicks" Daily Activity     Outcome Measure   Help from another person eating meals?: None Help from another person taking care of personal grooming?: A Little Help from another person toileting, which includes using toliet, bedpan, or urinal?: A Little Help from another person bathing (including washing, rinsing, drying)?: A Little Help from another person to put on and taking off regular upper body clothing?: None Help from another person to put on and taking off regular lower body clothing?: A Little 6 Click Score: 20    End of Session    OT Visit Diagnosis: Unsteadiness on feet (R26.81);Other abnormalities of gait and mobility (R26.89);Muscle weakness (generalized) (M62.81)   Activity Tolerance Patient tolerated treatment well;Patient limited by fatigue   Patient Left in chair;with call bell/phone within reach;with nursing/sitter  in room   Nurse Communication Mobility status(desats)        Time: [id number] OT Time Calculation (min): 14 min  Charges: OT General Charges $OT Visit: 1 Visit OT Treatments $Self Care/Home Management : 8-22 mins  Marcy SirenBreanna Oval Cavazos, OT Supplemental Rehabilitation Services Pager [phone] Office [phone]    Orlando PennerBreanna L Wilfredo Canterbury 06/21/2018, 4:10 PM

## 2018-06-21 NOTE — TOC Progression Note (Addendum)
Transition of Care [facility]) - Progression Note    Patient Details  Name: Wendy Summers MRN: [id number] Date of Birth: 01-27-1951  Transition of [facility]) CM/SW Contact  Colleen Can RN, BSN, NCM-BC, ACM-RN [phone] (Working remotely) Phone Number: 06/21/2018, 12:53 PM  Clinical Narrative:    CM continue to follow for dispositional needs. CM spoke to the patient and her spouse Brysa Preziosi via phone (speakerphone) to discuss the patients DME needs. Per Dr. Thedore Mins DME orders, patient will need home oxygen and a rollator for home. DME preference provided with [employer] selected. CM will contact the [employer] liaison to coordinate DME services. Patient indicated she will tentatively transition home tomorrow, with her spouse to transition home today. CM team will continue to follow.   Addendum 06/21/18 @ 1628-Catalena Stanhope RNCM-OT eval complete with HHOT/tub bench and 3n1 recommended. CM requested orders as recommended with the HH/DME agencies updated. CM will continue to follow.   Expected Discharge Plan: Home w Home Health Services Barriers to Discharge: Continued Medical Work up  Expected Discharge Plan and Services Expected Discharge Plan: Home w Home Health Services   Discharge Planning Services: CM Consult Post Acute Care Choice: Durable Medical Equipment, Home Health Living arrangements for the past 2 months: Single Family Home                 DME Arranged: Walker rolling with seat, Oxygen DME Agency: [facility] Date DME Agency Contacted: 06/21/18 Time DME Agency Contacted: 1317 Representative spoke with at DME Agency: Durward Fortes HH Arranged: RN, Disease Management, PT, Nurse's Aide HH Agency: [facility] ([facility] Date HH Agency Contacted: 06/21/18 Time HH Agency Contacted: 1318 Representative spoke with at [facility] Agency: Dara Hoyer  Patient Goals and CMS Choice   CMS Medicare.gov Compare Post Acute Care list provided to:: Patient Choice offered to / list  presented to : Patient   Permission Sought/Granted Permission sought to share information with : Case Manager           Admission diagnosis:  COVID-19 POSITIVE  Patient Active Problem List   Diagnosis Date Noted  . Acute respiratory failure with hypoxia (HCC) 06/17/2018  . Acute on chronic diastolic CHF (congestive heart failure) (HCC) 06/17/2018  . Obesity, Class III, BMI 40-49.9 (morbid obesity) (HCC) 06/17/2018  . Atrial flutter with rapid ventricular response (HCC) 06/16/2018  . 2019 novel coronavirus detected 06/16/2018  . T2DM (type 2 diabetes mellitus) (HCC) 06/16/2018  . HLD (hyperlipidemia) 06/16/2018  . HTN (hypertension) 06/16/2018  . Coronavirus infection 06/16/2018  . Atrial fibrillation with RVR (HCC) 06/15/2018  . Diabetes (HCC) 06/15/2018  . Asthma 06/15/2018  . Sepsis (HCC) 04/21/2018  . Asthmatic bronchitis 07/14/2016  . Near syncope 07/13/2016   PCP:  Inc, [facility] [facility] Pharmacy:   [facility] [address] [place] - [address] [place] [address] Phone: [phone] Fax: [phone]

## 2018-06-21 NOTE — Progress Notes (Signed)
Physical Therapy Treatment Patient Details Name: Wendy Summers J Moncure MRN: [id number] DOB: 1950-04-28 Today's Date: 06/21/2018    History of Present Illness 68 y.o. female with a history of HTN, HLD, T2DM, BD-responsive restrictive lung disease, and PAF on eliquis who [facility]/17 with nausea, vague chest pain, and feeling ill found to have AFib with rapid ventricular response.  confirmed to be covid positive, transferred to [facility] 4/18; [facility]                 PT Comments    Patient cheerful, dynamic. Ambulated  X 20' x 2 with no assistance on RA, sats 95%. Patient then ambulated x 120'on RA saturation 100%., HR up to 110. Without warning patient stopped ambulating, closed eyes and did not speak. Patient did remain standing with PT support until recliner brought up.  RN summoned.  VS-BP164/77, HR 93, O2 sats 93% on RA. Patient remained very somnolent, very different presentation than beginning of session.  MD aware of episode . Patient not to DC today.  Continue PT.   Follow Up Recommendations  Home health PT;Supervision for mobility/OOB     Equipment Recommendations  None recommended by PT    Recommendations for Other Services       Precautions / Restrictions Precautions Precautions: Fall Precaution Comments: Monitor HR and SpO2, had "weak spell " while ambulating.    Mobility  Bed Mobility         Supine to sit: Min assist     General bed mobility comments: Increased time and effort.   Transfers Overall transfer level: Needs assistance Equipment used: None;1 person hand held assist Transfers: Sit to/from Stand Sit to Stand: Supervision         General transfer comment: from recliner and toilet prior to "weak spell" minguard after to get up from recliner to return to bed  Ambulation/Gait Ambulation/Gait assistance: Min guard Gait Distance (Feet): 120 Feet Assistive device: None;1 person hand held assist Gait Pattern/deviations:  WFL(Within Functional Limits) Gait velocity: decreased   General Gait Details: gait is steady , no balance losses noted with turns  after  ambulating x 120' patient became less talkative, stopped ambulating. Call out for chair to be brought up. Reuruned to room  for VS. Patient rem,aind somnolent but responded to name.   Stairs             Wheelchair Mobility    Modified Rankin (Stroke Patients Only)       Balance Overall balance assessment: Needs assistance   Sitting balance-Leahy Scale: Good     Standing balance support: During functional activity;No upper extremity supported Standing balance-Leahy Scale: Good                              Cognition Arousal/Alertness: Awake/alert Behavior During Therapy: WFL for tasks assessed/performed                                   General Comments: patient oriented x 4 after weak spell      Exercises      General Comments        Pertinent Vitals/Pain Pain Assessment: No/denies pain    Home Living                      Prior Function  PT Goals (current goals can now be found in the care plan section) Progress towards PT goals: Progressing toward goals    Frequency    Min 4X/week      PT Plan Current plan remains appropriate    Co-evaluation              AM-PAC PT "6 Clicks" Mobility   Outcome Measure  Help needed turning from your back to your side while in a flat bed without using bedrails?: None Help needed moving from lying on your back to sitting on the side of a flat bed without using bedrails?: None Help needed moving to and from a bed to a chair (including a wheelchair)?: A Little Help needed standing up from a chair using your arms (e.g., wheelchair or bedside chair)?: A Little Help needed to walk in hospital room?: A Little Help needed climbing 3-5 steps with a railing? : A Lot 6 Click Score: 19    End of Session   Activity Tolerance:  Treatment limited secondary to medical complications (Comment) Patient left: in bed;with call bell/phone within reach;with family/visitor present Nurse Communication: Mobility status PT Visit Diagnosis: Unsteadiness on feet (R26.81)     Time: 1010-1039 PT Time Calculation (min) (ACUTE ONLY): 29 min  Charges:  $Gait Training: 23-37 mins                     Blanchard Kelch PT Acute Rehabilitation Services Pager [phone] Office [phone]    Rada Hay 06/21/2018, 10:55 AM

## 2018-06-21 NOTE — Progress Notes (Signed)
PROGRESS NOTE                                                                                                                                                                                                             Patient Demographics:    Wendy Summers, is a 68 y.o. female, DOB - 02/16/1951, HQI:[id number]  Outpatient Primary MD for the patient is [facility] [employer] [facility]    LOS - 5  CC = SOB     Brief Narrative Wendy Summers is a 68 y.o. female with a history of HTN, HLD, T2DM, BD-responsive restrictive lung disease, and PAF on eliquis who presented to [facility] ED 4/17 with nausea, vague chest pain, and feeling ill found to have AFib with rapid ventricular response. She began feeling ill about 2 weeks prior with intermittent fevers and some nonproductive cough. Her husband is currently admitted at [facility] for covid-19 infection. When her symptoms worsened and included chest pain and wheezing she sought care, was given diltiazem gtt for rate control and confirmed to be covid positive, transferred to [facility] 4/18.    Work-up suggested acute hypoxic respiratory failure due to combination of acute on chronic diastolic CHF, possible COVID-19 pneumonia, new onset A. fib RVR.   Subjective:    Wendy Summers today has, No headache, No chest pain, No abdominal pain - No Nausea, No new weakness tingling or numbness, No Cough - Mild SOB.     Assessment  & Plan :     1. Acute Hypoxic Resp. Failure due to Acute Covid 19 Viral Illness during the ongoing 2020 Covid 19 Pandemic along with mild acute on chronic diastolic CHF with EF 55% because due to new onset A. fib RVR -.  With supportive care, hypoxia and shortness of breath are improving she is currently on 1 L nasal cannula oxygen.  There is some element of obesity related hypoventilation.  Will also require continued diuresis, will dose IV Lasix on 06/21/2018.  Flutter valve added for pulmonary  toiletry.  Continue supplemental oxygen, advance activity.  Likely discharge in a day or 2.  Inflammatory markers are satisfactory.  2.  Acute on chronic diastolic CHF.  EF 55%.  Diurese with IV Lasix.  Placed on fluid restriction.  Continue beta-blocker.  3.  New onset A. fib with RVR.  [place] vas 2 score  of at least 3.  Currently on Eliquis, beta-blocker and Cardizem.  And rate control.  Will check TSH.  4.  Morbid obesity.  BMI 42.  Follow with PCP for weight loss.  5.  Dyslipidemia.  On statin.  6.  Glaucoma.  Stable continue eyedrops.  7. DM2 - on Lantus + ISS  Lab Results  Component Value Date   HGBA1C 7.5 (H) 06/17/2018   CBG (last 3)  Recent Labs    06/20/18 1636 06/20/18 2105 06/21/18 0759  GLUCAP 167* 173* 196*      Condition - Fair  Family Communication  :  Husband  Code Status :  Full  Diet : Heart healthy, low carbohydrate.  1.5 L/day fluid restriction  Disposition Plan  : Home tomorrow  Consults  : None  Procedures  :    Echo from 2018 reviewed.  EF 55%.  No acute abnormality.  DVT Prophylaxis  :  Eliquis  Lab Results  Component Value Date   PLT 367 06/21/2018    Inpatient Medications  Scheduled Meds: . apixaban  5 mg Oral BID  . atorvastatin  40 mg Oral QHS  . brimonidine  1 drop Both Eyes TID  . brinzolamide  1 drop Both Eyes TID  . diltiazem  90 mg Oral Q8H  . docusate sodium  100 mg Oral BID  . furosemide  60 mg Intravenous Once  . insulin aspart  0-15 Units Subcutaneous TID WC  . insulin aspart  0-5 Units Subcutaneous QHS  . insulin aspart  8 Units Subcutaneous TID WC  . insulin glargine  12 Units Subcutaneous Daily  . levalbuterol  2 puff Inhalation TID  . mouth rinse  15 mL Mouth Rinse BID  . metoprolol tartrate  100 mg Oral BID   Continuous Infusions: . amiodarone Stopped (06/20/18 0304)   PRN Meds:.acetaminophen **OR** [DISCONTINUED] acetaminophen, alum & mag hydroxide-simeth, bisacodyl, ondansetron **OR** [DISCONTINUED]  ondansetron (ZOFRAN) IV, traMADol, traZODone  Antibiotics  :    Anti-infectives (From admission, onward)   None       Time Spent in minutes  30   Susa Raring M.D on 06/21/2018 at 10:19 AM  To page go to [email or web address] - password [facility]  [facility] -  Office  [phone]    See all Orders from today for further details   Admit date - 06/16/2018    5    Objective:   Vitals:   06/21/18 0500 06/21/18 0520 06/21/18 0604 06/21/18 0752  BP:  136/88  118/60  Pulse:   65 63  Resp:   15 16  Temp:    97.7 F (36.5 C)  TempSrc:    Oral  SpO2:   94% 93%  Weight: 110.5 kg     Height:        Wt Readings from Last 3 Encounters:  06/21/18 110.5 kg  06/15/18 103.8 kg  04/23/18 107.3 kg     Intake/Output Summary (Last 24 hours) at 06/21/2018 1019 Last data filed at 06/20/2018 2200 Gross per 24 hour  Intake 191.86 ml  Output -  Net 191.86 ml     Physical Exam  Telemetry interview done actual physical exam deferred to my partner Dr. Jerral Ralph    Data Review:    CBC Recent Labs  Lab 06/17/18 0615 06/18/18 0500 06/19/18 0450 06/20/18 0500 06/21/18 0500  WBC 6.6 7.1 7.8 9.0 8.1  HGB 12.3 13.0 12.5 12.9 12.2  HCT 39.7 40.6 40.1 40.3 38.7  PLT 290 323 345  370 367  MCV 95.9 94.9 94.4 94.2 96.3  MCH 29.7 30.4 29.4 30.1 30.3  MCHC 31.0 32.0 31.2 32.0 31.5  RDW 13.0 13.1 12.9 12.9 13.0  LYMPHSABS  --  1.6 2.3 1.9 1.8  MONOABS  --  0.4 0.6 0.7 0.7  EOSABS  --  0.0 0.2 0.3 0.4  BASOSABS  --  0.0 0.0 0.0 0.0    Chemistries  Recent Labs  Lab 06/15/18 2036  06/17/18 0615 06/18/18 0500 06/19/18 0450 06/20/18 0500 06/21/18 0500  NA 134*   < > 134* 134* 135 136 137  K 4.8   < > 4.1 4.4 4.0 3.9 4.5  CL 99   < > 99 101 100 100 100  CO2 21*   < > 24 24 26 26 27   GLUCOSE 266*   < > 241* 311* 213* 191* 230*  BUN 35*   < > 36* 34* 33* 26* 27*  CREATININE 1.39*   < > 1.51* 1.42* 1.27* 1.14* 1.16*  CALCIUM 8.6*   < > 8.3* 8.3* 8.7* 9.2 9.9  MG 1.9  --   --    --  2.1 2.2 2.1  AST  --   --   --  18 15 21 17   ALT  --   --   --  22 18 20 21   ALKPHOS  --   --   --  96 89 100 103  BILITOT  --   --   --  0.4 0.3 0.4 0.4   < > = values in this interval not displayed.   ------------------------------------------------------------------------------------------------------------------ No results for input(s): CHOL, HDL, LDLCALC, TRIG, CHOLHDL, LDLDIRECT in the last 72 hours.  Lab Results  Component Value Date   HGBA1C 7.5 (H) 06/17/2018   ------------------------------------------------------------------------------------------------------------------ No results for input(s): TSH, T4TOTAL, T3FREE, THYROIDAB in the last 72 hours.  Invalid input(s): FREET3 ------------------------------------------------------------------------------------------------------------------ Recent Labs    06/19/18 0450  FERRITIN 113    Coagulation profile No results for input(s): INR, PROTIME in the last 168 hours.  Recent Labs    06/20/18 0500 06/21/18 0500  DDIMER 1.15* 1.27*    Cardiac Enzymes Recent Labs  Lab 06/15/18 2036 06/16/18 0912 06/16/18 1438  TROPONINI <0.03 <0.03 <0.03   ------------------------------------------------------------------------------------------------------------------    Component Value Date/Time   BNP 358.0 (H) 06/15/2018 2036    Micro Results Recent Results (from the past 240 hour(s))  SARS Coronavirus 2 [facility](Hospital order, Per[facility])     Status: Abnormal   Collection Time: 06/16/18  1:34 AM  Result Value Ref Range Status   SARS Coronavirus 2 POSITIVE (A) NEGATIVE Final    Comment: RESULT CALLED TO, READ BACK BY AND VERIFIED WITH: KAT MURRAY RN 06/16/2018 @ 0245 RDW (NOTE) If result is NEGATIVE SARS-CoV-2 target nucleic acids are NOT DETECTED. The SARS-CoV-2 RNA is generally detectable in upper and lower  respiratory specimens during the acute phase of infection. The lowest   concentration of SARS-CoV-2 viral copies this assay can detect is 250  copies / mL. A negative result does not preclude SARS-CoV-2 infection  and should not be used as the sole basis for treatment or other  patient management decisions.  A negative result may occur with  improper specimen collection / handling, submission of specimen other  than nasopharyngeal swab, presence of viral mutation(s) within the  areas targeted by this assay, and inadequate number of viral copies  (<250 copies / mL). A negative result must be combined with clinical  observations, patient history, and epidemiological information. If result is POSITIVE SARS-CoV-2 target nucleic acids are DETECTED. T he SARS-CoV-2 RNA is generally detectable in upper and lower  respiratory specimens during the acute phase of infection.  Positive  results are indicative of active infection with SARS-CoV-2.  Clinical  correlation with patient history and other diagnostic information is  necessary to determine patient infection status.  Positive results do  not rule out bacterial infection or co-infection with other viruses. If result is PRESUMPTIVE POSTIVE SARS-CoV-2 nucleic acids MAY BE PRESENT.   A presumptive positive result was obtained on the submitted specimen  and confirmed on repeat testing.  While 2019 novel coronavirus  (SARS-CoV-2) nucleic acids may be present in the submitted sample  additional confirmatory testing may be necessary for epidemiological  and / or clinical management purposes  to differentiate between  SARS-CoV-2 and other Sarbecovirus currently known to infect humans.  If clinically indicated additional testing with an alternate test  methodology [phone]) is  advised. The SARS-CoV-2 RNA is generally  detectable in upper and lower respiratory specimens during the acute  phase of infection. The expected result is Negative. Fact Sheet for Patients:  BoilerBrush.com.cy Fact Sheet  for Healthcare Providers: https://pope.com/ This test is not yet approved or cleared by the [place] FDA and has been authorized for detection and/or diagnosis of SARS-CoV-2 by FDA under an Emergency Use Authorization (EUA).  This EUA will remain in effect (meaning this test can be used) for the duration of the COVID-19 declaration under Section 564(b)(1) of the Act, 21 U.S.C. section 360bbb-3(b)(1), unless the authorization is terminated or revoked sooner. Performed at [facility], [address], [place], [place] [address]     Radiology Reports Dg Chest [place] 1 View  Result Date: 06/15/2018 CLINICAL DATA:  Nausea. EXAM: PORTABLE CHEST 1 VIEW COMPARISON:  03/08/2018 FINDINGS: 2038 hours. Patient rotated to the right. The cardio pericardial silhouette is enlarged. There is pulmonary vascular congestion without overt pulmonary edema. Probable interstitial pulmonary edema. No substantial pleural effusion. The visualized bony structures of the thorax are intact. Telemetry leads overlie the chest. IMPRESSION: Cardiomegaly with vascular congestion and probable interstitial edema. Electronically Signed   By: Kennith Center M.D.   On: 06/15/2018 21:28

## 2018-06-21 NOTE — Progress Notes (Signed)
Patient's SPo2 at 83-85 on room applied oxygen at 1 liter

## 2018-06-22 LAB — BASIC METABOLIC PANEL
Anion gap: 8 (ref 5–15)
BUN: 28 mg/dL — ABNORMAL HIGH (ref 8–23)
CO2: 30 mmol/L (ref 22–32)
Calcium: 9.5 mg/dL (ref 8.9–10.3)
Chloride: 102 mmol/L (ref 98–111)
Creatinine, Ser: 1.31 mg/dL — ABNORMAL HIGH (ref 0.44–1.00)
GFR calc Af Amer: 49 mL/min — ABNORMAL LOW (ref >60)
GFR calc non Af Amer: 42 mL/min — ABNORMAL LOW (ref >60)
Glucose, Bld: 184 mg/dL — ABNORMAL HIGH (ref 70–99)
Potassium: 4 mmol/L (ref 3.5–5.1)
Sodium: 140 mmol/L (ref 135–145)

## 2018-06-22 LAB — GLUCOSE, CAPILLARY
Glucose-Capillary: 145 mg/dL — ABNORMAL HIGH (ref 70–99)
Glucose-Capillary: 172 mg/dL — ABNORMAL HIGH (ref 70–99)
Glucose-Capillary: 165 mg/dL — ABNORMAL HIGH (ref 70–99)
Glucose-Capillary: 283 mg/dL — ABNORMAL HIGH (ref 70–99)

## 2018-06-22 LAB — BRAIN NATRIURETIC PEPTIDE: B Natriuretic Peptide: 127.9 pg/mL — ABNORMAL HIGH (ref 0.0–100.0)

## 2018-06-22 LAB — TSH: TSH: 3.355 u[IU]/mL (ref 0.350–4.500)

## 2018-06-22 MED ORDER — METOPROLOL TARTRATE 100 MG PO TABS: 100.0000 mg | ORAL_TABLET | Freq: Two times a day (BID) | ORAL | 0 refills | Status: DC

## 2018-06-22 MED ORDER — FUROSEMIDE 10 MG/ML IJ SOLN
60.0000 mg | Freq: Once | INTRAMUSCULAR | Status: AC
  Administered 2018-06-22: 10:00:00 60 mg via INTRAVENOUS
  Filled 2018-06-22 (×2): qty 6

## 2018-06-22 MED ORDER — POTASSIUM CHLORIDE CRYS ER 20 MEQ PO TBCR
20.0000 meq | EXTENDED_RELEASE_TABLET | Freq: Once | ORAL | Status: AC
  Administered 2018-06-22: 20 meq via ORAL
  Filled 2018-06-22: qty 1

## 2018-06-22 NOTE — Care Management (Addendum)
CM spoke to the [facility] to discuss providing equipment from the DME closet from the patients chosen provider- [employer]; a portable oxygen tank and BSC will be provided to the patients room prior to the patient transitioning home. [employer] will deliver the home oxygen concentrator, tub bench and rollator to the patients home today by 1140. HH is arranged with [facility] with a SOC within 24-48 hours.  Colleen Can RN, BSN, NCM-BC, ACM-RN [phone]

## 2018-06-22 NOTE — Progress Notes (Signed)
Physical Therapy Treatment Patient Details Name: Wendy Summers MRN: [id number] DOB: Sep 18, 1950 Today's Date: 06/22/2018    History of Present Illness 68 y.o. female with a history of HTN, HLD, T2DM, BD-responsive restrictive lung disease, and PAF on eliquis who [facility] ED 4/17 with nausea, vague chest pain, and feeling ill found to have AFib with rapid ventricular response.  confirmed to be covid positive, transferred to [facility] 4[facility]; pt husband is also at [facility]                 PT Comments    Pt progressing well; SpO2 = 88-97% on RA during session, HR in 60s-70s; will benefit from HHPT   Follow Up Recommendations  Home health PT;Supervision for mobility/OOB     Equipment Recommendations  None recommended by PT    Recommendations for Other Services       Precautions / Restrictions Precautions Precautions: Fall Precaution Comments: monitor O2 adn HR Restrictions Weight Bearing Restrictions: No    Mobility  Bed Mobility               General bed mobility comments: pt reports mod I  Transfers Overall transfer level: Needs assistance Equipment used: None Transfers: Sit to/from Stand Sit to Stand: Supervision;Modified independent (Device/Increase time)         General transfer comment: for safety, standing from toilet, bed, and recliner  Ambulation/Gait Ambulation/Gait assistance: Supervision;Min guard Gait Distance (Feet): 110 Feet Assistive device: None Gait Pattern/deviations: Step-through pattern;Decreased stride length;Wide base of support Gait velocity: decreased   General Gait Details: mildly unsteady gait without overt LOB; cues for trunk extension and posture   Stairs             Wheelchair Mobility    Modified Rankin (Stroke Patients Only)       Balance Overall balance assessment: Needs assistance   Sitting balance-Leahy Scale: Good       Standing balance-Leahy Scale: Fair Standing balance comment: use of single UE support  with dynamic mobility                            Cognition Arousal/Alertness: Awake/alert Behavior During Therapy: WFL for tasks assessed/performed Overall Cognitive Status: Within Functional Limits for tasks assessed                                        Exercises General Exercises - Lower Extremity Ankle Circles/Pumps: AROM;Seated;10 reps Long Arc Quad: AROM;Strengthening;10 reps    General Comments        Pertinent Vitals/Pain Pain Assessment: No/denies pain    Home Living                      Prior Function            PT Goals (current goals can now be found in the care plan section) Acute Rehab PT Goals Patient Stated Goal: ready to go home to her dog PT Goal Formulation: With patient Time For Goal Achievement: 07/02/18 Potential to Achieve Goals: Good Progress towards PT goals: Progressing toward goals    Frequency    Min 4X/week      PT Plan Current plan remains appropriate    Co-evaluation              AM-PAC PT "6 Clicks" Mobility   Outcome Measure  Help  needed turning from your back to your side while in a flat bed without using bedrails?: None Help needed moving from lying on your back to sitting on the side of a flat bed without using bedrails?: None Help needed moving to and from a bed to a chair (including a wheelchair)?: A Little Help needed standing up from a chair using your arms (e.g., wheelchair or bedside chair)?: A Little Help needed to walk in hospital room?: A Little Help needed climbing 3-5 steps with a railing? : A Little 6 Click Score: 20    End of Session   Activity Tolerance: Patient tolerated treatment well Patient left: in bed;with call bell/phone within reach Nurse Communication: Mobility status PT Visit Diagnosis: Unsteadiness on feet (R26.81)     Time: 1030-1314 PT Time Calculation (min) (ACUTE ONLY): 16 min  Charges:  $Gait Training: 8-22 mins                     Drucilla Chalet, PT  Pager: [phone] Acute Rehab Dept [facility]): [phone]   06/22/2018    [facility] 06/22/2018, 10:58 AM

## 2018-06-22 NOTE — Evaluation (Signed)
Occupational Therapy Evaluation Patient Details Name: Wendy Summers MRN: [id number] DOB: 01-13-1951 Today's Date: 06/22/2018    History of Present Illness 68 y.o. female with a history of HTN, HLD, T2DM, BD-responsive restrictive lung disease, and PAF on eliquis who [facility] with nausea, vague chest pain, and feeling ill found to have AFib with rapid ventricular response.  confirmed to be covid positive, transferred to [facility] 4[facility]; pt husband is also at [facility]                Clinical Impression   Pt presents seated EOB just recently completed working with PT, agreeable to therapy session. Pt performing room level mobility without AD and minguard assist throughout. Pt on RA during session with SpO2 briefly down to 85% with functional activity, overall maintaining at 89-91%. Educated pt in use of AE for LB and toileting ADL to increase safety and independence with task. Pt return demonstrating with min cues throughout. Further reviewed energy conservation techniques and strategies to reduce risk for falls after return home. Pt verbalizing understanding. Will continue to follow while she remains in acute setting. Continue per POC.     Follow Up Recommendations  Home health OT;Supervision/Assistance - 24 hour    Equipment Recommendations  3 in 1 bedside commode;Tub/shower bench           Precautions / Restrictions Precautions Precautions: Fall Precaution Comments: monitor O2 adn HR Restrictions Weight Bearing Restrictions: No      Mobility Bed Mobility Overal bed mobility: Modified Independent             General bed mobility comments: increased effort to return to supine but no physical assist required  Transfers Overall transfer level: Needs assistance Equipment used: None Transfers: Sit to/from Stand Sit to Stand: Supervision;Modified independent (Device/Increase time)         General transfer comment: for safety, standing from toilet, and EOB    Balance  Overall balance assessment: Needs assistance   Sitting balance-Leahy Scale: Good       Standing balance-Leahy Scale: Fair Standing balance comment: use of single UE support with dynamic mobility                           ADL either performed or assessed with clinical judgement   ADL Overall ADL's : Needs assistance/impaired     Grooming: Wash/dry hands;Min guard;Standing         Lower Body Bathing Details (indicate cue type and reason): reviewed use of LH sponge for bathing task     Lower Body Dressing: Min guard;Minimal assistance;With adaptive equipment;Sit to/from stand Lower Body Dressing Details (indicate cue type and reason): educated in use of reacher and sock aide for LB dressing; pt return demonstrating understanding with min cues Toilet Transfer: Min guard;Ambulation   Toileting- Clothing Manipulation and Hygiene: Min guard;Sit to/from stand Toileting - Clothing Manipulation Details (indicate cue type and reason): after voiding bladder; pericare in standing with minguard for safety; educated in use of toilet aide for pericare after BM and pt verbalizing understanding, return demonstrates understanding of prep for use    Tub/Shower Transfer Details (indicate cue type and reason): educated pt in use of tub bench for tub/shower transfer - showed pt picture of setup of bench for further clarification with pt verbalizing understanding  Functional mobility during ADLs: Min guard General ADL Comments: educated pt in energy conservation strategies and strategies to reduce risk for falls with pt verbalizing  understanding, corresponding handout provided     Vision         Perception     Praxis      Pertinent Vitals/Pain Pain Assessment: No/denies pain     Hand Dominance     Extremity/Trunk Assessment             Communication     Cognition Arousal/Alertness: Awake/alert Behavior During Therapy: WFL for tasks assessed/performed Overall Cognitive  Status: Within Functional Limits for tasks assessed                                     General Comments  pt on RA with lowest SpO2 briefly down to 85% with activity; educated on use of O2 during functional tasks to increase endurance and overall respiratory status     Exercises General Exercises - Lower Extremity Ankle Circles/Pumps: AROM;Seated;10 reps Long Arc Quad: AROM;Strengthening;10 reps   Shoulder Instructions      Home Living                                          Prior Functioning/Environment                   OT Problem List:        OT Treatment/Interventions:      OT Goals(Current goals can be found in the care plan section) Acute Rehab OT Goals Patient Stated Goal: ready to go home to her dog OT Goal Formulation: With patient Time For Goal Achievement: 07/04/18 Potential to Achieve Goals: Good  OT Frequency: Min 3X/week   Barriers to D/C:            Co-evaluation              AM-PAC OT "6 Clicks" Daily Activity     Outcome Measure Help from another person eating meals?: None Help from another person taking care of personal grooming?: A Little Help from another person toileting, which includes using toliet, bedpan, or urinal?: A Little Help from another person bathing (including washing, rinsing, drying)?: A Little Help from another person to put on and taking off regular upper body clothing?: None Help from another person to put on and taking off regular lower body clothing?: A Little 6 Click Score: 20   End of Session Nurse Communication: Mobility status  Activity Tolerance: Patient tolerated treatment well Patient left: in bed;with call bell/phone within reach;with bed alarm set  OT Visit Diagnosis: Unsteadiness on feet (R26.81);Other abnormalities of gait and mobility (R26.89);Muscle weakness (generalized) (M62.81)                Time: 6861-6837 OT Time Calculation (min): 32 min Charges:  OT  General Charges $OT Visit: 1 Visit OT Treatments $Self Care/Home Management : 23-37 mins  Marcy Siren, OT Supplemental Rehabilitation Services Pager [phone] Office [phone]   Orlando Penner 06/22/2018, 11:36 AM

## 2018-06-22 NOTE — Care Management (Addendum)
Patient indicated she will need transportation home. CM arranged [facility] for 1400 with the requested information given to PTAR intake. CM is working remotely with a request for [employer] to print the Medical Necessity Form and Facesheet for [employer]. CM contacted [employer] liaison, Verlon Au, with an update provided on the changes. [employer] will deliver a portable oxygen regulator/cart and the [facility] to the patients home.   Colleen Can RN, BSN, NCM-BC, ACM-RN [phone]

## 2018-06-22 NOTE — Discharge Summary (Signed)
Wendy Summers Wendy Summers:[id number] DOB: 1951/02/01 DOA: 06/16/2018  [facility]  Admit date: 06/16/2018  Discharge date: 06/22/2018  Admitted From: Home  Disposition:  Home   Recommendations for Outpatient Follow-up:   Follow up with PCP in 1-2 weeks  PCP Please obtain BMP/CBC, 2 view CXR in 1week,  (see Discharge instructions)   PCP Please follow up on the following pending results:    Home Health: PT,RN   Equipment/Devices: 2lit o2  Consultations: None Discharge Condition: Stable   CODE STATUS: Full   Diet Recommendation: Heart Healthy Low Carb  Diet Order            Diet heart healthy/carb modified Room service appropriate? Yes; Fluid consistency: Thin; Fluid restriction: 1500 mL Fluid  Diet effective now               CC - SOB   Brief history of present illness from the day of admission and additional interim summary    Wendy Wendy J Smithis a 68 y.o.femalewith a history of HTN, HLD, T2DM, BD-responsive restrictive lung disease, and PAF on eliquis who [facility] ED [facility] with nausea, vague chest pain, and feeling ill found to have AFib with rapid ventricular response. She began feeling ill about 2 weeks prior with intermittent fevers and some nonproductive cough. Her husband is currently [facility] for covid-19 infection. When her symptoms worsened and included chest pain and wheezing she sought care, was given diltiazem gtt for rate control and confirmed to be covid positive, [facility].    Work-up suggested acute hypoxic respiratory failure due to combination of acute on chronic diastolic CHF, possible COVID-19 pneumonia, new onset A. fib RVR.                                                                 Hospital Course   1. Acute Hypoxic Resp. Failure due  to Acute Covid 19 Viral Illness during the ongoing 2020 Covid 19 Pandemic along with mild acute on chronic diastolic CHF with EF 55% because due to new onset A. fib RVR -  With supportive care, hypoxia and shortness of breath has considerably improved and she is currently between 1 and 1.5 L oxygen nasal cannula per minute without any increased work of breathing or distress.  There is some element of obesity related hypoventilation.    CHF is now back to baseline after diuresis with Lasix, she at this time will be discharged home on 2 L nasal cannula for now, request PCP to check CBC, CMP two-view chest x-ray in a week and also schedule outpatient sleep study for the patient.  2.  Acute on chronic diastolic CHF.  EF 55%.    Has been adequately diuresed and now compensated, continue home dose beta-blocker and  calcium channel blocker and follow with PCP in a week along with her primary cardiologist.     3.    Paroxysmal A. fib with RVR.  [place] vas 2 score of at least 3.  Currently on Eliquis, beta-blocker and Cardizem.  In good rate control now.  TSH was stable at 3.5.  4.  Morbid obesity.  BMI 42.  Follow with PCP for weight loss.  5.  Dyslipidemia.  On statin.  6.  Glaucoma.  Stable continue eyedrops.  7. DM2 -continue home regimen follow with PCP for glycemic control.  A1c was 7.5.   Discharge diagnosis     Principal Problem:   Acute respiratory failure with hypoxia [name]) Active Problems:   Asthmatic bronchitis   Atrial fibrillation with RVR (HCC)   Asthma   2019 novel coronavirus detected   T2DM (type 2 diabetes mellitus) (HCC)   HLD (hyperlipidemia)   HTN (hypertension)   Acute on chronic diastolic CHF (congestive heart failure) (HCC)   Obesity, Class III, BMI 40-49.9 (morbid obesity) [facility])    Discharge instructions    Discharge Instructions    Discharge instructions   Complete by:  As directed    Follow with [facility] in 7 days   Get  CBC, CMP, 2 view Chest X ray -  checked  by Primary MD or SNF MD in 5-7 days    Activity: As tolerated with Full fall precautions use walker/cane & assistance as needed  Disposition Home   Diet: Heart Healthy Low Carb, 1.5lit/day daily fluid restriction   Special Instructions: If you have smoked or chewed Tobacco  in the last 2 yrs please stop smoking, stop any regular Alcohol  and or any Recreational drug use.  On your next visit with your primary care physician please Get Medicines reviewed and adjusted.  Please request your Prim.MD to go over all Hospital Tests and Procedure/Radiological results at the follow up, please get all Hospital records sent to your Prim MD by signing hospital release before you go home.  If you experience worsening of your admission symptoms, develop shortness of breath, life threatening emergency, suicidal or homicidal thoughts you must seek medical attention immediately by calling 911 or calling your MD immediately  if symptoms less severe.  You Must read complete instructions/literature along with all the possible adverse reactions/side effects for all the Medicines you take and that have been prescribed to you. Take any new Medicines after you have completely understood and accpet all the possible adverse reactions/side effects.   Increase activity slowly   Complete by:  As directed       Discharge Medications   Allergies as of 06/22/2018   No Known Allergies     Medication List    TAKE these medications   albuterol (2.5 MG/3ML) 0.083% nebulizer solution Commonly known as:  PROVENTIL Take 2.5 mg by nebulization every 4 (four) hours as needed for wheezing or shortness of breath.   apixaban 5 MG Tabs tablet Commonly known as:  ELIQUIS Take 5 mg by mouth 2 (two) times daily.   atorvastatin 40 MG tablet Commonly known as:  LIPITOR Take 1 tablet (40 mg total) by mouth daily.   diltiazem 60 MG tablet Commonly known as:  CARDIZEM Take 1 tablet (60  mg total) by mouth every 8 (eight) hours.   metFORMIN 500 MG 24 hr tablet Commonly known as:  GLUCOPHAGE-XR Take 2,000 mg by mouth daily.   metoprolol tartrate 100 MG tablet Commonly  known as:  LOPRESSOR Take 100 mg by mouth 2 (two) times daily.   Simbrinza 1-0.2 % Susp Generic drug:  Brinzolamide-Brimonidine Apply 1 drop to eye 3 (three) times daily.            Durable Medical Equipment  (From admission, onward)         Start     Ordered   06/21/18 1625  For home use only DME 3 n 1  Once     06/21/18 1624   06/21/18 1625  For home use only DME Tub bench  Once     06/21/18 1624   06/21/18 1030  For home use only DME oxygen  Once    Question Answer Comment  Mode or (Route) Nasal cannula   Liters per Minute 2   Frequency Continuous (stationary and portable oxygen unit needed)   Oxygen conserving device Yes   Oxygen delivery system Gas      06/21/18 1030   06/21/18 1030  For home use only DME 4 wheeled rolling walker with seat  Once    Question:  Patient needs a walker to treat with the following condition  Answer:  Acute respiratory disease due to COVID-19 virus   06/21/18 1030          Follow-up Information    [facility] Follow up.   Specialty:  Home Health Services Why:  A representativr from [facility] will contact you to arrange start date and time for your therapy. Contact information: [address] [place] [address] [phone]        [facility] Follow up.   Why:  Home oxygen and rollator Contact information: [address] [place] [address] [phone]        [facility], [employer] Schedule an appointment as soon as possible for a visit in 1 week(s).   Why:  and your Cardiologist in 1 week Contact information: [address] [place] [address] [phone]           Major procedures and Radiology Reports - PLEASE review detailed and final reports thoroughly  -      Echo from  2018 reviewed.  EF 55%.  No acute abnormality.    Dg Chest Port 1 View  Result Date: 06/15/2018 CLINICAL DATA:  Nausea. EXAM: PORTABLE CHEST 1 VIEW COMPARISON:  03/08/2018 FINDINGS: 2038 hours. Patient rotated to the right. The cardio pericardial silhouette is enlarged. There is pulmonary vascular congestion without overt pulmonary edema. Probable interstitial pulmonary edema. No substantial pleural effusion. The visualized bony structures of the thorax are intact. Telemetry leads overlie the chest. IMPRESSION: Cardiomegaly with vascular congestion and probable interstitial edema. Electronically Signed   By: Kennith Center M.D.   On: 06/15/2018 21:28    Micro Results     Recent Results (from the past 240 hour(s))  SARS Coronavirus 2 [facility] order, Performed in [facility] hospital lab)     Status: Abnormal   Collection Time: 06/16/18  1:34 AM  Result Value Ref Range Status   SARS Coronavirus 2 POSITIVE (A) NEGATIVE Final    Comment: RESULT CALLED TO, READ BACK BY AND VERIFIED WITH: KAT MURRAY RN 06/16/2018 @ 0245 RDW (NOTE) If result is NEGATIVE SARS-CoV-2 target nucleic acids are NOT DETECTED. The SARS-CoV-2 RNA is generally detectable in upper and lower  respiratory specimens during the acute phase of infection. The lowest  concentration of SARS-CoV-2 viral copies this assay can detect is 250  copies / mL. A negative result does not preclude SARS-CoV-2 infection  and should not be used as the sole basis for treatment or other  patient management decisions.  A negative result may occur with  improper specimen collection / handling, submission of specimen other  than nasopharyngeal swab, presence of viral mutation(s) within the  areas targeted by this assay, and inadequate number of viral copies  (<250 copies / mL). A negative result must be combined with clinical  observations, patient history, and epidemiological information. If result is POSITIVE SARS-CoV-2 target nucleic acids  are DETECTED. T he SARS-CoV-2 RNA is generally detectable in upper and lower  respiratory specimens during the acute phase of infection.  Positive  results are indicative of active infection with SARS-CoV-2.  Clinical  correlation with patient history and other diagnostic information is  necessary to determine patient infection status.  Positive results do  not rule out bacterial infection or co-infection with other viruses. If result is PRESUMPTIVE POSTIVE SARS-CoV-2 nucleic acids MAY BE PRESENT.   A presumptive positive result was obtained on the submitted specimen  and confirmed on repeat testing.  While 2019 novel coronavirus  (SARS-CoV-2) nucleic acids may be present in the submitted sample  additional confirmatory testing may be necessary for epidemiological  and / or clinical management purposes  to differentiate between  SARS-CoV-2 and other Sarbecovirus currently known to infect humans.  If clinically indicated additional testing with an alternate test  methodology [phone]) is  advised. The SARS-CoV-2 RNA is generally  detectable in upper and lower respiratory specimens during the acute  phase of infection. The expected result is Negative. Fact Sheet for Patients:  BoilerBrush.com.cy Fact Sheet for Healthcare Providers: https://pope.com/ This test is not yet approved or cleared by the [place] FDA and has been authorized for detection and/or diagnosis of SARS-CoV-2 by FDA under an Emergency Use Authorization (EUA).  This EUA will remain in effect (meaning this test can be used) for the duration of the COVID-19 declaration under Section 564(b)(1) of the Act, 21 U.S.C. section 360bbb-3(b)(1), unless the authorization is terminated or revoked sooner. Performed at [facility], [address] [place], [place] [address]     Today   Subjective    Wendy Summers today has no headache,no chest abdominal pain,no  new weakness tingling or numbness, feels much better wants to go home today.     Objective   Blood pressure (!) 125/58, pulse 64, temperature 98.1 F (36.7 C), temperature source Oral, resp. rate 16, height  (1.575 m), weight 109 kg, SpO2 98 %.   Intake/Output Summary (Last 24 hours) at 06/22/2018 0918 Last data filed at 06/21/2018 1800 Gross per 24 hour  Intake 240 ml  Output 1100 ml  Net -860 ml    Exam  Awake Alert, Oriented x 3, No new F.N deficits, Normal affect [place].AT,PERRAL Supple Neck,No JVD, No cervical lymphadenopathy appriciated.  Symmetrical Chest wall movement, Good air movement bilaterally, CTAB RRR,No Gallops,Rubs or new Murmurs, No Parasternal Heave +ve B.Sounds, Abd Soft, Non tender, No organomegaly appriciated, No rebound -guarding or rigidity. No Cyanosis, Clubbing or edema, No new Rash or bruise   Data Review   CBC w Diff:  Lab Results  Component Value Date   WBC 8.1 06/21/2018   HGB 12.2 06/21/2018   HGB 13.1 10/01/2013   HCT 38.7 06/21/2018   HCT 39.8 10/01/2013   PLT 367 06/21/2018   PLT 268 10/01/2013   LYMPHOPCT 22 06/21/2018   LYMPHOPCT 25.4  10/01/2013   MONOPCT 9 06/21/2018   MONOPCT 7.2 10/01/2013   EOSPCT 5 06/21/2018   EOSPCT 5.0 10/01/2013   BASOPCT 1 06/21/2018   BASOPCT 0.8 10/01/2013    CMP:  Lab Results  Component Value Date   NA 140 06/22/2018   NA 138 10/01/2013   K 4.0 06/22/2018   K 4.3 03/06/2014   CL 102 06/22/2018   CL 106 10/01/2013   CO2 30 06/22/2018   CO2 22 10/01/2013   BUN 28 (H) 06/22/2018   BUN 12 10/01/2013   CREATININE 1.31 (H) 06/22/2018   CREATININE 1.08 10/01/2013   PROT 7.0 06/21/2018   PROT 7.7 01/31/2012   ALBUMIN 3.5 06/21/2018   ALBUMIN 3.4 01/31/2012   BILITOT 0.4 06/21/2018   BILITOT 0.4 01/31/2012   ALKPHOS 103 06/21/2018   ALKPHOS 147 (H) 01/31/2012   AST 17 06/21/2018   AST 119 (H) 01/31/2012   ALT 21 06/21/2018   ALT 117 (H) 01/31/2012  .   Total Time in preparing paper  work, data evaluation and todays exam - 35 minutes  Susa Raring M.D on 06/22/2018 at 9:18 AM  [facility]   Office  [phone]

## 2018-06-22 NOTE — Discharge Instructions (Signed)
Follow with [facility] in 7 days   Get CBC, CMP, 2 view Chest X ray -  checked  by [facility] or SNF MD in 5-7 days    Activity: As tolerated with Full fall precautions use walker/cane & assistance as needed  Disposition Home   Diet: Heart Healthy Low Carb, 1.5lit/day daily fluid restriction   Special Instructions: If you have smoked or chewed Tobacco  in the last 2 yrs please stop smoking, stop any regular Alcohol  and or any Recreational drug use.  On your next visit with your primary care physician please Get Medicines reviewed and adjusted.  Please request your Prim[facility]MD to go over all Hospital Tests and Procedure/Radiological results at the follow up, please get all Hospital records sent to your Prim MD by signing hospital release before you go home.  If you experience worsening of your admission symptoms, develop shortness of breath, life threatening emergency, suicidal or homicidal thoughts you must seek medical attention immediately by calling 911 or calling your MD immediately  if symptoms less severe.  You Must read complete instructions/literature along with all the possible adverse reactions/side effects for all the Medicines you take and that have been prescribed to you. Take any new Medicines after you have completely understood and accpet all the possible adverse reactions/side effects.            Person Under Monitoring Name: Wendy Summers  Location: [address] [place] [address]   Infection Prevention Recommendations for Individuals Confirmed to have, or Being Evaluated for, 2019 Novel Coronavirus (COVID-19) Infection Who Receive Care at Home  Individuals who are confirmed to have, or are being evaluated for, COVID-19 should follow the prevention steps below until a healthcare provider or local or state health department says they can return to normal activities.  Stay home except to get medical care You should restrict  activities outside your home, except for getting medical care. Do not go to work, school, or public areas, and do not use public transportation or taxis.  Call ahead before visiting your doctor Before your medical appointment, call the healthcare provider and tell them that you have, or are being evaluated for, COVID-19 infection. This will help the healthcare providers office take steps to keep other people from getting infected. Ask your healthcare provider to call the local or state health department.  Monitor your symptoms Seek prompt medical attention if your illness is worsening (e.g., difficulty breathing). Before going to your medical appointment, call the healthcare provider and tell them that you have, or are being evaluated for, COVID-19 infection. Ask your healthcare provider to call the local or state health department.  Wear a facemask You should wear a facemask that covers your nose and mouth when you are in the same room with other people and when you visit a healthcare provider. People who live with or visit you should also wear a facemask while they are in the same room with you.  Separate yourself from other people in your home As much as possible, you should stay in a different room from other people in your home. Also, you should use a separate bathroom, if available.  Avoid sharing household items You should not share dishes, drinking glasses, cups, eating utensils, towels, bedding, or other items with other people in your home. After using these items, you should wash them thoroughly with soap and water.  Cover your coughs and sneezes Cover your mouth and nose with  a tissue when you cough or sneeze, or you can cough or sneeze into your sleeve. Throw used tissues in a lined trash can, and immediately wash your hands with soap and water for at least 20 seconds or use an alcohol-based hand rub.  Wash your [facility] your hands often and thoroughly with soap and  water for at least 20 seconds. You can use an alcohol-based hand sanitizer if soap and water are not available and if your hands are not visibly dirty. Avoid touching your eyes, nose, and mouth with unwashed hands.   Prevention Steps for Caregivers and Household Members of Individuals Confirmed to have, or Being Evaluated for, COVID-19 Infection Being Cared for in the Home  If you live with, or provide care at home for, a person confirmed to have, or being evaluated for, COVID-19 infection please follow these guidelines to prevent infection:  Follow healthcare providers instructions Make sure that you understand and can help the patient follow any healthcare provider instructions for all care.  Provide for the patients basic needs You should help the patient with basic needs in the home and provide support for getting groceries, prescriptions, and other personal needs.  Monitor the patients symptoms If they are getting sicker, call his or her medical provider and tell them that the patient has, or is being evaluated for, COVID-19 infection. This will help the healthcare providers office take steps to keep other people from getting infected. Ask the healthcare provider to call the local or state health department.  Limit the number of people who have contact with the patient  If possible, have only one caregiver for the patient.  Other household members should stay in another home or place of residence. If this is not possible, they should stay  in another room, or be separated from the patient as much as possible. Use a separate bathroom, if available.  Restrict visitors who do not have an essential need to be in the home.  Keep older adults, very young children, and other sick people away from the patient Keep older adults, very young children, and those who have compromised immune systems or chronic health conditions away from the patient. This includes people with chronic  heart, lung, or kidney conditions, diabetes, and cancer.  Ensure good ventilation Make sure that shared spaces in the home have good air flow, such as from an air conditioner or an opened window, weather permitting.  Wash your hands often  Wash your hands often and thoroughly with soap and water for at least 20 seconds. You can use an alcohol based hand sanitizer if soap and water are not available and if your hands are not visibly dirty.  Avoid touching your eyes, nose, and mouth with unwashed hands.  Use disposable paper towels to dry your hands. If not available, use dedicated cloth towels and replace them when they become wet.  Wear a facemask and gloves  Wear a disposable facemask at all times in the room and gloves when you touch or have contact with the patients blood, body fluids, and/or secretions or excretions, such as sweat, saliva, sputum, nasal mucus, vomit, urine, or feces.  Ensure the mask fits over your nose and mouth tightly, and do not touch it during use.  Throw out disposable facemasks and gloves after using them. Do not reuse.  Wash your hands immediately after removing your facemask and gloves.  If your personal clothing becomes contaminated, carefully remove clothing and launder. Wash your hands after handling  contaminated clothing.  Place all used disposable facemasks, gloves, and other waste in a lined container before disposing them with other household waste.  Remove gloves and wash your hands immediately after handling these items.  Do not share dishes, glasses, or other household items with the patient  Avoid sharing household items. You should not share dishes, drinking glasses, cups, eating utensils, towels, bedding, or other items with a patient who is confirmed to have, or being evaluated for, COVID-19 infection.  After the person uses these items, you should wash them thoroughly with soap and water.  Wash laundry thoroughly  Immediately remove  and wash clothes or bedding that have blood, body fluids, and/or secretions or excretions, such as sweat, saliva, sputum, nasal mucus, vomit, urine, or feces, on them.  Wear gloves when handling laundry from the patient.  Read and follow directions on labels of laundry or clothing items and detergent. In general, wash and dry with the warmest temperatures recommended on the label.  Clean all areas the individual has used often  Clean all touchable surfaces, such as counters, tabletops, doorknobs, bathroom fixtures, toilets, phones, keyboards, tablets, and bedside tables, every day. Also, clean any surfaces that may have blood, body fluids, and/or secretions or excretions on them.  Wear gloves when cleaning surfaces the patient has come in contact with.  Use a diluted bleach solution (e.g., dilute bleach with 1 part bleach and 10 parts water) or a household disinfectant with a label that says EPA-registered for coronaviruses. To make a bleach solution at home, add 1 tablespoon of bleach to 1 quart (4 cups) of water. For a larger supply, add  cup of bleach to 1 gallon (16 cups) of water.  Read labels of cleaning products and follow recommendations provided on product labels. Labels contain instructions for safe and effective use of the cleaning product including precautions you should take when applying the product, such as wearing gloves or eye protection and making sure you have good ventilation during use of the product.  Remove gloves and wash hands immediately after cleaning.  Monitor yourself for signs and symptoms of illness Caregivers and household members are considered close contacts, should monitor their health, and will be asked to limit movement outside of the home to the extent possible. Follow the monitoring steps for close contacts listed on the symptom monitoring form.   ? If you have additional questions, contact your local health department or call the epidemiologist on call  at [phone] (available 24/7). ? This guidance is subject to change. For the most up-to-date guidance from [facility], please refer to their website: [email or web address].hu            Information on my medicine - ELIQUIS (apixaban)  This medication education was reviewed with me or my healthcare representative as part of my discharge preparation.  The pharmacist that spoke with me during my hospital stay was:  Ulyses Southward, RPH-CPP  Why was Eliquis prescribed for you? Eliquis was prescribed for you to reduce the risk of a blood clot forming that can cause a stroke if you have a medical condition called atrial fibrillation (a type of irregular heartbeat).  What do You need to know about Eliquis ? Take your Eliquis TWICE DAILY - one tablet in the morning and one tablet in the evening with or without food. If you have difficulty swallowing the tablet whole please discuss with your pharmacist how to take the medication safely.  Take Eliquis exactly as prescribed by your doctor and DO  NOT stop taking Eliquis without talking to the doctor who prescribed the medication.  Stopping may increase your risk of developing a stroke.  Refill your prescription before you run out.  After discharge, you should have regular check-up appointments with your healthcare provider that is prescribing your Eliquis.  In the future your dose may need to be changed if your kidney function or weight changes by a significant amount or as you get older.  What do you do if you miss a dose? If you miss a dose, take it as soon as you remember on the same day and resume taking twice daily.  Do not take more than one dose of ELIQUIS at the same time to make up a missed dose.  Important Safety Information A possible side effect of Eliquis is bleeding. You should call your healthcare provider right away if you experience any of the following: ? Bleeding from an injury  or your nose that does not stop. ? Unusual colored urine (red or dark brown) or unusual colored stools (red or black). ? Unusual bruising for unknown reasons. ? A serious fall or if you hit your head (even if there is no bleeding).  Some medicines may interact with Eliquis and might increase your risk of bleeding or clotting while on Eliquis. To help avoid this, consult your healthcare provider or pharmacist prior to using any new prescription or non-prescription medications, including herbals, vitamins, non-steroidal anti-inflammatory drugs (NSAIDs) and supplements.  This website has more information on Eliquis (apixaban): http://www.eliquis.com/eliquis/home

## 2018-06-22 NOTE — Progress Notes (Signed)
Patient stated that he husband said the oxygen has been delivered to the house. Charge nurse notified as well of this statement by patient.

## 2018-06-23 ENCOUNTER — Other Ambulatory Visit: Payer: Self-pay | Admitting: Obstetrics and Gynecology

## 2018-06-23 NOTE — Care Management (Signed)
ADDENDUM:  CM received a call from [facility] Department stating the patients spouse, Wendy Summers, reached out to them to request assistance with the patients home oxygen set-up which was serviced by [employer]. CM contacted Durward Fortes, [employer] liaison, to discuss the request. The [employer] liaison had an Chief Technology Officer to contact the patient/spouse to provide verbal instructions on how to set-up the home oxygen concentrator.   Colleen Can RN, BSN, NCM-BC, ACM-RN [phone]

## 2018-07-25 ENCOUNTER — Telehealth: Payer: Self-pay | Admitting: *Deleted

## 2018-07-25 NOTE — Telephone Encounter (Signed)
I called pt to request a plasma donation since she was positive with the COVID-19 last month.   I left a message on her machine to call us back if she was interested at [phone].

## 2018-09-28 ENCOUNTER — Other Ambulatory Visit: Payer: Self-pay | Admitting: Specialist

## 2018-09-28 DIAGNOSIS — R0602 Shortness of breath: Secondary | ICD-10-CM

## 2018-09-28 DIAGNOSIS — R0609 Other forms of dyspnea: Secondary | ICD-10-CM

## 2018-10-03 ENCOUNTER — Other Ambulatory Visit: Payer: Self-pay

## 2018-10-03 ENCOUNTER — Ambulatory Visit
Admission: RE | Admit: 2018-10-03 | Discharge: 2018-10-03 | Disposition: A | Discharge status: Home / Self Care | Payer: Medicaid Other | Source: Ambulatory Visit | Attending: Specialist | Admitting: Specialist

## 2018-10-03 DIAGNOSIS — R0609 Other forms of dyspnea: Secondary | ICD-10-CM | POA: Insufficient documentation

## 2018-10-03 DIAGNOSIS — R0602 Shortness of breath: Secondary | ICD-10-CM | POA: Insufficient documentation

## 2018-10-28 ENCOUNTER — Emergency Department
Admission: EM | Admit: 2018-10-28 | Discharge: 2018-10-28 | Disposition: A | Discharge status: Home / Self Care | Payer: Medicaid Other | Attending: Emergency Medicine | Admitting: Emergency Medicine

## 2018-10-28 ENCOUNTER — Encounter: Payer: Self-pay | Admitting: Emergency Medicine

## 2018-10-28 ENCOUNTER — Other Ambulatory Visit: Payer: Self-pay

## 2018-10-28 DIAGNOSIS — S50811A Abrasion of right forearm, initial encounter: Secondary | ICD-10-CM | POA: Diagnosis not present

## 2018-10-28 DIAGNOSIS — Y929 Unspecified place or not applicable: Secondary | ICD-10-CM | POA: Insufficient documentation

## 2018-10-28 DIAGNOSIS — Z7984 Long term (current) use of oral hypoglycemic drugs: Secondary | ICD-10-CM | POA: Insufficient documentation

## 2018-10-28 DIAGNOSIS — Y939 Activity, unspecified: Secondary | ICD-10-CM | POA: Insufficient documentation

## 2018-10-28 DIAGNOSIS — Z7901 Long term (current) use of anticoagulants: Secondary | ICD-10-CM | POA: Diagnosis not present

## 2018-10-28 DIAGNOSIS — I110 Hypertensive heart disease with heart failure: Secondary | ICD-10-CM | POA: Diagnosis not present

## 2018-10-28 DIAGNOSIS — W548XXA Other contact with dog, initial encounter: Secondary | ICD-10-CM | POA: Diagnosis not present

## 2018-10-28 DIAGNOSIS — I5032 Chronic diastolic (congestive) heart failure: Secondary | ICD-10-CM | POA: Insufficient documentation

## 2018-10-28 DIAGNOSIS — E119 Type 2 diabetes mellitus without complications: Secondary | ICD-10-CM | POA: Insufficient documentation

## 2018-10-28 DIAGNOSIS — J45909 Unspecified asthma, uncomplicated: Secondary | ICD-10-CM | POA: Insufficient documentation

## 2018-10-28 DIAGNOSIS — Y999 Unspecified external cause status: Secondary | ICD-10-CM | POA: Insufficient documentation

## 2018-10-28 DIAGNOSIS — Z79899 Other long term (current) drug therapy: Secondary | ICD-10-CM | POA: Insufficient documentation

## 2018-10-28 DIAGNOSIS — S59911A Unspecified injury of right forearm, initial encounter: Secondary | ICD-10-CM | POA: Diagnosis present

## 2018-10-28 MED ORDER — AMOXICILLIN-POT CLAVULANATE 875-125 MG PO TABS
1.0000 | ORAL_TABLET | Freq: Once | ORAL | Status: AC
  Administered 2018-10-28: 03:00:00 1 via ORAL
  Filled 2018-10-28: qty 1

## 2018-10-28 MED ORDER — BACITRACIN-NEOMYCIN-POLYMYXIN 400-5-5000 EX OINT
TOPICAL_OINTMENT | CUTANEOUS | Status: AC
  Filled 2018-10-28: qty 1

## 2018-10-28 MED ORDER — AMOXICILLIN-POT CLAVULANATE 875-125 MG PO TABS: 1.0000 | ORAL_TABLET | Freq: Two times a day (BID) | ORAL | 0 refills | Status: DC

## 2018-10-28 NOTE — ED Triage Notes (Signed)
Pt with scratch to right forearm from her sister-in-laws dog; no bleeding at this time; pt says dog is up to date on all of his shots; no swelling or redness to area;

## 2018-10-28 NOTE — ED Notes (Signed)
Patient presents to ED with scratch on right forearm from her dog. States it happened around 2100 last night. No bleeding at present and scab formation is present. Cleaned area with betadine. Will update tetanus as ordered.

## 2018-10-28 NOTE — ED Provider Notes (Signed)
[facility] Emergency Department Provider Note   ____________________________________________   First MD Initiated Contact with Patient 10/28/18 [phone]     (approximate)  I have reviewed the triage vital signs and the nursing notes.   HISTORY  Chief Complaint Animal Bite    HPI Wendy Summers is a 68 y.o. female who presents to the ED from home with a chief complaint of dog scratch to right forearm.  Dog's vaccinations are up-to-date.  Patient's tetanus is up-to-date.  Presents with abrasion to right forearm.  Voices no other complaints or injuries.        Past Medical History:  Diagnosis Date  . 2019 novel coronavirus detected 06/16/2018  . Asthma   . Bronchitis   . Diabetes mellitus without complication ([place])   . HLD (hyperlipidemia) 06/16/2018  . HTN (hypertension) 06/16/2018  . Migraine   . T2DM (type 2 diabetes mellitus) ([place]) 06/16/2018    Patient Active Problem List   Diagnosis Date Noted  . Acute respiratory failure with hypoxia ([place]) 06/17/2018  . Acute on chronic diastolic CHF (congestive heart failure) ([place]) 06/17/2018  . Obesity, Class III, BMI 40-49.9 (morbid obesity) ([place]) 06/17/2018  . Atrial flutter with rapid ventricular response ([place]) 06/16/2018  . 2019 novel coronavirus detected 06/16/2018  . T2DM (type 2 diabetes mellitus) ([place]) 06/16/2018  . HLD (hyperlipidemia) 06/16/2018  . HTN (hypertension) 06/16/2018  . Coronavirus infection 06/16/2018  . Atrial fibrillation with RVR ([place]) 06/15/2018  . Diabetes ([place]) 06/15/2018  . Asthma 06/15/2018  . Sepsis ([place]) 04/21/2018  . Asthmatic bronchitis 07/14/2016  . Near syncope 07/13/2016    Past Surgical History:  Procedure Laterality Date  . CHOLECYSTECTOMY    . NO PAST SURGERIES      Prior to Admission medications   Medication Sig Start Date End Date Taking? Authorizing Provider  albuterol (PROVENTIL) (2.5 MG/3ML) 0.083% nebulizer solution Take 2.5 mg by nebulization every 4  (four) hours as needed for wheezing or shortness of breath.   Yes [provider]  apixaban (ELIQUIS) 5 MG TABS tablet Take 5 mg by mouth 2 (two) times daily.    Yes [provider]  atorvastatin (LIPITOR) 40 MG tablet Take 1 tablet (40 mg total) by mouth daily. 06/16/18  Yes Mody, Ulice Bold, MD  diltiazem (CARDIZEM) 60 MG tablet Take 1 tablet (60 mg total) by mouth every 8 (eight) hours. 06/16/18  Yes Mody, Ulice Bold, MD  metFORMIN (GLUCOPHAGE-XR) 500 MG 24 hr tablet Take 2,000 mg by mouth daily. 01/18/18  Yes [provider]  metoprolol tartrate (LOPRESSOR) 100 MG tablet Take 100 mg by mouth 2 (two) times daily. 04/08/18  Yes [provider]  SIMBRINZA 1-0.2 % SUSP Apply 1 drop to eye 3 (three) times daily.  04/08/18  Yes [provider]  amoxicillin-clavulanate (AUGMENTIN) 875-125 MG tablet Take 1 tablet by mouth 2 (two) times daily. 10/28/18   Paulette Blanch, MD    Allergies Patient has no known allergies.  Family History  Problem Relation Age of Onset  . Diabetes Mellitus II Mother   . Pancreatic cancer Sister     Social History Social History   Tobacco Use  . Smoking status: Never Smoker  . Smokeless tobacco: Never Used  Substance Use Topics  . Alcohol use: No  . Drug use: No    Review of Systems  Constitutional: No fever/chills Eyes: No visual changes. ENT: No sore throat. Cardiovascular: Denies chest pain. Respiratory: Denies shortness of breath. Gastrointestinal: No abdominal pain.  No nausea, no vomiting.  No diarrhea.  No constipation. Genitourinary: Negative for dysuria. Musculoskeletal: Positive for right forearm abrasion.  Negative for back pain. Skin: Negative for rash. Neurological: Negative for headaches, focal weakness or numbness.   ____________________________________________   PHYSICAL EXAM:  VITAL SIGNS: ED Triage Vitals  Enc Vitals Group     BP 10/28/18 0058 (!) 159/85     Pulse Rate 10/28/18 0058 93     Resp  10/28/18 0058 18     Temp 10/28/18 0058 98.3 F (36.8 C)     Temp Source 10/28/18 0058 Oral     SpO2 10/28/18 0058 95 %     Weight 10/28/18 0058 227 lb (103 kg)     Height 10/28/18 0058 5\' 2"  (1.575 m)     Head Circumference --      Peak Flow --      Pain Score 10/28/18 0101 7     Pain Loc --      Pain Edu? --      Excl. in GC? --     Constitutional: Alert and oriented. Well appearing and in no acute distress. Eyes: Conjunctivae are normal. PERRL. EOMI. Head: Atraumatic. Nose: No congestion/rhinnorhea. Mouth/Throat: Mucous membranes are moist.  Oropharynx non-erythematous. Neck: No stridor.   Cardiovascular: Normal rate, regular rhythm. Grossly normal heart sounds.  Good peripheral circulation. Respiratory: Normal respiratory effort.  No retractions. Lungs CTAB. Gastrointestinal: Soft and nontender. No distention. No abdominal bruits. No CVA tenderness. Musculoskeletal: Right forearm abrasion.  There is no bite mark.  There is no puncture wound.  2+ radial pulse.  Brisk, less than 5-second capillary refill. No lower extremity tenderness nor edema.  No joint effusions. Neurologic:  Normal speech and language. No gross focal neurologic deficits are appreciated. No gait instability. Skin:  Skin is warm, dry and intact. No rash noted. Psychiatric: Mood and affect are normal. Speech and behavior are normal.  ____________________________________________   LABS (all labs ordered are listed, but only abnormal results are displayed)  Labs Reviewed - No data to display ____________________________________________  EKG  None ____________________________________________  RADIOLOGY  ED MD interpretation: None  Official radiology report(s): No results found.  ____________________________________________   PROCEDURES  Procedure(s) performed (including Critical Care):  Procedures   ____________________________________________   INITIAL IMPRESSION / ASSESSMENT AND PLAN /  ED COURSE  As part of my medical decision making, I reviewed the following data within the electronic MEDICAL RECORD NUMBER Nursing notes reviewed and incorporated, Old chart reviewed and Notes from prior ED visits     ANAYS DOWLER was evaluated in Emergency Department on 10/28/2018 for the symptoms described in the history of present illness. She was evaluated in the context of the global COVID-19 pandemic, which necessitated consideration that the patient might be at risk for infection with the SARS-CoV-2 virus that causes COVID-19. Institutional protocols and algorithms that pertain to the evaluation of patients at risk for COVID-19 are in a state of rapid change based on information released by regulatory bodies including the CDC and federal and state organizations. These policies and algorithms were followed during the patient's care in the ED.   68 year old female who presents with dog scratch to right forearm.  Patient is a diabetic.  Will start Augmentin and she will follow-up closely with her PCP.  Wound was cleaned and dressed by nursing staff.  Strict return precautions given.  Patient verbalizes understanding and agrees with plan of care.      ____________________________________________   FINAL  CLINICAL IMPRESSION(S) / ED DIAGNOSES  Final diagnoses:  Dog scratch     ED Discharge Orders         Ordered    amoxicillin-clavulanate (AUGMENTIN) 875-125 MG tablet  2 times daily     10/28/18 0312           Note:  This document was prepared using Dragon voice recognition software and may include unintentional dictation errors.   Irean HongSung, Pamela Maddy J, MD 10/28/18 [phone]

## 2018-10-28 NOTE — Discharge Instructions (Signed)
Take antibiotic as prescribed (Augmentin 875 mg twice daily x7 days).  Keep wound clean and dry. °Return to the ER for worsening symptoms, increased redness/swelling, purulent discharge or other concerns. °

## 2018-10-28 NOTE — ED Notes (Signed)
Neosporin applied to scratch on left arm and bandage to cover. Patient tolerated PO meds well. Will discharge when papers available.

## 2018-10-31 ENCOUNTER — Other Ambulatory Visit: Payer: Self-pay | Admitting: Obstetrics and Gynecology

## 2019-01-01 ENCOUNTER — Other Ambulatory Visit: Payer: Self-pay | Admitting: Obstetrics and Gynecology

## 2019-01-22 ENCOUNTER — Other Ambulatory Visit: Payer: Self-pay | Admitting: Internal Medicine

## 2019-01-22 DIAGNOSIS — I208 Other forms of angina pectoris: Secondary | ICD-10-CM

## 2019-02-12 ENCOUNTER — Other Ambulatory Visit: Payer: Self-pay

## 2019-02-12 ENCOUNTER — Encounter
Admission: RE | Admit: 2019-02-12 | Discharge: 2019-02-12 | Disposition: A | Discharge status: Home / Self Care | Payer: Medicaid Other | Source: Ambulatory Visit | Attending: Internal Medicine | Admitting: Internal Medicine

## 2019-02-12 DIAGNOSIS — I208 Other forms of angina pectoris: Secondary | ICD-10-CM | POA: Diagnosis present

## 2019-02-12 MED ORDER — TECHNETIUM TC 99M TETROFOSMIN IV KIT
10.0000 | PACK | Freq: Once | INTRAVENOUS | Status: AC | PRN
  Administered 2019-02-12: 11.13 via INTRAVENOUS

## 2019-02-12 MED ORDER — TECHNETIUM TC 99M TETROFOSMIN IV KIT
32.1030 | PACK | Freq: Once | INTRAVENOUS | Status: AC | PRN
  Administered 2019-02-12: 32.103 via INTRAVENOUS

## 2019-02-12 MED ORDER — REGADENOSON 0.4 MG/5ML IV SOLN
0.4000 mg | Freq: Once | INTRAVENOUS | Status: AC
  Administered 2019-02-12: 0.4 mg via INTRAVENOUS

## 2019-02-26 LAB — NM MYOCAR MULTI W/SPECT W/WALL MOTION / EF
Estimated workload: 1 METS
Exercise duration (min): 1 min
Exercise duration (sec): 0 s
LV dias vol: 57 mL (ref 46–106)
LV sys vol: 16 mL
Peak HR: 109 {beats}/min
Rest HR: 89 {beats}/min
SDS: 5
SRS: 3
SSS: 4
TID: 1.17

## 2019-03-02 ENCOUNTER — Observation Stay
Admission: EM | Admit: 2019-03-02 | Discharge: 2019-03-03 | Disposition: A | Discharge status: Home / Self Care | Payer: Medicaid Other | Attending: Internal Medicine | Admitting: Internal Medicine

## 2019-03-02 ENCOUNTER — Emergency Department: Payer: Medicaid Other

## 2019-03-02 ENCOUNTER — Other Ambulatory Visit: Payer: Self-pay

## 2019-03-02 ENCOUNTER — Encounter: Payer: Self-pay | Admitting: Radiology

## 2019-03-02 DIAGNOSIS — E119 Type 2 diabetes mellitus without complications: Secondary | ICD-10-CM | POA: Insufficient documentation

## 2019-03-02 DIAGNOSIS — I110 Hypertensive heart disease with heart failure: Secondary | ICD-10-CM | POA: Diagnosis not present

## 2019-03-02 DIAGNOSIS — Z8616 Personal history of COVID-19: Secondary | ICD-10-CM | POA: Diagnosis not present

## 2019-03-02 DIAGNOSIS — J45909 Unspecified asthma, uncomplicated: Secondary | ICD-10-CM | POA: Diagnosis not present

## 2019-03-02 DIAGNOSIS — I5033 Acute on chronic diastolic (congestive) heart failure: Secondary | ICD-10-CM | POA: Diagnosis not present

## 2019-03-02 DIAGNOSIS — Z20822 Contact with and (suspected) exposure to covid-19: Secondary | ICD-10-CM | POA: Insufficient documentation

## 2019-03-02 DIAGNOSIS — I472 Ventricular tachycardia: Principal | ICD-10-CM | POA: Insufficient documentation

## 2019-03-02 DIAGNOSIS — I4891 Unspecified atrial fibrillation: Secondary | ICD-10-CM

## 2019-03-02 DIAGNOSIS — E785 Hyperlipidemia, unspecified: Secondary | ICD-10-CM | POA: Diagnosis not present

## 2019-03-02 DIAGNOSIS — I4729 Other ventricular tachycardia: Secondary | ICD-10-CM

## 2019-03-02 DIAGNOSIS — Z7901 Long term (current) use of anticoagulants: Secondary | ICD-10-CM | POA: Insufficient documentation

## 2019-03-02 DIAGNOSIS — Z79899 Other long term (current) drug therapy: Secondary | ICD-10-CM | POA: Insufficient documentation

## 2019-03-02 DIAGNOSIS — Z7984 Long term (current) use of oral hypoglycemic drugs: Secondary | ICD-10-CM | POA: Diagnosis not present

## 2019-03-02 DIAGNOSIS — R079 Chest pain, unspecified: Secondary | ICD-10-CM | POA: Insufficient documentation

## 2019-03-02 DIAGNOSIS — I10 Essential (primary) hypertension: Secondary | ICD-10-CM

## 2019-03-02 LAB — CBC WITH DIFFERENTIAL/PLATELET
Abs Immature Granulocytes: 0.05 K/uL (ref 0.00–0.07)
Basophils Absolute: 0.1 K/uL (ref 0.0–0.1)
Basophils Relative: 1 %
Eosinophils Absolute: 0.5 K/uL (ref 0.0–0.5)
Eosinophils Relative: 3 %
HCT: 46.8 % — ABNORMAL HIGH (ref 36.0–46.0)
Hemoglobin: 14.5 g/dL (ref 12.0–15.0)
Immature Granulocytes: 0 %
Lymphocytes Relative: 21 %
Lymphs Abs: 3 K/uL (ref 0.7–4.0)
MCH: 29.8 pg (ref 26.0–34.0)
MCHC: 31 g/dL (ref 30.0–36.0)
MCV: 96.1 fL (ref 80.0–100.0)
Monocytes Absolute: 1 K/uL (ref 0.1–1.0)
Monocytes Relative: 7 %
Neutro Abs: 9.6 K/uL — ABNORMAL HIGH (ref 1.7–7.7)
Neutrophils Relative %: 68 %
Platelets: 349 K/uL (ref 150–400)
RBC: 4.87 MIL/uL (ref 3.87–5.11)
RDW: 12.8 % (ref 11.5–15.5)
WBC: 14.1 K/uL — ABNORMAL HIGH (ref 4.0–10.5)
nRBC: 0 % (ref 0.0–0.2)

## 2019-03-02 LAB — TROPONIN I (HIGH SENSITIVITY): Troponin I (High Sensitivity): 22 ng/L — ABNORMAL HIGH

## 2019-03-02 LAB — COMPREHENSIVE METABOLIC PANEL
ALT: 27 U/L (ref 0–44)
AST: 31 U/L (ref 15–41)
Albumin: 4 g/dL (ref 3.5–5.0)
Alkaline Phosphatase: 99 U/L (ref 38–126)
Anion gap: 15 (ref 5–15)
BUN: 28 mg/dL — ABNORMAL HIGH (ref 8–23)
CO2: 22 mmol/L (ref 22–32)
Calcium: 9.8 mg/dL (ref 8.9–10.3)
Chloride: 101 mmol/L (ref 98–111)
Creatinine, Ser: 1.39 mg/dL — ABNORMAL HIGH (ref 0.44–1.00)
GFR calc Af Amer: 45 mL/min — ABNORMAL LOW (ref >60)
GFR calc non Af Amer: 39 mL/min — ABNORMAL LOW (ref >60)
Glucose, Bld: 329 mg/dL — ABNORMAL HIGH (ref 70–99)
Potassium: 4.3 mmol/L (ref 3.5–5.1)
Sodium: 138 mmol/L (ref 135–145)
Total Bilirubin: 0.4 mg/dL (ref 0.3–1.2)
Total Protein: 7.9 g/dL (ref 6.5–8.1)

## 2019-03-02 LAB — MAGNESIUM: Magnesium: 2 mg/dL (ref 1.7–2.4)

## 2019-03-02 LAB — BRAIN NATRIURETIC PEPTIDE: B Natriuretic Peptide: 61 pg/mL (ref 0.0–100.0)

## 2019-03-02 LAB — PHOSPHORUS: Phosphorus: 3.5 mg/dL (ref 2.5–4.6)

## 2019-03-02 MED ORDER — AMIODARONE IV BOLUS ONLY 150 MG/100ML
150.0000 mg | Freq: Once | INTRAVENOUS | Status: AC
  Administered 2019-03-02: 150 mg via INTRAVENOUS

## 2019-03-02 MED ORDER — ETOMIDATE 2 MG/ML IV SOLN: 10.0000 mg | Freq: Once | INTRAVENOUS | Status: DC

## 2019-03-02 MED ORDER — IOHEXOL 350 MG/ML SOLN
60.0000 mL | Freq: Once | INTRAVENOUS | Status: AC | PRN
  Administered 2019-03-02: 75 mL via INTRAVENOUS

## 2019-03-02 MED ORDER — DILTIAZEM HCL 25 MG/5ML IV SOLN
INTRAVENOUS | Status: AC
  Filled 2019-03-02: qty 5

## 2019-03-02 MED ORDER — AMIODARONE LOAD VIA INFUSION
150.0000 mg | Freq: Once | INTRAVENOUS | Status: AC
  Administered 2019-03-02: 150 mg via INTRAVENOUS

## 2019-03-02 MED ORDER — SODIUM CHLORIDE 0.9 % IV BOLUS
1000.0000 mL | Freq: Once | INTRAVENOUS | Status: AC
  Administered 2019-03-02: 1000 mL via INTRAVENOUS

## 2019-03-02 MED ORDER — DILTIAZEM HCL 25 MG/5ML IV SOLN: 10.0000 mg | Freq: Once | INTRAVENOUS | Status: DC

## 2019-03-02 NOTE — ED Provider Notes (Addendum)
[facility] Emergency Department Provider Note  ____________________________________________   First MD Initiated Contact with Patient 03/02/19 2106     (approximate)  I have reviewed the triage vital signs and the nursing notes.   HISTORY  Chief Complaint Chest Pain    HPI Wendy Summers is a 69 y.o. female  With h.o HTN, HLD, DM, here with CP, SOB, tachycardia. Pt reports she had generally felt well until this afternoon. She was resting when she experienced acute onset of an aching but mildly sharp substernal CP, followed by sensation of palpitations, SOB, and lightheadedness. The pain has been constant and worsening since then and she's now developed associated sensation like she's going to pass out. She reports she does have a h/o "bad heart" but does not recall what, specifically. Does note she is on a blood thinner. No recent fever, chills, or COVID exposures. No recent med changes.        Past Medical History:  Diagnosis Date  . 2019 novel coronavirus detected 06/16/2018  . Asthma   . Bronchitis   . Diabetes mellitus without complication (HCC)   . HLD (hyperlipidemia) 06/16/2018  . HTN (hypertension) 06/16/2018  . Migraine   . T2DM (type 2 diabetes mellitus) (HCC) 06/16/2018    Patient Active Problem List   Diagnosis Date Noted  . Acute respiratory failure with hypoxia (HCC) 06/17/2018  . Acute on chronic diastolic CHF (congestive heart failure) (HCC) 06/17/2018  . Obesity, Class III, BMI 40-49.9 (morbid obesity) (HCC) 06/17/2018  . Atrial flutter with rapid ventricular response (HCC) 06/16/2018  . 2019 novel coronavirus detected 06/16/2018  . T2DM (type 2 diabetes mellitus) (HCC) 06/16/2018  . HLD (hyperlipidemia) 06/16/2018  . HTN (hypertension) 06/16/2018  . Coronavirus infection 06/16/2018  . Atrial fibrillation with RVR (HCC) 06/15/2018  . Diabetes (HCC) 06/15/2018  . Asthma 06/15/2018  . Sepsis (HCC) 04/21/2018  . Asthmatic  bronchitis 07/14/2016  . Near syncope 07/13/2016    Past Surgical History:  Procedure Laterality Date  . CHOLECYSTECTOMY    . NO PAST SURGERIES      Prior to Admission medications   Medication Sig Start Date End Date Taking? Authorizing Provider  albuterol (PROVENTIL) (2.5 MG/3ML) 0.083% nebulizer solution Take 2.5 mg by nebulization every 4 (four) hours as needed for wheezing or shortness of breath.    [provider]  amLODipine (NORVASC) 10 MG tablet Take 10 mg by mouth daily. 01/01/19   [provider]  amoxicillin-clavulanate (AUGMENTIN) 875-125 MG tablet Take 1 tablet by mouth 2 (two) times daily. 10/28/18   Irean Hong, MD  apixaban (ELIQUIS) 5 MG TABS tablet Take 5 mg by mouth 2 (two) times daily.     [provider]  atorvastatin (LIPITOR) 40 MG tablet Take 1 tablet (40 mg total) by mouth daily. 06/16/18   Adrian Saran, MD  diltiazem (CARDIZEM) 60 MG tablet Take 1 tablet (60 mg total) by mouth every 8 (eight) hours. 06/16/18   Adrian Saran, MD  glipiZIDE (GLUCOTROL) 10 MG tablet Take 10 mg by mouth daily. 01/01/19   [provider]  lisinopril (ZESTRIL) 20 MG tablet Take 20 mg by mouth daily. 01/01/19   [provider]  metFORMIN (GLUCOPHAGE-XR) 500 MG 24 hr tablet Take 2,000 mg by mouth daily. 01/18/18   [provider]  metoprolol tartrate (LOPRESSOR) 100 MG tablet Take 100 mg by mouth 2 (two) times daily. 04/08/18   [provider]  SIMBRINZA 1-0.2 % SUSP Apply 1 drop  to eye 3 (three) times daily.  04/08/18   [provider]    Allergies Patient has no known allergies.  Family History  Problem Relation Age of Onset  . Diabetes Mellitus II Mother   . Pancreatic cancer Sister     Social History Social History   Tobacco Use  . Smoking status: Never Smoker  . Smokeless tobacco: Never Used  Substance Use Topics  . Alcohol use: No  . Drug use: No    Review of Systems  Review of Systems  Constitutional:  Positive for fatigue. Negative for fever.  HENT: Negative for congestion and sore throat.   Eyes: Negative for visual disturbance.  Respiratory: Positive for chest tightness and shortness of breath. Negative for cough.   Cardiovascular: Positive for chest pain.  Gastrointestinal: Negative for abdominal pain, diarrhea, nausea and vomiting.  Genitourinary: Negative for flank pain.  Musculoskeletal: Negative for back pain and neck pain.  Skin: Negative for rash and wound.  Neurological: Positive for weakness and light-headedness.  All other systems reviewed and are negative.    ____________________________________________  PHYSICAL EXAM:      VITAL SIGNS: ED Triage Vitals  Enc Vitals Group     BP 03/02/19 2054 135/78     Pulse Rate 03/02/19 2054 (!) 220     Resp 03/02/19 2054 20     Temp --      Temp Source 03/02/19 2054 Oral     SpO2 03/02/19 2054 96 %     Weight 03/02/19 2052 240 lb (108.9 kg)     Height 03/02/19 2052 5\' 2"  (1.575 m)     Head Circumference --      Peak Flow --      Pain Score 03/02/19 2047 7     Pain Loc --      Pain Edu? --      Excl. in GC? --      Physical Exam Vitals and nursing note reviewed.  Constitutional:      General: She is in acute distress.     Appearance: She is well-developed. She is diaphoretic.  HENT:     Head: Normocephalic and atraumatic.  Eyes:     Conjunctiva/sclera: Conjunctivae normal.  Cardiovascular:     Rate and Rhythm: Regular rhythm. Tachycardia present.     Heart sounds: Normal heart sounds.  Pulmonary:     Effort: Pulmonary effort is normal. Tachypnea present. No respiratory distress.     Breath sounds: No wheezing.  Abdominal:     General: There is no distension.  Musculoskeletal:     Cervical back: Neck supple.  Skin:    General: Skin is warm.     Capillary Refill: Capillary refill takes less than 2 seconds.     Findings: No rash.  Neurological:     Mental Status: She is alert and oriented to person, place,  and time.     Motor: No abnormal muscle tone.       ____________________________________________   LABS (all labs ordered are listed, but only abnormal results are displayed)  Labs Reviewed  CBC WITH DIFFERENTIAL/PLATELET - Abnormal; Notable for the following components:      Result Value   WBC 14.1 (*)    HCT 46.8 (*)    Neutro Abs 9.6 (*)    All other components within normal limits  COMPREHENSIVE METABOLIC PANEL - Abnormal; Notable for the following components:   Glucose, Bld 329 (*)    BUN 28 (*)    Creatinine,  Ser 1.39 (*)    GFR calc non Af Amer 39 (*)    GFR calc Af Amer 45 (*)    All other components within normal limits  TROPONIN I (HIGH SENSITIVITY) - Abnormal; Notable for the following components:   Troponin I (High Sensitivity) 22 (*)    All other components within normal limits  MAGNESIUM  PHOSPHORUS  BRAIN NATRIURETIC PEPTIDE  TROPONIN I (HIGH SENSITIVITY)    ____________________________________________  EKG: SVT versus AFib with RVR. VR 220. QRS 74. QTc 409. Non-specific ST changes, likely reate related.  EKG 2: Normal sinus rhythm has replaced AFib with RVR. Non-specific ST changes resolved. Left axis deviation. Abnormal R wave progression. CR 120, QRS 91, Qtc 431. ________________________________________  RADIOLOGY All imaging, including plain films, CT scans, and ultrasounds, independently reviewed by me, and interpretations confirmed via formal radiology reads.  ED MD interpretation:   CT Angio: Pending  Official radiology report(s): No results found.  ____________________________________________  PROCEDURES   Procedure(s) performed (including Critical Care):  .Critical Care Performed by: Duffy Bruce, MD Authorized by: Duffy Bruce, MD   Critical care provider statement:    Critical care time (minutes):  35   Critical care time was exclusive of:  Separately billable procedures and treating other patients and teaching time    Critical care was necessary to treat or prevent imminent or life-threatening deterioration of the following conditions:  Cardiac failure, circulatory failure and respiratory failure   Critical care was time spent personally by me on the following activities:  Development of treatment plan with patient or surrogate, discussions with consultants, evaluation of patient's response to treatment, examination of patient, obtaining history from patient or surrogate, ordering and performing treatments and interventions, ordering and review of laboratory studies, ordering and review of radiographic studies, pulse oximetry, re-evaluation of patient's condition and review of old charts   I assumed direction of critical care for this patient from another provider in my specialty: no      ____________________________________________  INITIAL IMPRESSION / MDM / [place] / ED COURSE  As part of my medical decision making, I reviewed the following data within the [place] notes reviewed and incorporated, Old chart reviewed, Notes from prior ED visits, and [place] Controlled Substance Database       *KELIAH HARNED was evaluated in Emergency Department on 03/02/2019 for the symptoms described in the history of present illness. She was evaluated in the context of the global COVID-19 pandemic, which necessitated consideration that the patient might be at risk for infection with the SARS-CoV-2 virus that causes COVID-19. Institutional protocols and algorithms that pertain to the evaluation of patients at risk for COVID-19 are in a state of rapid change based on information released by regulatory bodies including the CDC and federal and state organizations. These policies and algorithms were followed during the patient's care in the ED.  Some ED evaluations and interventions may be delayed as a result of limited staffing during the pandemic.*    Medical Decision Making:  69 yo F here with  what I suspect is AFib w/ RVR vs SVT vs less likely VTach. Initially, pt with HR 220, wide complex, no reported h/o AFib so Amio given 150 mg x 2 with return or NSR. She has been compliant with blood thinners on further questioning, and onset was <12 hr ago. Labs with likely reactive leukocytosis, otherwise largely unremarkable. Trop 22 - likely 2/2 demand, no ST elevations on repeat EKG which  shows NSR.  Given her ongoing CP despite conversion to NSR, suspect pt will need to be observed given her initial presentation w/ HR 220s as well as mild trop elevation. Plan to obtain CT Angio given the degree of her pain and acuity of onset of sx.   ____________________________________________  FINAL CLINICAL IMPRESSION(S) / ED DIAGNOSES  Final diagnoses:  Atrial fibrillation with rapid ventricular response (HCC)     MEDICATIONS GIVEN DURING THIS VISIT:  Medications  etomidate (AMIDATE) injection 10 mg (has no administration in time range)  diltiazem (CARDIZEM) injection 10 mg (has no administration in time range)  iohexol (OMNIPAQUE) 350 MG/ML injection 60 mL (has no administration in time range)  sodium chloride 0.9 % bolus 1,000 mL (1,000 mLs Intravenous New Bag/Given 03/02/19 2126)  amiodarone (NEXTERONE) 1.8 mg/mL load via infusion 150 mg (150 mg Intravenous Bolus from Bag 03/02/19 2130)  amiodarone (NEXTERONE) IV bolus only 150 mg/100 mL (0 mg Intravenous Stopped 03/02/19 2130)     ED Discharge Orders    None       Note:  This document was prepared using Dragon voice recognition software and may include unintentional dictation errors.   Shaune Pollack, MD 03/02/19 1017    Shaune Pollack, MD 03/02/19 [phone]

## 2019-03-02 NOTE — ED Notes (Signed)
Pt states breathing is better but is still having right sided chest pain. Admitting provider at bedside.

## 2019-03-02 NOTE — ED Notes (Addendum)
Pt converted SR. HR 128. Md at bedside.

## 2019-03-02 NOTE — ED Notes (Signed)
MD at bedside. 

## 2019-03-02 NOTE — ED Triage Notes (Signed)
Patient reports chest pain that started this evening at approximately 6:30 pm.  Pain described as sharpe with sweating.

## 2019-03-02 NOTE — ED Notes (Signed)
Second dose of 150mg  amio finished.

## 2019-03-03 ENCOUNTER — Observation Stay
Admit: 2019-03-03 | Discharge: 2019-03-03 | Disposition: A | Discharge status: Home / Self Care | Payer: Medicaid Other | Attending: Family Medicine | Admitting: Family Medicine

## 2019-03-03 DIAGNOSIS — I4891 Unspecified atrial fibrillation: Secondary | ICD-10-CM

## 2019-03-03 DIAGNOSIS — R079 Chest pain, unspecified: Secondary | ICD-10-CM | POA: Diagnosis not present

## 2019-03-03 DIAGNOSIS — I4729 Other ventricular tachycardia: Secondary | ICD-10-CM

## 2019-03-03 DIAGNOSIS — I10 Essential (primary) hypertension: Secondary | ICD-10-CM | POA: Diagnosis not present

## 2019-03-03 DIAGNOSIS — E119 Type 2 diabetes mellitus without complications: Secondary | ICD-10-CM

## 2019-03-03 DIAGNOSIS — E1165 Type 2 diabetes mellitus with hyperglycemia: Secondary | ICD-10-CM | POA: Insufficient documentation

## 2019-03-03 DIAGNOSIS — I472 Ventricular tachycardia: Secondary | ICD-10-CM

## 2019-03-03 LAB — APTT
aPTT: 32 seconds (ref 24–36)
aPTT: 60 seconds — ABNORMAL HIGH (ref 24–36)

## 2019-03-03 LAB — RESPIRATORY PANEL BY RT PCR (FLU A&B, COVID)
Influenza A by PCR: NEGATIVE
Influenza B by PCR: NEGATIVE
SARS Coronavirus 2 by RT PCR: NEGATIVE

## 2019-03-03 LAB — TROPONIN I (HIGH SENSITIVITY)
Troponin I (High Sensitivity): 68 ng/L — ABNORMAL HIGH (ref <18)
Troponin I (High Sensitivity): 95 ng/L — ABNORMAL HIGH (ref <18)
Troponin I (High Sensitivity): 63 ng/L — ABNORMAL HIGH (ref <18)

## 2019-03-03 LAB — LIPID PANEL
Cholesterol: 247 mg/dL — ABNORMAL HIGH (ref 0–200)
HDL: 63 mg/dL (ref >40)
LDL Cholesterol: 118 mg/dL — ABNORMAL HIGH (ref 0–99)
Total CHOL/HDL Ratio: 3.9 RATIO
Triglycerides: 328 mg/dL — ABNORMAL HIGH (ref <150)
VLDL: 66 mg/dL — ABNORMAL HIGH (ref 0–40)

## 2019-03-03 LAB — HEMOGLOBIN A1C
Hgb A1c MFr Bld: 8 % — ABNORMAL HIGH (ref 4.8–5.6)
Mean Plasma Glucose: 182.9 mg/dL

## 2019-03-03 LAB — HEPARIN LEVEL (UNFRACTIONATED): Heparin Unfractionated: 1.54 IU/mL — ABNORMAL HIGH (ref 0.30–0.70)

## 2019-03-03 LAB — ECHOCARDIOGRAM COMPLETE
Height: 62 in
Weight: 3840 oz

## 2019-03-03 LAB — PROTIME-INR
INR: 0.9 (ref 0.8–1.2)
Prothrombin Time: 12.3 seconds (ref 11.4–15.2)

## 2019-03-03 LAB — GLUCOSE, CAPILLARY: Glucose-Capillary: 226 mg/dL — ABNORMAL HIGH (ref 70–99)

## 2019-03-03 MED ORDER — LIDOCAINE VISCOUS HCL 2 % MT SOLN
15.0000 mL | Freq: Once | OROMUCOSAL | Status: AC
  Administered 2019-03-03: 15 mL via ORAL
  Filled 2019-03-03: qty 15

## 2019-03-03 MED ORDER — HEPARIN (PORCINE) 25000 UT/250ML-% IV SOLN
1000.0000 [IU]/h | INTRAVENOUS | Status: DC
  Administered 2019-03-03: 1000 [IU]/h via INTRAVENOUS
  Filled 2019-03-03: qty 250

## 2019-03-03 MED ORDER — ATORVASTATIN CALCIUM 20 MG PO TABS: 40.0000 mg | ORAL_TABLET | Freq: Every day | ORAL | Status: DC

## 2019-03-03 MED ORDER — ALPRAZOLAM 0.5 MG PO TABS: 0.2500 mg | ORAL_TABLET | Freq: Two times a day (BID) | ORAL | Status: DC | PRN

## 2019-03-03 MED ORDER — DILTIAZEM HCL 60 MG PO TABS: 60.0000 mg | ORAL_TABLET | Freq: Three times a day (TID) | ORAL | Status: DC

## 2019-03-03 MED ORDER — GLIPIZIDE 10 MG PO TABS: 10.0000 mg | ORAL_TABLET | Freq: Every day | ORAL | Status: DC

## 2019-03-03 MED ORDER — ACETAMINOPHEN 325 MG PO TABS: 650.0000 mg | ORAL_TABLET | ORAL | Status: DC | PRN

## 2019-03-03 MED ORDER — ZOLPIDEM TARTRATE 5 MG PO TABS: 5.0000 mg | ORAL_TABLET | Freq: Every evening | ORAL | Status: DC | PRN

## 2019-03-03 MED ORDER — METOPROLOL TARTRATE 50 MG PO TABS: 100.0000 mg | ORAL_TABLET | Freq: Two times a day (BID) | ORAL | Status: DC

## 2019-03-03 MED ORDER — METOPROLOL TARTRATE 25 MG PO TABS
25.0000 mg | ORAL_TABLET | Freq: Once | ORAL | Status: AC
  Administered 2019-03-03: 25 mg via ORAL
  Filled 2019-03-03: qty 1

## 2019-03-03 MED ORDER — SODIUM CHLORIDE 0.9 % IV SOLN: INTRAVENOUS | Status: DC

## 2019-03-03 MED ORDER — AMIODARONE HCL 200 MG PO TABS
200.0000 mg | ORAL_TABLET | Freq: Two times a day (BID) | ORAL | Status: DC
  Administered 2019-03-03: 200 mg via ORAL
  Filled 2019-03-03 (×2): qty 1

## 2019-03-03 MED ORDER — AMIODARONE HCL IN DEXTROSE 360-4.14 MG/200ML-% IV SOLN
60.0000 mg/h | INTRAVENOUS | Status: DC
  Administered 2019-03-03: 07:00:00 60 mg/h via INTRAVENOUS

## 2019-03-03 MED ORDER — AMLODIPINE BESYLATE 5 MG PO TABS
10.0000 mg | ORAL_TABLET | Freq: Every day | ORAL | Status: DC
  Administered 2019-03-03: 10 mg via ORAL
  Filled 2019-03-03: qty 2

## 2019-03-03 MED ORDER — AMIODARONE HCL 200 MG PO TABS: 200.0000 mg | ORAL_TABLET | Freq: Two times a day (BID) | ORAL | 0 refills | Status: AC

## 2019-03-03 MED ORDER — NITROGLYCERIN 0.4 MG SL SUBL: 0.4000 mg | SUBLINGUAL_TABLET | SUBLINGUAL | Status: DC | PRN

## 2019-03-03 MED ORDER — INSULIN ASPART 100 UNIT/ML ~~LOC~~ SOLN: 0.0000 [IU] | Freq: Four times a day (QID) | SUBCUTANEOUS | Status: DC

## 2019-03-03 MED ORDER — LISINOPRIL 10 MG PO TABS: 20.0000 mg | ORAL_TABLET | Freq: Every day | ORAL | Status: DC

## 2019-03-03 MED ORDER — ONDANSETRON HCL 4 MG/2ML IJ SOLN: 4.0000 mg | Freq: Four times a day (QID) | INTRAMUSCULAR | Status: DC | PRN

## 2019-03-03 MED ORDER — MORPHINE SULFATE (PF) 2 MG/ML IV SOLN: 2.0000 mg | INTRAVENOUS | Status: DC | PRN

## 2019-03-03 MED ORDER — ALBUTEROL SULFATE (2.5 MG/3ML) 0.083% IN NEBU: 2.5000 mg | INHALATION_SOLUTION | RESPIRATORY_TRACT | Status: DC | PRN

## 2019-03-03 MED ORDER — APIXABAN 5 MG PO TABS: 5.0000 mg | ORAL_TABLET | Freq: Two times a day (BID) | ORAL | Status: DC

## 2019-03-03 MED ORDER — APIXABAN 5 MG PO TABS
5.0000 mg | ORAL_TABLET | Freq: Two times a day (BID) | ORAL | Status: DC
  Administered 2019-03-03: 5 mg via ORAL
  Filled 2019-03-03: qty 1

## 2019-03-03 MED ORDER — HEPARIN BOLUS VIA INFUSION
4000.0000 [IU] | Freq: Once | INTRAVENOUS | Status: AC
  Administered 2019-03-03: 4000 [IU] via INTRAVENOUS
  Filled 2019-03-03: qty 4000

## 2019-03-03 MED ORDER — MORPHINE SULFATE (PF) 4 MG/ML IV SOLN
4.0000 mg | Freq: Once | INTRAVENOUS | Status: AC
  Administered 2019-03-03: 4 mg via INTRAVENOUS
  Filled 2019-03-03: qty 1

## 2019-03-03 MED ORDER — METOPROLOL TARTRATE 50 MG PO TABS
50.0000 mg | ORAL_TABLET | Freq: Two times a day (BID) | ORAL | Status: DC
  Administered 2019-03-03: 50 mg via ORAL
  Filled 2019-03-03: qty 1

## 2019-03-03 MED ORDER — ASPIRIN EC 81 MG PO TBEC
81.0000 mg | DELAYED_RELEASE_TABLET | Freq: Every day | ORAL | Status: DC
  Administered 2019-03-03: 81 mg via ORAL
  Filled 2019-03-03: qty 1

## 2019-03-03 MED ORDER — AMIODARONE HCL IN DEXTROSE 360-4.14 MG/200ML-% IV SOLN: 30.0000 mg/h | INTRAVENOUS | Status: DC

## 2019-03-03 MED ORDER — ALUM & MAG HYDROXIDE-SIMETH 200-200-20 MG/5ML PO SUSP
30.0000 mL | Freq: Once | ORAL | Status: AC
  Administered 2019-03-03: 30 mL via ORAL
  Filled 2019-03-03: qty 30

## 2019-03-03 NOTE — ED Notes (Signed)
Heparin bolus verified by Jeannett Senior, RN.

## 2019-03-03 NOTE — Progress Notes (Signed)
*  PRELIMINARY RESULTS* Echocardiogram 2D Echocardiogram has been performed.  Garrel Ridgel Bindu Docter 03/03/2019, 12:40 PM

## 2019-03-03 NOTE — ED Notes (Signed)
Pt placed on external urinary cath for urination.

## 2019-03-03 NOTE — ED Notes (Signed)
Pt stating CP is coming back. Pt states currently CP is 10/10 in right chest. MD notified. No new orders at this time.

## 2019-03-03 NOTE — ED Notes (Signed)
Heparin verified by Tresa Endo, RN.

## 2019-03-03 NOTE — ED Notes (Addendum)
Date and time results received: 03/03/19 01:34AM  (use smartphrase ".now" to insert current time)  Test: Troponin  Critical Value: 15  Name of Provider Notified: Dr. Don Perking   Orders Received? Or Actions Taken?: No new orders at this time

## 2019-03-03 NOTE — Progress Notes (Signed)
ANTICOAGULATION CONSULT NOTE - Initial Consult  Pharmacy Consult for heparin Indication: chest pain/ACS  No Known Allergies  Patient Measurements: Height: 5\' 2"  (157.5 cm) Weight: 240 lb (108.9 kg) IBW/kg (Calculated) : 50.1 Heparin Dosing Weight: 74 kg  Vital Signs: Temp Source: Oral (01/02 2054) BP: 108/62 (01/03 0240) Pulse Rate: 76 (01/03 0240)  Labs: Recent Labs    03/02/19 2120 03/03/19 0059  HGB 14.5  --   HCT 46.8*  --   PLT 349  --   APTT 32  --   LABPROT 12.3  --   INR 0.9  --   HEPARINUNFRC 1.54*  --   CREATININE 1.39*  --   TROPONINIHS 22* 68*    Estimated Creatinine Clearance: 45.6 mL/min (A) (by C-G formula based on SCr of 1.39 mg/dL (H)).   Medical History: Past Medical History:  Diagnosis Date  . 2019 novel coronavirus detected 06/16/2018  . Asthma   . Bronchitis   . Diabetes mellitus without complication (HCC)   . HLD (hyperlipidemia) 06/16/2018  . HTN (hypertension) 06/16/2018  . Migraine   . T2DM (type 2 diabetes mellitus) (HCC) 06/16/2018    Medications:  Scheduled:  . amLODipine  10 mg Oral Daily  . aspirin EC  81 mg Oral Daily  . atorvastatin  40 mg Oral q1800  . diltiazem  10 mg Intravenous Once  . diltiazem  60 mg Oral Q8H  . heparin  4,000 Units Intravenous Once  . lisinopril  20 mg Oral Daily  . metoprolol tartrate  100 mg Oral BID    Assessment: Patient arrives w/ c/o CP w/ h/o HTN, HLD, DM, afib w/ RVR on eliquis PTA (last dose was 01/01), found to have trops 22 >> 68, initial EKG showing afib w/ RVR, and f/u EKG showing sinus tach. Patient is being started on heparin drip for ACS/NSTEMI. Baseline labs WNL, except heparin level 1.54.  Goal of Therapy:  APTT 66 - 102 seconds Heparin level 0.3-0.7 units/ml Monitor platelets by anticoagulation protocol: Yes   Plan:  Give 4000 units bolus x 1  Will start rate at 1000 units/hr. Will check aPTT at 0900, and will continue to dose off aPTT until aPTT/HL correlate. Will monitor  daily CBC's and adjust rate per aPTT.  03/01, PharmD, BCPS Clinical Pharmacist 03/03/2019,2:59 AM

## 2019-03-03 NOTE — Consult Note (Signed)
[facility] Cardiology  CARDIOLOGY CONSULT NOTE  Patient ID: Wendy Summers MRN: [id number] DOB/AGE: Nov 04, 1950 69 y.o.  Admit date: 03/02/2019 Referring Physician [facility] Primary Physician [facility] Primary Cardiologist [facility] Reason for Consultation atrial fibrillation  HPI: 69 year old female referred for evaluation of atrial fibrillation with rapid ventricular rate.  Patient has known history of paroxysmal atrial fibrillation and DVT currently on Eliquis for stroke prevention.  Patient presents to [facility] ED with chest pain and elevated heart rate.  ECG revealed atrial fibrillation with rapid ventricular rate at 200 bpm.  Patient was treated with IV amiodarone bolus and converted to sinus tachycardia at a rate of 120 bpm.  The patient is on amiodarone drip and remains in sinus rhythm.  She currently denies chest pain.  Admission labs notable for borderline elevated troponin ( 22, 68, 95 ).  Patient has a history of Covid-19 06/16/2018.  Review of systems complete and found to be negative unless listed above     Past Medical History:  Diagnosis Date  . 2019 novel coronavirus detected 06/16/2018  . Asthma   . Bronchitis   . Diabetes mellitus without complication (HCC)   . HLD (hyperlipidemia) 06/16/2018  . HTN (hypertension) 06/16/2018  . Migraine   . T2DM (type 2 diabetes mellitus) (HCC) 06/16/2018    Past Surgical History:  Procedure Laterality Date  . CHOLECYSTECTOMY    . NO PAST SURGERIES      (Not in a hospital admission)  Social History   Socioeconomic History  . Marital status: Married    Spouse name: Not on file  . Number of children: Not on file  . Years of education: Not on file  . Highest education level: Not on file  Occupational History  . Occupation: home maker  Tobacco Use  . Smoking status: Never Smoker  . Smokeless tobacco: Never Used  Substance and Sexual Activity  . Alcohol use: No  . Drug use: No  . Sexual activity: Not Currently  Other Topics  Concern  . Not on file  Social History Narrative  . Not on file   Social Determinants of Health   Financial Resource Strain:   . Difficulty of Paying Living Expenses: Not on file  Food Insecurity:   . Worried About Programme researcher, broadcasting/film/video in the Last Year: Not on file  . Ran Out of Food in the Last Year: Not on file  Transportation Needs:   . Lack of Transportation (Medical): Not on file  . Lack of Transportation (Non-Medical): Not on file  Physical Activity:   . Days of Exercise per Week: Not on file  . Minutes of Exercise per Session: Not on file  Stress:   . Feeling of Stress : Not on file  Social Connections:   . Frequency of Communication with Friends and Family: Not on file  . Frequency of Social Gatherings with Friends and Family: Not on file  . Attends Religious Services: Not on file  . Active Member of Clubs or Organizations: Not on file  . Attends Banker Meetings: Not on file  . Marital Status: Not on file  Intimate Partner Violence:   . Fear of Current or Ex-Partner: Not on file  . Emotionally Abused: Not on file  . Physically Abused: Not on file  . Sexually Abused: Not on file    Family History  Problem Relation Age of Onset  . Diabetes Mellitus II Mother   . Pancreatic cancer Sister  Review of systems complete and found to be negative unless listed above      PHYSICAL EXAM  General: Well developed, well nourished, in no acute distress HEENT:  Normocephalic and atramatic Neck:  No JVD.  Lungs: Clear bilaterally to auscultation and percussion. Heart: HRRR . Normal S1 and S2 without gallops or murmurs.  Abdomen: Bowel sounds are positive, abdomen soft and non-tender  Msk:  Back normal, normal gait. Normal strength and tone for age. Extremities: No clubbing, cyanosis or edema.   Neuro: Alert and oriented X 3. Psych:  Good affect, responds appropriately  Labs:   Lab Results  Component Value Date   WBC 14.1 (H) 03/02/2019   HGB 14.5  03/02/2019   HCT 46.8 (H) 03/02/2019   MCV 96.1 03/02/2019   PLT 349 03/02/2019    Recent Labs  Lab 03/02/19 2120  NA 138  K 4.3  CL 101  CO2 22  BUN 28*  CREATININE 1.39*  CALCIUM 9.8  PROT 7.9  BILITOT 0.4  ALKPHOS 99  ALT 27  AST 31  GLUCOSE 329*   Lab Results  Component Value Date   TROPONINI <0.03 06/16/2018    Lab Results  Component Value Date   CHOL 247 (H) 03/02/2019   Lab Results  Component Value Date   HDL 63 03/02/2019   Lab Results  Component Value Date   LDLCALC 118 (H) 03/02/2019   Lab Results  Component Value Date   TRIG 328 (H) 03/02/2019   Lab Results  Component Value Date   CHOLHDL 3.9 03/02/2019   No results found for: LDLDIRECT    Radiology: CT Angio Chest PE W and/or Wo Contrast  Result Date: 03/03/2019 CLINICAL DATA:  Shortness of breath chest pain EXAM: CT ANGIOGRAPHY CHEST WITH CONTRAST TECHNIQUE: Multidetector CT imaging of the chest was performed using the standard protocol during bolus administration of intravenous contrast. Multiplanar CT image reconstructions and MIPs were obtained to evaluate the vascular anatomy. CONTRAST:  32mL OMNIPAQUE IOHEXOL 350 MG/ML SOLN COMPARISON:  Chest x-ray 06/15/2018, CT chest 08/03/2006 FINDINGS: Cardiovascular: Satisfactory opacification of the pulmonary arteries to the segmental level. No evidence of pulmonary embolism. Nonaneurysmal aorta. Mild coronary vascular calcification. Mild cardiomegaly. No pericardial effusion. Mediastinum/Nodes: Midline trachea. No thyroid mass. Subcentimeter mediastinal lymph nodes. Esophagus within normal limits. Lungs/Pleura: Lungs are clear. No pleural effusion or pneumothorax. Upper Abdomen: No acute abnormality. Musculoskeletal: Degenerative changes. No acute or suspicious osseous abnormality Review of the MIP images confirms the above findings. IMPRESSION: 1. Negative for acute pulmonary embolus. 2. Clear lung fields. Aortic Atherosclerosis (ICD10-I70.0). Electronically  Signed   By: Donavan Foil M.D.   On: 03/03/2019 00:31   NM Myocar Multi W/Spect W/Wall Motion / EF  Result Date: 02/26/2019  Blood pressure demonstrated a blunted response to exercise.  There was no ST segment deviation noted during stress.  No T wave inversion was noted during stress.  The study is normal.  This is a low risk study.  The left ventricular ejection fraction is hyperdynamic (>65%).  Nuclear stress EF: 75%.  Adequate chemical stress Await Myoview images    EKG: Atrial fibrillation at 200 bpm  ASSESSMENT AND PLAN:   1.  Atrial fibrillation with rapid ventricular rate, converted to sinus rhythm with amiodarone bolus, with history of paroxysmal atrial fibrillation, on Eliquis for stroke prevention 2.  Borderline elevated troponin, in the setting of atrial fibrillation with rapid ventricular rate, without acute ischemic ST-T wave changes, currently chest pain-free, likely demand supply ischemia  Recommendations  1.  Agree with overall current therapy 2.  DC heparin 3.  Resume Eliquis 5 mg twice daily for stroke prevention 4.  Transition from amiodarone drip to amiodarone 200 mg twice daily 5.  Review 2D echocardiogram 6.  Continue metoprolol tartrate 50 mg twice daily 7.  Consider discharge home later today 8.  Patient reports she has follow-up appointment with Dr. Juliann Pares 03/06/2019    Signed: Marcina Millard MD,PhD, [facility] 03/03/2019, 10:12 AM

## 2019-03-03 NOTE — Discharge Summary (Signed)
[facility] [place] NAME: Wendy Summers    MR#:  [id number]  DATE OF BIRTH:  28-May-1950  DATE OF ADMISSION:  03/02/2019 ADMITTING PHYSICIAN: Christel Mormon, MD  DATE OF DISCHARGE: 03/03/2019  PRIMARY CARE PHYSICIAN: [facility], [place]    ADMISSION DIAGNOSIS:  NSVT (nonsustained ventricular tachycardia) (HCC) [I47.2]  DISCHARGE DIAGNOSIS:  rapid a fib with RVR--- with history of paroxysmal a fib  SECONDARY DIAGNOSIS:   Past Medical History:  Diagnosis Date  . 2019 novel coronavirus detected 06/16/2018  . Asthma   . Bronchitis   . Diabetes mellitus without complication ([place])   . HLD (hyperlipidemia) 06/16/2018  . HTN (hypertension) 06/16/2018  . Migraine   . T2DM (type 2 diabetes mellitus) ([place]) 06/16/2018    HOSPITAL COURSE:   Wendy Summers  is a 69 y.o. Caucasian female with a known history of hypertension, dyslipidemia, migraine and asthma as well as type 2 diabetes mellitus, who presented to the emergency room with onset of substernal chest pain felt as soreness and sharp pain with mild palpitations. pt was found to be in rapid a fib  1.  Atrial fibrillation with rapid ventricular response.  -Patient was given bolus of IV amiodarone and then started on IV amiodarone drip.  - Cardiology consultation  With dr Josefa Half appreciated. Recommends switch to oral amiodarone 200 BID continue eliquis for stroke prevention continue metoprolol and follow-up with Dr. Clayborn Bigness on January 6 scheduled appointment  -and 2D echo results pending will have it be followed next week with cardiology  -The patient's CHA2DS2-VASc score is 4.  -Patient already is on eliquis  2.  Chest pain, rule out acute coronary syndrome.  This is likely secondary to #1.  -Serial troponins mild elevation secondary to demand ischemia. No further workup for by cardiology recommended -continue her cardiac meds  3.  Hypertension.   -continue amlodipine and Zestril as  well as Lopressor.   4.  Type 2 diabetes mellitus.   -resume metformin and glipizide Glucotrol. Follow-up blood sugars with primary care physician.  5.  DVT prophylaxis.  -on eliquis  Overall hemodynamically stable. Okay to discharge from cardiology standpoint. Patient agreeable. CONSULTS OBTAINED:  Treatment Team:  Isaias Cowman, MD  DRUG ALLERGIES:  No Known Allergies  DISCHARGE MEDICATIONS:   Allergies as of 03/03/2019   No Known Allergies     Medication List    STOP taking these medications   diltiazem 60 MG tablet Commonly known as: CARDIZEM     TAKE these medications   albuterol (2.5 MG/3ML) 0.083% nebulizer solution Commonly known as: PROVENTIL Take 2.5 mg by nebulization every 4 (four) hours as needed for wheezing or shortness of breath.   amiodarone 200 MG tablet Commonly known as: PACERONE Take 1 tablet (200 mg total) by mouth 2 (two) times daily.   amLODipine 10 MG tablet Commonly known as: NORVASC Take 10 mg by mouth daily.   apixaban 5 MG Tabs tablet Commonly known as: ELIQUIS Take 5 mg by mouth 2 (two) times daily.   atorvastatin 40 MG tablet Commonly known as: LIPITOR Take 1 tablet (40 mg total) by mouth daily.   glipiZIDE 10 MG tablet Commonly known as: GLUCOTROL Take 10 mg by mouth daily.   lisinopril 20 MG tablet Commonly known as: ZESTRIL Take 20 mg by mouth daily.   metFORMIN 500 MG 24 hr tablet Commonly known as: GLUCOPHAGE-XR Take 2,000 mg by mouth daily.   metoprolol tartrate 100 MG tablet  Commonly known as: LOPRESSOR Take 100 mg by mouth 2 (two) times daily.   Simbrinza 1-0.2 % Susp Generic drug: Brinzolamide-Brimonidine Apply 1 drop to eye 3 (three) times daily.       If you experience worsening of your admission symptoms, develop shortness of breath, life threatening emergency, suicidal or homicidal thoughts you must seek medical attention immediately by calling 911 or calling your MD immediately  if symptoms  less severe.  You Must read complete instructions/literature along with all the possible adverse reactions/side effects for all the Medicines you take and that have been prescribed to you. Take any new Medicines after you have completely understood and accept all the possible adverse reactions/side effects.   Please note  You were cared for by a hospitalist during your hospital stay. If you have any questions about your discharge medications or the care you received while you were in the hospital after you are discharged, you can call the unit and asked to speak with the hospitalist on call if the hospitalist that took care of you is not available. Once you are discharged, your primary care physician will handle any further medical issues. Please note that NO REFILLS for any discharge medications will be authorized once you are discharged, as it is imperative that you return to your primary care physician (or establish a relationship with a primary care physician if you do not have one) for your aftercare needs so that they can reassess your need for medications and monitor your lab values. Today   SUBJECTIVE   Feeling hungry. Denies any palpitations today. Chest pain resolved.  VITAL SIGNS:  Blood pressure 129/70, pulse 72, resp. rate 17, height 5\' 2"  (1.575 m), weight 108.9 kg, SpO2 93 %.  I/O:  No intake or output data in the 24 hours ending 03/03/19 1417  PHYSICAL EXAMINATION:  GENERAL:  69 y.o.-year-old patient lying in the bed with no acute distress. Obese EYES: Pupils equal, round, reactive to light and accommodation. No scleral icterus. Extraocular muscles intact.  HEENT: Head atraumatic, normocephalic. Oropharynx and nasopharynx clear.  NECK:  Supple, no jugular venous distention. No thyroid enlargement, no tenderness.  LUNGS: Normal breath sounds bilaterally, no wheezing, rales,rhonchi or crepitation. No use of accessory muscles of respiration.  CARDIOVASCULAR: S1, S2 normal. No  murmurs, rubs, or gallops.  ABDOMEN: Soft, non-tender, non-distended. Bowel sounds present. No organomegaly or mass.  EXTREMITIES: No pedal edema, cyanosis, or clubbing.  NEUROLOGIC: Cranial nerves II through XII are intact. Muscle strength 5/5 in all extremities. Sensation intact. Gait not checked.  PSYCHIATRIC: The patient is alert and oriented x 3.  SKIN: No obvious rash, lesion, or ulcer.   DATA REVIEW:   CBC  Recent Labs  Lab 03/02/19 2120  WBC 14.1*  HGB 14.5  HCT 46.8*  PLT 349    Chemistries  Recent Labs  Lab 03/02/19 2120  NA 138  K 4.3  CL 101  CO2 22  GLUCOSE 329*  BUN 28*  CREATININE 1.39*  CALCIUM 9.8  MG 2.0  AST 31  ALT 27  ALKPHOS 99  BILITOT 0.4    Microbiology Results   Recent Results (from the past 240 hour(s))  Respiratory Panel by RT PCR (Flu A&B, Covid) - Nasopharyngeal Swab     Status: None   Collection Time: 03/03/19  1:32 AM   Specimen: Nasopharyngeal Swab  Result Value Ref Range Status   SARS Coronavirus 2 by RT PCR NEGATIVE NEGATIVE Final    Comment: (NOTE) SARS-CoV-2 target  nucleic acids are NOT DETECTED. The SARS-CoV-2 RNA is generally detectable in upper respiratoy specimens during the acute phase of infection. The lowest concentration of SARS-CoV-2 viral copies this assay can detect is 131 copies/mL. A negative result does not preclude SARS-Cov-2 infection and should not be used as the sole basis for treatment or other patient management decisions. A negative result may occur with  improper specimen collection/handling, submission of specimen other than nasopharyngeal swab, presence of viral mutation(s) within the areas targeted by this assay, and inadequate number of viral copies (<131 copies/mL). A negative result must be combined with clinical observations, patient history, and epidemiological information. The expected result is Negative. Fact Sheet for Patients:  https://www.moore.com/ Fact Sheet for  Healthcare Providers:  https://www.young.biz/ This test is not yet ap proved or cleared by the [place] FDA and  has been authorized for detection and/or diagnosis of SARS-CoV-2 by FDA under an Emergency Use Authorization (EUA). This EUA will remain  in effect (meaning this test can be used) for the duration of the COVID-19 declaration under Section 564(b)(1) of the Act, 21 U.S.C. section 360bbb-3(b)(1), unless the authorization is terminated or revoked sooner.    Influenza A by PCR NEGATIVE NEGATIVE Final   Influenza B by PCR NEGATIVE NEGATIVE Final    Comment: (NOTE) The Xpert Xpress SARS-CoV-2/FLU/RSV assay is intended as an aid in  the diagnosis of influenza from Nasopharyngeal swab specimens and  should not be used as a sole basis for treatment. Nasal washings and  aspirates are unacceptable for Xpert Xpress SARS-CoV-2/FLU/RSV  testing. Fact Sheet for Patients: https://www.moore.com/ Fact Sheet for Healthcare Providers: https://www.young.biz/ This test is not yet approved or cleared by the [place] FDA and  has been authorized for detection and/or diagnosis of SARS-CoV-2 by  FDA under an Emergency Use Authorization (EUA). This EUA will remain  in effect (meaning this test can be used) for the duration of the  Covid-19 declaration under Section 564(b)(1) of the Act, 21  U.S.C. section 360bbb-3(b)(1), unless the authorization is  terminated or revoked. Performed at [facility], [address] [place], [place] [address]     RADIOLOGY:  CT Angio Chest PE W and/or Wo Contrast  Result Date: 03/03/2019 CLINICAL DATA:  Shortness of breath chest pain EXAM: CT ANGIOGRAPHY CHEST WITH CONTRAST TECHNIQUE: Multidetector CT imaging of the chest was performed using the standard protocol during bolus administration of intravenous contrast. Multiplanar CT image reconstructions and MIPs were obtained to evaluate the  vascular anatomy. CONTRAST:  70mL OMNIPAQUE IOHEXOL 350 MG/ML SOLN COMPARISON:  Chest x-ray 06/15/2018, CT chest 08/03/2006 FINDINGS: Cardiovascular: Satisfactory opacification of the pulmonary arteries to the segmental level. No evidence of pulmonary embolism. Nonaneurysmal aorta. Mild coronary vascular calcification. Mild cardiomegaly. No pericardial effusion. Mediastinum/Nodes: Midline trachea. No thyroid mass. Subcentimeter mediastinal lymph nodes. Esophagus within normal limits. Lungs/Pleura: Lungs are clear. No pleural effusion or pneumothorax. Upper Abdomen: No acute abnormality. Musculoskeletal: Degenerative changes. No acute or suspicious osseous abnormality Review of the MIP images confirms the above findings. IMPRESSION: 1. Negative for acute pulmonary embolus. 2. Clear lung fields. Aortic Atherosclerosis (ICD10-I70.0). Electronically Signed   By: Jasmine Pang M.D.   On: 03/03/2019 00:31     CODE STATUS:     Code Status Orders  (From admission, onward)         Start     Ordered   03/03/19 0115  Full code  Continuous     03/03/19 0118  Code Status History    Date Active Date Inactive Code Status Order ID Comments User Context   06/16/2018 2258 06/22/2018 1854 Full Code [id number]  Delaine Lame, MD Inpatient   06/15/2018 2352 06/16/2018 2145 Full Code [id number]  Oralia Manis, MD Inpatient   04/21/2018 0348 04/23/2018 1942 Full Code [id number]  Arnaldo Natal, MD Inpatient   02/25/2018 2225 03/02/2018 1922 Full Code [id number]  Auburn Bilberry, MD Inpatient   07/13/2016 0651 07/18/2016 1555 Full Code [id number]  Ihor Austin, MD Inpatient   Advance Care Planning Activity       TOTAL TIME TAKING CARE OF THIS PATIENT: *40* minutes.    Enedina Finner M.D on 03/03/2019 at 2:17 PM  Between 7am to 6pm - Pager - [phone] After 6pm go to [email or web address] - password [email or web address]  [facility]    CC: Primary care physician; [facility], [employer]

## 2019-03-03 NOTE — H&P (Signed)
[place] at [facility]   PATIENT NAME: Wendy Summers    MR#:  [id number]  DATE OF BIRTH:  1950/05/17  DATE OF ADMISSION:  03/02/2019  PRIMARY CARE PHYSICIAN: [facility]   REQUESTING/REFERRING PHYSICIAN: Cecil Cobbs, MD  CHIEF COMPLAINT:   Chief Complaint  Patient presents with  . Chest Pain    HISTORY OF PRESENT ILLNESS:  Wendy Summers  is a 69 y.o. Caucasian female with a known history of hypertension, dyslipidemia, migraine and asthma as well as type 2 diabetes mellitus, who presented to the emergency room with onset of substernal chest pain felt as soreness and sharp pain with no palpitations.  She denied any dyspnea or leg pain or edema or recent travels or surgeries.  She admits to mild headache.  She denied any fever or chills.  No cough or wheezing no recent exposure to COVID-19.  Upon presentation to the emergency room, blood pressure was 135/112 with a heart rate of 245 and respiratory to 31 pulse 79 5% on room air.  Labs revealed hyperglycemia with a blood glucose of 329, BUN of 28 and creatinine 1.39 comparable to April 2020, and BNP of 61.  High-sensitivity troponin I was 22 and later 68.  CBC showed leukocytosis of 14.1 with neutrophilia.  Influenza A and B antigens came back negative and COVID-19 antigen was negative as well.  EKG initially showed atrial fibrillation with rapid response of 220 with PVCs and after being giving IV amiodarone bolus and drip converted to sinus tachycardia with rate of 120 with left axis deviation on a repeat EKG and then normal sinus rhythm with rate of 75 with left axis deviation and low voltage QRS.  The patient was also given 10 mg of IV Cardizem initially as well as 4 mg of IV morphine sulfate and 1 L bolus of IV normal saline.  She will be admitted to an observation telemetry bed for further evaluation and management. PAST MEDICAL HISTORY:   Past Medical History:  Diagnosis Date  . 2019 novel  coronavirus detected 06/16/2018  . Asthma   . Bronchitis   . Diabetes mellitus without complication (HCC)   . HLD (hyperlipidemia) 06/16/2018  . HTN (hypertension) 06/16/2018  . Migraine   . T2DM (type 2 diabetes mellitus) (HCC) 06/16/2018    PAST SURGICAL HISTORY:   Past Surgical History:  Procedure Laterality Date  . CHOLECYSTECTOMY    . NO PAST SURGERIES      SOCIAL HISTORY:   Social History   Tobacco Use  . Smoking status: Never Smoker  . Smokeless tobacco: Never Used  Substance Use Topics  . Alcohol use: No    FAMILY HISTORY:   Family History  Problem Relation Age of Onset  . Diabetes Mellitus II Mother   . Pancreatic cancer Sister     DRUG ALLERGIES:  No Known Allergies  REVIEW OF SYSTEMS:   ROS As per history of present illness. All pertinent systems were reviewed above. Constitutional,  HEENT, cardiovascular, respiratory, GI, GU, musculoskeletal, neuro, psychiatric, endocrine,  integumentary and hematologic systems were reviewed and are otherwise  negative/unremarkable except for positive findings mentioned above in the HPI.   MEDICATIONS AT HOME:   Prior to Admission medications   Medication Sig Start Date End Date Taking? Authorizing Provider  albuterol (PROVENTIL) (2.5 MG/3ML) 0.083% nebulizer solution Take 2.5 mg by nebulization every 4 (four) hours as needed for wheezing or shortness of breath.   Yes [provider]  amLODipine (NORVASC)  10 MG tablet Take 10 mg by mouth daily. 01/01/19  Yes [provider]  apixaban (ELIQUIS) 5 MG TABS tablet Take 5 mg by mouth 2 (two) times daily.    Yes [provider]  atorvastatin (LIPITOR) 40 MG tablet Take 1 tablet (40 mg total) by mouth daily. 06/16/18  Yes Mody, Sital, MD  glipiZIDE (GLUCOTROL) 10 MG tablet Take 10 mg by mouth daily. 01/01/19  Yes [provider]  lisinopril (ZESTRIL) 20 MG tablet Take 20 mg by mouth daily. 01/01/19  Yes [provider]  metFORMIN  (GLUCOPHAGE-XR) 500 MG 24 hr tablet Take 2,000 mg by mouth daily. 01/18/18  Yes [provider]  metoprolol tartrate (LOPRESSOR) 100 MG tablet Take 100 mg by mouth 2 (two) times daily. 04/08/18  Yes [provider]  SIMBRINZA 1-0.2 % SUSP Apply 1 drop to eye 3 (three) times daily.  04/08/18  Yes [provider]  diltiazem (CARDIZEM) 60 MG tablet Take 1 tablet (60 mg total) by mouth every 8 (eight) hours. Patient not taking: Reported on 03/03/2019 06/16/18   Adrian Saran, MD      VITAL SIGNS:  Blood pressure 108/62, pulse 76, resp. rate 16, height 5\' 2"  (1.575 m), weight 108.9 kg, SpO2 98 %.  PHYSICAL EXAMINATION:  Physical Exam  GENERAL:  69 y.o.-year-old Caucasian female patient lying in the bed with no acute distress.  EYES: Pupils equal, round, reactive to light and accommodation. No scleral icterus. Extraocular muscles intact.  HEENT: Head atraumatic, normocephalic. Oropharynx and nasopharynx clear.  NECK:  Supple, no jugular venous distention. No thyroid enlargement, no tenderness.  LUNGS: Normal breath sounds bilaterally, no wheezing, rales,rhonchi or crepitation. No use of accessory muscles of respiration.  CARDIOVASCULAR: Regular rate and rhythm, S1, S2 normal. No murmurs, rubs, or gallops.  ABDOMEN: Soft, nondistended, nontender. Bowel sounds present. No organomegaly or mass.  EXTREMITIES: No pedal edema, cyanosis, or clubbing.  NEUROLOGIC: Cranial nerves II through XII are intact. Muscle strength 5/5 in all extremities. Sensation intact. Gait not checked.  PSYCHIATRIC: The patient is alert and oriented x 3.  Normal affect and good eye contact. SKIN: No obvious rash, lesion, or ulcer.   LABORATORY PANEL:   CBC Recent Labs  Lab 03/02/19 2120  WBC 14.1*  HGB 14.5  HCT 46.8*  PLT 349   ------------------------------------------------------------------------------------------------------------------  Chemistries  Recent Labs  Lab 03/02/19 2120  NA  138  K 4.3  CL 101  CO2 22  GLUCOSE 329*  BUN 28*  CREATININE 1.39*  CALCIUM 9.8  MG 2.0  AST 31  ALT 27  ALKPHOS 99  BILITOT 0.4   ------------------------------------------------------------------------------------------------------------------  Cardiac Enzymes No results for input(s): TROPONINI in the last 168 hours. ------------------------------------------------------------------------------------------------------------------  RADIOLOGY:  CT Angio Chest PE W and/or Wo Contrast  Result Date: 03/03/2019 CLINICAL DATA:  Shortness of breath chest pain EXAM: CT ANGIOGRAPHY CHEST WITH CONTRAST TECHNIQUE: Multidetector CT imaging of the chest was performed using the standard protocol during bolus administration of intravenous contrast. Multiplanar CT image reconstructions and MIPs were obtained to evaluate the vascular anatomy. CONTRAST:  97mL OMNIPAQUE IOHEXOL 350 MG/ML SOLN COMPARISON:  Chest x-ray 06/15/2018, CT chest 08/03/2006 FINDINGS: Cardiovascular: Satisfactory opacification of the pulmonary arteries to the segmental level. No evidence of pulmonary embolism. Nonaneurysmal aorta. Mild coronary vascular calcification. Mild cardiomegaly. No pericardial effusion. Mediastinum/Nodes: Midline trachea. No thyroid mass. Subcentimeter mediastinal lymph nodes. Esophagus within normal limits. Lungs/Pleura: Lungs are clear. No pleural effusion or pneumothorax. Upper Abdomen: No acute abnormality. Musculoskeletal:  Degenerative changes. No acute or suspicious osseous abnormality Review of the MIP images confirms the above findings. IMPRESSION: 1. Negative for acute pulmonary embolus. 2. Clear lung fields. Aortic Atherosclerosis (ICD10-I70.0). Electronically Signed   By: Donavan Foil M.D.   On: 03/03/2019 00:31      IMPRESSION AND PLAN:   1.  Atrial fibrillation with rapid ventricular response.  The patient will be admitted to an observation telemetry bed.  We will continue IV amiodarone drip.   Cardiology consultation and 2D echo will be obtained this a.m.  I notified Dr. Saralyn Pilar about the patient.  The patient's CHA2DS2-VASc score is 4.  We will continue IV heparin infusion.  She will need long-term anticoagulation.  We will hold off p.o. Cardizem for now.  2.  Chest pain, rule out acute coronary syndrome.  This is likely secondary to #1.  Will follow serial troponins.  The echocardiac consult will be obtained as mentioned above.  We will continue aspirin.  The patient is placed on IV heparin infusion for now and will follow serial troponins.  3.  Hypertension.  We will continue amlodipine and Zestril as well as Lopressor.  We will hold off p.o. Cardizem for now.  4.  Type 2 diabetes mellitus.  Metformin will be held off and the patient will be placed on supplemental coverage with NovoLog.  We will hold p.o. Glucotrol for now.  5.  DVT prophylaxis.  The patient will be on IV heparin.   All the records are reviewed and case discussed with ED provider. The plan of care was discussed in details with the patient (and family). I answered all questions. The patient agreed to proceed with the above mentioned plan. Further management will depend upon hospital course.   CODE STATUS: Full code  TOTAL TIME TAKING CARE OF THIS PATIENT:55 minutes.    Christel Mormon M.D on 03/03/2019 at 3:06 AM  [facility]   From 7 PM-7 AM, contact night-coverage [email or web address]  CC: Primary care physician; [employer], [employer]   Note: This dictation was prepared with Diplomatic Services operational officer dictation along with smaller Company secretary. Any transcriptional errors that result from this process are unintentional.
